# Patient Record
Sex: Female | Born: 1990 | Race: Black or African American | Hispanic: No | Smoking: Never smoker
Health system: Southern US, Community
[De-identification: ages and names within clinical notes are randomized; demographics above are authoritative.]

## PROBLEM LIST (undated history)

## (undated) DIAGNOSIS — F1210 Cannabis abuse, uncomplicated: Secondary | ICD-10-CM

## (undated) DIAGNOSIS — R569 Unspecified convulsions: Secondary | ICD-10-CM

## (undated) DIAGNOSIS — G43019 Migraine without aura, intractable, without status migrainosus: Secondary | ICD-10-CM

## (undated) DIAGNOSIS — R51 Headache: Secondary | ICD-10-CM

## (undated) DIAGNOSIS — R519 Headache, unspecified: Secondary | ICD-10-CM

## (undated) HISTORY — DX: Migraine without aura, intractable, without status migrainosus: G43.019

## (undated) HISTORY — DX: Unspecified convulsions: R56.9

---
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
## 2011-08-30 LAB — URINALYSIS, ROUTINE W REFLEX MICROSCOPIC
Glucose, UA: NEGATIVE
Leukocytes, UA: NEGATIVE
Protein, ur: NEGATIVE
Specific Gravity, Urine: 1.02
pH: 7

## 2011-08-30 LAB — CBC
HCT: 35.7
Hemoglobin: 12
RBC: 4.25
WBC: 5.1

## 2011-08-30 LAB — DIFFERENTIAL
Basophils Absolute: 0
Eosinophils Relative: 1
Lymphocytes Relative: 40
Lymphs Abs: 2
Monocytes Absolute: 0.5
Neutro Abs: 2.4

## 2011-08-30 LAB — URINE MICROSCOPIC-ADD ON

## 2011-08-30 LAB — BASIC METABOLIC PANEL
Calcium: 9.6
Potassium: 3.9
Sodium: 135

## 2013-12-09 ENCOUNTER — Encounter: Payer: Self-pay | Admitting: Clinical

## 2013-12-09 NOTE — Progress Notes (Signed)
A user error has taken place: encounter opened in error, closed for administrative reasons.

## 2014-03-26 ENCOUNTER — Encounter (HOSPITAL_COMMUNITY): Payer: Self-pay | Admitting: Emergency Medicine

## 2014-03-26 ENCOUNTER — Emergency Department (HOSPITAL_COMMUNITY)
Admission: EM | Admit: 2014-03-26 | Discharge: 2014-03-26 | Disposition: A | Discharge status: Home / Self Care | Payer: Self-pay | Attending: Emergency Medicine | Admitting: Emergency Medicine

## 2014-03-26 DIAGNOSIS — J040 Acute laryngitis: Secondary | ICD-10-CM | POA: Insufficient documentation

## 2014-03-26 DIAGNOSIS — Z791 Long term (current) use of non-steroidal anti-inflammatories (NSAID): Secondary | ICD-10-CM | POA: Insufficient documentation

## 2014-03-26 MED ORDER — PREDNISONE 20 MG PO TABS: 40.0000 mg | ORAL_TABLET | Freq: Every day | ORAL | Status: DC

## 2014-03-26 MED ORDER — PREDNISONE 20 MG PO TABS
60.0000 mg | ORAL_TABLET | Freq: Once | ORAL | Status: AC
  Administered 2014-03-26: 60 mg via ORAL
  Filled 2014-03-26: qty 3

## 2014-03-26 NOTE — Discharge Instructions (Signed)
Perform voice rest and stay well hydrated.   Do not hesitate to return to the Emergency Department for any new, worsening or concerning symptoms.   If you do not have a primary care doctor you can establish one at the   [facility]: [address] [place] [id number] [phone]  After you establish care. Let them know you were seen in the emergency room. They must obtain records for further management.    Laryngitis At the top of your windpipe is your voice box. It is the source of your voice. Inside your voice box are 2 bands of muscles called vocal cords. When you breathe, your vocal cords are relaxed and open so that air can get into the lungs. When you decide to say something, these cords come together and vibrate. The sound from these vibrations goes into your throat and comes out through your mouth as sound. Laryngitis is an inflammation of the vocal cords that causes hoarseness, cough, loss of voice, sore throat, and dry throat. Laryngitis can be temporary (acute) or long-term (chronic). Most cases of acute laryngitis improve with time.Chronic laryngitis lasts for more than 3 weeks. CAUSES Laryngitis can often be related to excessive smoking, talking, or yelling, as well as inhalation of toxic fumes and allergies. Acute laryngitis is usually caused by a viral infection, vocal strain, measles or mumps, or bacterial infections. Chronic laryngitis is usually caused by vocal cord strain, vocal cord injury, postnasal drip, growths on the vocal cords, or acid reflux. SYMPTOMS   Cough.  Sore throat.  Dry throat. RISK FACTORS  Respiratory infections.  Exposure to irritating substances, such as cigarette smoke, excessive amounts of alcohol, stomach acids, and workplace chemicals.  Voice trauma, such as vocal cord injury from shouting or speaking too loud. DIAGNOSIS  Your cargiver will perform a physical exam. During the physical exam, your caregiver will examine  your throat. The most common sign of laryngitis is hoarseness. Laryngoscopy may be necessary to confirm the diagnosis of this condition. This procedure allows your caregiver to look into the larynx. HOME CARE INSTRUCTIONS  Drink enough fluids to keep your urine clear or pale yellow.  Rest until you no longer have symptoms or as directed by your caregiver.  Breathe in moist air.  Take all medicine as directed by your caregiver.  Do not smoke.  Talk as little as possible (this includes whispering).  Write on paper instead of talking until your voice is back to normal.  Follow up with your caregiver if your condition has not improved after 10 days. SEEK MEDICAL CARE IF:   You have trouble breathing.  You cough up blood.  You have persistent fever.  You have increasing pain.  You have difficulty swallowing. MAKE SURE YOU:  Understand these instructions.  Will watch your condition.  Will get help right away if you are not doing well or get worse. Document Released: 11/07/2005 Document Revised: 01/30/2012 Document Reviewed: 01/13/2011 [facility] Patient Information 2014 [place] [place].

## 2014-03-26 NOTE — ED Notes (Signed)
Hoarse for 3-4 days.  No pain when eating no cold or cough.  No distress

## 2014-03-26 NOTE — ED Provider Notes (Signed)
CSN: [id number]     Arrival date & time 03/26/14  1753 History   First MD Initiated Contact with Patient 03/26/14 1802 This chart was scribed for non-physician practitioner Wynetta EmeryNicole Rogelio Winbush, PA-C working with Merrie RoofJohn David Wofford III, * by Valera CastleSteven Perry, ED scribe. This patient was seen in room TR05C/TR05C and the patient's care was started at 6:39 PM.     Chief Complaint  Patient presents with  . Hoarse   (Consider location/radiation/quality/duration/timing/severity/associated sxs/prior Treatment) The history is provided by the patient. No language interpreter was used.   HPI Comments: Michelle Short is a 23 y.o. female who presents to the Emergency Department complaining of gradual loss of voice, onset 3 days ago. She reports associated soreness to the sides of her throat. 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 She denies h/o similar symptoms. She denies being on any daily medications. She denies cough, fever, rhinorrhea, and any other associated symptoms.   PCP - No primary provider on file.  History reviewed. No pertinent past medical history. History reviewed. No pertinent past surgical history. No family history on file. History  Substance Use Topics  . Smoking status: Never Smoker   . Smokeless tobacco: Not on file  . Alcohol Use: Yes   OB History   Grav Para Term Preterm Abortions TAB SAB Ect Mult Living                 Review of Systems  Constitutional: Negative for fever.  HENT: Positive for sore throat (bilateral soreness) and voice change (loss of voice). Negative for rhinorrhea.   Respiratory: Negative for cough.    Allergies  Review of patient's allergies indicates no known allergies.  Home Medications   Prior to Admission medications   Medication Sig Start Date End Date Taking? Authorizing Provider  naproxen sodium (ANAPROX) 220 MG tablet Take 440 mg by mouth 2 (two) times daily with a meal.   Yes Historical Provider, MD   BP 122/75  Pulse 90  Temp(Src) 98.3  F (36.8 C) (Oral)  Resp 20  SpO2 97%  LMP 03/19/2014  Physical Exam  Nursing note and vitals reviewed. Constitutional: She is oriented to person, place, and time. She appears well-developed and well-nourished. No distress.  HENT:  Head: Normocephalic and atraumatic.  Mouth/Throat: Oropharynx is clear and moist.  Mild erythema, no tonsillar hypertrophy or exudate, soft palate rises symmetrically. Uvula is midline.  Eyes: Conjunctivae and EOM are normal. Pupils are equal, round, and reactive to light.  Neck: Normal range of motion. Neck supple.  Shotty, bilateral, nontender lymphadenopathy in the anterior cervical regions  Cardiovascular: Normal rate, regular rhythm and intact distal pulses.   Pulmonary/Chest: Effort normal. No respiratory distress. She has no wheezes. She has no rales. She exhibits no tenderness.  Abdominal: Soft. She exhibits no distension and no mass. There is no tenderness. There is no rebound and no guarding.  Musculoskeletal: Normal range of motion.  Lymphadenopathy:    She has cervical adenopathy.  Neurological: She is alert and oriented to person, place, and time.  Skin: Skin is warm and dry.  Psychiatric: She has a normal mood and affect. Her behavior is normal.   ED Course  Procedures (including critical care time)  DIAGNOSTIC STUDIES: Oxygen Saturation is 97% on room air, normal by my interpretation.    COORDINATION OF CARE: 6:41 PM-[facility] and pt agreed to plan. Will give pt note for work, but  strongly advised pt to refrain from speaking until symptoms resolve.   Labs Review Labs Reviewed - No data to display  Imaging Review No results found.   EKG Interpretation None     Medications - No data to display MDM   Final diagnoses:  None    Filed Vitals:   03/26/14 1800  BP: 122/75  Pulse: 90  Temp: 98.3 F (36.8 C)  TempSrc: Oral  Resp: 20  SpO2: 97%     Medications  predniSONE (DELTASONE) tablet 60 mg (60 mg Oral Given 03/26/14 1853)    Michelle Short is a 23 y.o. female presenting with laryngitis. 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 Advised strict voice rest, aggressive hydration and will do prednisone burst as well.  Evaluation does not show pathology that would require ongoing emergent intervention or inpatient treatment. Pt is hemodynamically stable and mentating appropriately. Discussed findings and plan with patient/guardian, who agrees with care plan. All questions answered. Return precautions discussed and outpatient follow up given.   Discharge Medication List as of 03/26/2014  6:49 PM    START taking these medications   Details  predniSONE (DELTASONE) 20 MG tablet Take 2 tablets (40 mg total) by mouth daily., Starting 03/26/2014, Until Discontinued, Print        Note: Portions of this report may have been transcribed using voice recognition software. Every effort was made to ensure accuracy; however, inadvertent computerized transcription errors may be present   I personally performed the services described in this documentation, which was scribed in my presence. The recorded information has been reviewed and is accurate.    Wynetta Emeryicole Jamar Casagrande, PA-C 03/27/14 1258

## 2014-03-26 NOTE — ED Notes (Signed)
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

## 2014-03-28 NOTE — ED Provider Notes (Signed)
Medical screening examination/treatment/procedure(s) were performed by non-physician practitioner and as supervising physician I was immediately available for consultation/collaboration.   Ceanna Wareing David Merilyn Pagan III, MD 03/28/14 1232 

## 2014-11-11 ENCOUNTER — Encounter (HOSPITAL_COMMUNITY): Payer: Self-pay

## 2014-11-11 ENCOUNTER — Emergency Department (HOSPITAL_COMMUNITY)
Admission: EM | Admit: 2014-11-11 | Discharge: 2014-11-11 | Disposition: A | Discharge status: Home / Self Care | Payer: Self-pay | Attending: Emergency Medicine | Admitting: Emergency Medicine

## 2014-11-11 DIAGNOSIS — R1111 Vomiting without nausea: Secondary | ICD-10-CM

## 2014-11-11 DIAGNOSIS — E876 Hypokalemia: Secondary | ICD-10-CM | POA: Insufficient documentation

## 2014-11-11 DIAGNOSIS — R1031 Right lower quadrant pain: Secondary | ICD-10-CM | POA: Insufficient documentation

## 2014-11-11 DIAGNOSIS — R1032 Left lower quadrant pain: Secondary | ICD-10-CM | POA: Insufficient documentation

## 2014-11-11 DIAGNOSIS — Z7952 Long term (current) use of systemic steroids: Secondary | ICD-10-CM | POA: Insufficient documentation

## 2014-11-11 DIAGNOSIS — Z791 Long term (current) use of non-steroidal anti-inflammatories (NSAID): Secondary | ICD-10-CM | POA: Insufficient documentation

## 2014-11-11 DIAGNOSIS — R112 Nausea with vomiting, unspecified: Secondary | ICD-10-CM | POA: Insufficient documentation

## 2014-11-11 LAB — CBC WITH DIFFERENTIAL/PLATELET
Basophils Absolute: 0 10*3/uL (ref 0.0–0.1)
Basophils Relative: 0 % (ref 0–1)
EOS ABS: 0 10*3/uL (ref 0.0–0.7)
Eosinophils Relative: 0 % (ref 0–5)
HCT: 38.9 % (ref 36.0–46.0)
Hemoglobin: 12.9 g/dL (ref 12.0–15.0)
LYMPHS ABS: 1 10*3/uL (ref 0.7–4.0)
Lymphocytes Relative: 10 % — ABNORMAL LOW (ref 12–46)
MCH: 28.9 pg (ref 26.0–34.0)
MCHC: 33.2 g/dL (ref 30.0–36.0)
MCV: 87 fL (ref 78.0–100.0)
Monocytes Absolute: 0.4 10*3/uL (ref 0.1–1.0)
Monocytes Relative: 4 % (ref 3–12)
NEUTROS PCT: 86 % — AB (ref 43–77)
Neutro Abs: 8.5 10*3/uL — ABNORMAL HIGH (ref 1.7–7.7)
PLATELETS: 228 10*3/uL (ref 150–400)
RBC: 4.47 MIL/uL (ref 3.87–5.11)
RDW: 12 % (ref 11.5–15.5)
WBC: 9.8 10*3/uL (ref 4.0–10.5)

## 2014-11-11 LAB — URINALYSIS, ROUTINE W REFLEX MICROSCOPIC
BILIRUBIN URINE: NEGATIVE
Glucose, UA: NEGATIVE mg/dL
KETONES UR: NEGATIVE mg/dL
Leukocytes, UA: NEGATIVE
NITRITE: NEGATIVE
PROTEIN: NEGATIVE mg/dL
Specific Gravity, Urine: 1.021 (ref 1.005–1.030)
UROBILINOGEN UA: 0.2 mg/dL (ref 0.0–1.0)
pH: 5.5 (ref 5.0–8.0)

## 2014-11-11 LAB — URINE MICROSCOPIC-ADD ON

## 2014-11-11 LAB — BASIC METABOLIC PANEL
ANION GAP: 9 (ref 5–15)
BUN: 7 mg/dL (ref 6–23)
CO2: 24 mmol/L (ref 19–32)
Calcium: 9.6 mg/dL (ref 8.4–10.5)
Chloride: 105 mEq/L (ref 96–112)
Creatinine, Ser: 0.88 mg/dL (ref 0.50–1.10)
GLUCOSE: 82 mg/dL (ref 70–99)
POTASSIUM: 2.9 mmol/L — AB (ref 3.5–5.1)
SODIUM: 138 mmol/L (ref 135–145)

## 2014-11-11 LAB — I-STAT BETA HCG BLOOD, ED (MC, WL, AP ONLY)

## 2014-11-11 MED ORDER — NAPROXEN 500 MG PO TABS: 500.0000 mg | ORAL_TABLET | Freq: Two times a day (BID) | ORAL | Status: DC

## 2014-11-11 MED ORDER — ONDANSETRON HCL 4 MG/2ML IJ SOLN
4.0000 mg | Freq: Once | INTRAMUSCULAR | Status: AC
  Administered 2014-11-11: 4 mg via INTRAVENOUS
  Filled 2014-11-11: qty 2

## 2014-11-11 MED ORDER — POTASSIUM CHLORIDE CRYS ER 20 MEQ PO TBCR
40.0000 meq | EXTENDED_RELEASE_TABLET | Freq: Once | ORAL | Status: AC
  Administered 2014-11-11: 40 meq via ORAL
  Filled 2014-11-11: qty 2

## 2014-11-11 MED ORDER — PROMETHAZINE HCL 25 MG PO TABS: 25.0000 mg | ORAL_TABLET | Freq: Four times a day (QID) | ORAL | Status: DC | PRN

## 2014-11-11 MED ORDER — SODIUM CHLORIDE 0.9 % IV BOLUS (SEPSIS)
1000.0000 mL | Freq: Once | INTRAVENOUS | Status: AC
  Administered 2014-11-11: 1000 mL via INTRAVENOUS

## 2014-11-11 MED ORDER — POTASSIUM CHLORIDE ER 10 MEQ PO TBCR: 20.0000 meq | EXTENDED_RELEASE_TABLET | Freq: Every day | ORAL | Status: DC

## 2014-11-11 MED ORDER — KETOROLAC TROMETHAMINE 15 MG/ML IJ SOLN
15.0000 mg | Freq: Once | INTRAMUSCULAR | Status: AC
  Administered 2014-11-11: 15 mg via INTRAVENOUS
  Filled 2014-11-11: qty 1

## 2014-11-11 NOTE — ED Notes (Signed)
Pt reports she started her period this morning at 0300 and has had extreme abdominal cramping and vomiting, hx of same when she starts. Pt tearful.

## 2014-11-11 NOTE — ED Notes (Signed)
PA at bedside.

## 2014-11-11 NOTE — Discharge Instructions (Signed)
Do not hesitate to return to the emergency room for any new, worsening or concerning symptoms.  Please obtain primary care using resource guide below. But the minute you were seen in the emergency room and that they will need to obtain records for further outpatient management.   Abdominal Pain Many things can cause abdominal pain. Usually, abdominal pain is not caused by a disease and will improve without treatment. It can often be observed and treated at home. Your health care provider will do a physical exam and possibly order blood tests and X-rays to help determine the seriousness of your pain. However, in many cases, more time must pass before a clear cause of the pain can be found. Before that point, your health care provider may not know if you need more testing or further treatment. HOME CARE INSTRUCTIONS  Monitor your abdominal pain for any changes. The following actions may help to alleviate any discomfort you are experiencing:  Only take over-the-counter or prescription medicines as directed by your health care provider.  Do not take laxatives unless directed to do so by your health care provider.  Try a clear liquid diet (broth, tea, or water) as directed by your health care provider. Slowly move to a bland diet as tolerated. SEEK MEDICAL CARE IF:  You have unexplained abdominal pain.  You have abdominal pain associated with nausea or diarrhea.  You have pain when you urinate or have a bowel movement.  You experience abdominal pain that wakes you in the night.  You have abdominal pain that is worsened or improved by eating food.  You have abdominal pain that is worsened with eating fatty foods.  You have a fever. SEEK IMMEDIATE MEDICAL CARE IF:   Your pain does not go away within 2 hours.  You keep throwing up (vomiting).  Your pain is felt only in portions of the abdomen, such as the right side or the left lower portion of the abdomen.  You pass bloody or black  tarry stools. MAKE SURE YOU:  Understand these instructions.   Will watch your condition.   Will get help right away if you are not doing well or get worse.  Document Released: 08/17/2005 Document Revised: 11/12/2013 Document Reviewed: 07/17/2013 [facility] Patient Information 2015 [employer], [place]. This information is not intended to replace advice given to you by your health care provider. Make sure you discuss any questions you have with your health care provider.   Emergency Department Resource Guide 1) Find a Doctor and Pay Out of Pocket Although you won't have to find out who is covered by your insurance plan, it is a good idea to ask around and get recommendations. You will then need to call the office and see if the doctor you have chosen will accept you as a new patient and what types of options they offer for patients who are self-pay. Some doctors offer discounts or will set up payment plans for their patients who do not have insurance, but you will need to ask so you aren't surprised when you get to your appointment.  2) Contact Your Local Health Department Not all health departments have doctors that can see patients for sick visits, but many do, so it is worth a call to see if yours does. If you don't know where your local health department is, you can check in your phone book. The CDC also has a tool to help you locate your state's health department, and many state websites also have listings  of all of their local health departments.  3) Find a Walk-in Clinic If your illness is not likely to be very severe or complicated, you may want to try a walk in clinic. These are popping up all over the country in pharmacies, drugstores, and shopping centers. They're usually staffed by nurse practitioners or physician assistants that have been trained to treat common illnesses and complaints. They're usually fairly quick and inexpensive. However, if you have serious medical issues or chronic  medical problems, these are probably not your best option.  No Primary Care Doctor: - Call Health Connect at  [phone] - they can help you locate a primary care doctor that  accepts your insurance, provides certain services, etc. - Physician Referral Service- [phone]  Chronic Pain Problems: Organization         Address  Phone   Notes  [facility]  [phone] Patients need to be referred by their primary care doctor.   Medication Assistance: Organization         Address  Phone   Notes  [facility] Medication [facility] [address] [place], [place] [address] [phone] --Must be a resident of [facility] -- Must have NO insurance coverage whatsoever (no Medicaid/ Medicare, etc.) -- The pt. MUST have a primary care doctor that directs their care regularly and follows them in the community   MedAssist  [phone]   [employer]  [phone]) [phone]    Agencies that provide inexpensive medical care: Organization         Address  Phone   Notes  [facility]  [phone]   [facility]    [phone]) [phone]   [facility] [address] [place], [place] [address] [phone]) [phone]   [facility] [address], [place] [phone]) [phone]   [facility]anned [facility]    [phone]) [phone]   [facility]    [phone]) [phone]   [facility]  [address], [place] Phone:  [phone], Fax:  [phone] Hours of Operation:  9 am - 6 pm, M-F.  Also accepts Medicaid/Medicare and self-pay.  [facility]  [address], [place] Phone: [phone]) [phone], Fax: [phone]) [phone]. Hours of Operation:  8:30 am - 5:30 pm, M-F.  Also accepts Medicaid and self-pay.  [facility] [place] [address], [place] Phone: [phone]) [phone]   [facility] [address], [place], [place] [phone],  Ext. [phone] Mondays & Thursdays: 7-9 AM.  First 15 patients are seen on a first come, first serve basis.    Medicaid-accepting [facility] Providers:  Organization         Address  Phone   Notes  [facility] [address],  [phone]) [phone] Also accepts self-pay patients.  [facility] [address], [place]  [phone]) [phone]   [facility] [address], [place] [phone]) [phone]   [facility] [address] [place] [address], [place] [phone]) [phone]   Renaye RakersVeita Bland [address], [place]   [phone] Only accepts [place] Access [place] patients after they have their name applied to their card.   Self-Pay (no insurance) in [facility] [place]:  Retail buyerrganization         Address  Phone   Notes  Sickle Cell Patients, [facility] [address] [place], [place] [phone]   [facility] [address] [place], [place] [phone]   [facility]  [address],  [phone]   [facility]/Dr. Osei-Bonsu  [address], [place] or [address], [place] [phone] Phone number for both [place] and [place] locations is the same.  [facility] [address], [place] [phone]   [facility] [address], [place] or [address] [phone]   [facility] [address] [place] [phone], phone; [phone], fax Sees patients 1st and 3rd Saturday of every month.  Must not qualify for public or private insurance (i.e. Medicaid, Medicare, [place] Health Choice, Veterans' Benefits)  Household income should be no more than 200% of the poverty level The clinic cannot treat you if you are pregnant or think you are pregnant  Sexually transmitted diseases are not  treated at the clinic.    Dental Care: Organization         Address  Phone  Notes  [facility] [address] [place], [place] [phone] Accepts children up to age 6 who are enrolled in [place] or [place] Health Choice; pregnant women with a Medicaid card; and children who have applied for Medicaid or [place] Health Choice, but were declined, whose parents can pay a reduced fee at time of service.  [facility] Department of [facility]  [address], [place] [phone] Accepts children up to age 15 who are enrolled in [place] or [place] Health Choice; pregnant women with a Medicaid card; and children who have applied for Medicaid or [place] Health Choice, but were declined, whose parents can pay a reduced fee at time of service.  [facility]  [address] [place], [place] [phone] Patients are seen by appointment only. Walk-ins are not accepted. [facility] will see patients 51 years of age and older. Monday - Tuesday (8am-5pm) Most Wednesdays (8:30-5pm) $30 per visit, cash only  [facility] Access PRO[facility]  [address], [facility] [phone] Patients are seen by appointment only. Walk-ins are not accepted. [facility] will see patients 34 years of age and older. One Wednesday Evening (Monthly: Volunteer Based).  $30 per visit, cash only  [employer]  [phone] for adults; Children under age 34, call Graduate Pediatric Dentistry at [phone]. Children aged 64-14, please call [phone] to request a pediatric application.  Dental services are provided in all areas of dental care including fillings, crowns and bridges, complete and partial dentures, implants, gum treatment, root canals, and extractions. Preventive care is also provided. Treatment is provided to both adults and children. Patients are selected via a lottery and there is  often a waiting list.   [facility] [address], [place]  [phone] [email or web address]   [facility] [address] [place], [place] [phone], Ext. [phone] Second and Fourth Thursday of each month, opens at 6:30 AM; Clinic ends at 9 AM.  Patients are seen on a first-come first-served basis, and a limited number are seen during each clinic.   [facility]  [address] [place] [place], [place] [phone]   Eligibility  Requirements You must have lived in [place], or [place] counties for at least the last three months.   You cannot be eligible for state or federal sponsored [place], including [employer], [place], or [employer].   You generally cannot be eligible for healthcare insurance through your employer.    How to apply: Eligibility screenings are held every Tuesday and Wednesday afternoon from 1:00 pm until 4:00 pm. You do not need an appointment for the interview!  [facility] [address], [place], [place] [phone]   [facility] Health Department  [phone]   [facility] Health Department  [phone]   [facility] Health Department  [phone]    Behavioral Health Resources in the Community: Intensive Outpatient Programs Organization         Address  Phone  Notes  [facility] [address] [place], [place] [phone]   [facility] [address], [place], [place] [phone]   [facility] [address], [place], [place]  [phone]   [facility] Health [address],  [place], [place] [phone] or [phone]   Substance Abuse Resources Organization         Address  Phone  Notes  [facility]  [phone]   [facility]  [phone]   [facility] [place]  [phone]   Floydene Flock  [phone]   Residential & Outpatient Substance  Abuse Program  [phone]   Psychological Services Organization         Address  Phone  Notes  [facility]  [phone]   [facility] Services  [phone]   [facility] Mental Health [address], [place] [phone] or [phone]    Mobile Crisis Teams Organization         Address  Phone  Notes  [facility], Mobile Crisis Care Unit  [phone]   [facility]  [address] [place], [place] [phone]   Doristine Locks [address] [place] [phone]    Self-Help/Support Groups Organization         Address  Phone             Notes  [facility] [place] - variety of support groups  336- I7437963 Call for more information  Narcotics Anonymous (NA), [facility] [address], [employer] [place]  2 meetings at this location   Statistician         Address  Phone  Notes  [facility] [address],    [place]  [phone]   [facility]  [address] [place] [address], [place], [place] [phone]   [facility] [address] [place], [place] [phone] Admissions: 8am-3pm M-F  [facility] [address]    [place], [place] [phone]   [facility] [address] [place], [place] [phone]   [facility] [address]  [place], [place] [phone]   [facility] - Intensive Outpatient [address], [place], [place] [phone]   [facility] ([facility] [address]  [place], [place] [phone] or [phone]   [facility] ([facility]) [address] [place], [place] [phone] Accepts Medicaid  [facility] [address],  [place] [phone] Substance Abuse/Addiction Treatment   [facility] Resources Organization  Address  Phone  Notes  °[facility]  (888) [phone]   °Julie Brannon, PhD [address] [place], [place]   ([phone]) [phone] or (336) 951-0000   °[facility]   [address] °[place], [place] ([phone]) [phone]   °[facility] [address], [place], [place] ([phone]) [phone] Insurance/Medicaid/sponsorship through [facility]  °[facility] [address]                                    [place], [place] [phone]) [phone] Therapy/tele-psych/case  °[facility] [address]  ° [place], [place] ([phone]) [phone]    °Dr. Arfeen  ([phone]) [phone]   °[facility] [place]  [employer] [place] [facility] 1) [address], [place] °2) [address], [place] °3)  [address], [place] ([phone]) [phone] °(336) [phone] ° °(336) [phone]   °[place] Child Abuse Hotline (336) 342-1394 or (336) 342-3537 (After Hours)    ° ° ° °

## 2014-11-11 NOTE — ED Provider Notes (Signed)
CSN: [id number]     Arrival date & time 11/11/14  [id number] History   None    Chief Complaint  Patient presents with  . Abdominal Pain     (Consider location/radiation/quality/duration/timing/severity/associated sxs/prior Treatment) HPI   Michelle Short is a 23 y.o. female complaining of severe bilateral lower abdominal pain which she describes as cramping associated with onset of her menses this morning at 3 AM also associated with multiple episodes of nonbloody, nonbilious, no coffee-ground emesis. Patient took ibuprofen with no relief: She think she vomited it up. States that this is typical for her menstruation states that the cramping is getting progressively worse. States that the bleeding is heavy, she does not have a OB/GYN. Patient rates her pain as 10 out of 10, nonlateralizing. No associated abnormal vaginal discharge.  History reviewed. No pertinent past medical history. History reviewed. No pertinent past surgical history. No family history on file. History  Substance Use Topics  . Smoking status: Never Smoker   . Smokeless tobacco: Not on file  . Alcohol Use: Yes   OB History    No data available     Review of Systems  10 systems reviewed and found to be negative, except as noted in the HPI.   Allergies  Review of patient's allergies indicates no known allergies.  Home Medications   Prior to Admission medications   Medication Sig Start Date End Date Taking? Authorizing Provider  naproxen sodium (ANAPROX) 220 MG tablet Take 440 mg by mouth 2 (two) times daily with a meal.    Historical Provider, MD  predniSONE (DELTASONE) 20 MG tablet Take 2 tablets (40 mg total) by mouth daily. 03/26/14   Wynetta EmeryNicole Indy Kuck, PA-C   LMP 11/11/2014 Physical Exam  Constitutional: She is oriented to person, place, and time. She appears well-developed and well-nourished. No distress.  Hunched over, tearful  HENT:  Head: Normocephalic and atraumatic.  Mouth/Throat: Oropharynx is  clear and moist.  Eyes: Conjunctivae and EOM are normal. Pupils are equal, round, and reactive to light.  Neck: Normal range of motion.  Cardiovascular: Normal rate, regular rhythm and intact distal pulses.   Pulmonary/Chest: Effort normal and breath sounds normal. No stridor. No respiratory distress. She has no wheezes. She has no rales. She exhibits no tenderness.  Abdominal: Soft. Bowel sounds are normal. She exhibits no distension and no mass. There is tenderness. There is no rebound and no guarding.  Tender to deep palpation of bilateral lower quadrants, no guarding or rebound.  Musculoskeletal: Normal range of motion.  Neurological: She is alert and oriented to person, place, and time.  Psychiatric: She has a normal mood and affect.  Nursing note and vitals reviewed.   ED Course  Procedures (including critical care time) Labs Review Labs Reviewed  CBC WITH DIFFERENTIAL  BASIC METABOLIC PANEL  URINALYSIS, ROUTINE W REFLEX MICROSCOPIC  I-STAT BETA HCG BLOOD, ED ([facility], [facility], [facility] ONLY)    Imaging Review No results found.   EKG Interpretation None      MDM   Final diagnoses:  Bilateral lower abdominal pain  Non-intractable vomiting without nausea, vomiting of unspecified type  Hypokalemia   Filed Vitals:   11/11/14 0651 11/11/14 0653 11/11/14 0700 11/11/14 0800  BP: 109/66  117/73 113/81  Pulse: 63  66 89  Temp: 98.3 F (36.8 C)     TempSrc: Oral     Resp: 18   16  Height:  5\' 4"  (1.626 m)    Weight:  104 lb (47.174  kg)    SpO2: 98%  100% 100%    Medications  sodium chloride 0.9 % bolus 1,000 mL (1,000 mLs Intravenous New Bag/Given 11/11/14 0700)  ondansetron (ZOFRAN) injection 4 mg (4 mg Intravenous Given 11/11/14 0700)  ketorolac (TORADOL) 15 MG/ML injection 15 mg (15 mg Intravenous Given 11/11/14 0754)  potassium chloride SA (K-DUR,KLOR-CON) CR tablet 40 mEq (40 mEq Oral Given 11/11/14 0754)    Michelle Short is a pleasant 23 y.o. female presenting with  severe lower abdominal pain which she associates with premenstrual cramping. Patient started her menses this a.m. She has had several episodes of vomiting. Patient states that this is not atypical for her PMS. Blood work shows a low potassium at 2.9. She is given Toradol in the ED with significant relief of pain. She is tolerating by mouth and serial abdominal exams are improving with no tenderness to palpation, guarding or rebound.  Evaluation does not show pathology that would require ongoing emergent intervention or inpatient treatment. Pt is hemodynamically stable and mentating appropriately. Discussed findings and plan with patient/guardian, who agrees with care plan. All questions answered. Return precautions discussed and outpatient follow up given.   Discharge Medication List as of 11/11/2014  9:30 AM    START taking these medications   Details  naproxen (NAPROSYN) 500 MG tablet Take 1 tablet (500 mg total) by mouth 2 (two) times daily with a meal., Starting 11/11/2014, Until Discontinued, Print    potassium chloride (K-DUR) 10 MEQ tablet Take 2 tablets (20 mEq total) by mouth daily., Starting 11/11/2014, Until Discontinued, Print    promethazine (PHENERGAN) 25 MG tablet Take 1 tablet (25 mg total) by mouth every 6 (six) hours as needed for nausea or vomiting., Starting 11/11/2014, Until Discontinued, [employer]           Jaydon Soroka, PA-C 11/11/14 1215  Geoffery Lyonsouglas Delo, MD 11/12/14 [id number]

## 2016-10-24 ENCOUNTER — Encounter (HOSPITAL_COMMUNITY): Payer: Self-pay | Admitting: Emergency Medicine

## 2016-10-24 ENCOUNTER — Emergency Department (HOSPITAL_COMMUNITY)
Admission: EM | Admit: 2016-10-24 | Discharge: 2016-10-24 | Disposition: A | Discharge status: Home / Self Care | Payer: Self-pay | Attending: Emergency Medicine | Admitting: Emergency Medicine

## 2016-10-24 DIAGNOSIS — Y999 Unspecified external cause status: Secondary | ICD-10-CM | POA: Insufficient documentation

## 2016-10-24 DIAGNOSIS — S60561A Insect bite (nonvenomous) of right hand, initial encounter: Secondary | ICD-10-CM | POA: Insufficient documentation

## 2016-10-24 DIAGNOSIS — S0086XA Insect bite (nonvenomous) of other part of head, initial encounter: Secondary | ICD-10-CM | POA: Insufficient documentation

## 2016-10-24 DIAGNOSIS — W57XXXA Bitten or stung by nonvenomous insect and other nonvenomous arthropods, initial encounter: Secondary | ICD-10-CM | POA: Insufficient documentation

## 2016-10-24 DIAGNOSIS — F17200 Nicotine dependence, unspecified, uncomplicated: Secondary | ICD-10-CM | POA: Insufficient documentation

## 2016-10-24 DIAGNOSIS — S30861A Insect bite (nonvenomous) of abdominal wall, initial encounter: Secondary | ICD-10-CM | POA: Insufficient documentation

## 2016-10-24 DIAGNOSIS — S60562A Insect bite (nonvenomous) of left hand, initial encounter: Secondary | ICD-10-CM | POA: Insufficient documentation

## 2016-10-24 DIAGNOSIS — Z79899 Other long term (current) drug therapy: Secondary | ICD-10-CM | POA: Insufficient documentation

## 2016-10-24 DIAGNOSIS — Y929 Unspecified place or not applicable: Secondary | ICD-10-CM | POA: Insufficient documentation

## 2016-10-24 DIAGNOSIS — Z791 Long term (current) use of non-steroidal anti-inflammatories (NSAID): Secondary | ICD-10-CM | POA: Insufficient documentation

## 2016-10-24 DIAGNOSIS — Y939 Activity, unspecified: Secondary | ICD-10-CM | POA: Insufficient documentation

## 2016-10-24 MED ORDER — DEXAMETHASONE 10 MG/ML FOR PEDIATRIC ORAL USE
10.0000 mg | Freq: Once | INTRAMUSCULAR | Status: AC
  Administered 2016-10-24: 10 mg via ORAL
  Filled 2016-10-24: qty 1

## 2016-10-24 MED ORDER — DEXAMETHASONE 4 MG PO TABS
4.0000 mg | ORAL_TABLET | Freq: Two times a day (BID) | ORAL | 0 refills | Status: DC
Start: 2016-10-24 — End: 2018-01-12

## 2016-10-24 MED ORDER — PERMETHRIN 5 % EX CREA: TOPICAL_CREAM | CUTANEOUS | 1 refills | Status: DC

## 2016-10-24 MED ORDER — ONDANSETRON HCL 4 MG PO TABS
4.0000 mg | ORAL_TABLET | Freq: Once | ORAL | Status: AC
  Administered 2016-10-24: 4 mg via ORAL
  Filled 2016-10-24: qty 1

## 2016-10-24 MED ORDER — HYDROXYZINE HCL 25 MG PO TABS
25.0000 mg | ORAL_TABLET | Freq: Once | ORAL | Status: AC
  Administered 2016-10-24: 25 mg via ORAL
  Filled 2016-10-24: qty 1

## 2016-10-24 NOTE — ED Notes (Signed)
Called to triage x 1with no answer  

## 2016-10-24 NOTE — ED Provider Notes (Signed)
[facility]-EMERGENCY DEPT Provider Note   CSN: [id number] Arrival date & time: 10/24/16  1923     History   Chief Complaint Chief Complaint  Patient presents with  . Insect Bite    HPI Michelle Short is a 25 y.o. female.  Patient is a 25 year old female who presents to the emergency department with insect bites.  The patient states that she thinks she may have picked up bedbugs from a relative's home. She states she stayed at a relative's home for couple of nights and on last night she began to notice a line of bumps on her face, and then it was followed by a couple more on the opposite side of her face, she then noticed some on her left abdomen and some on both hands. She states she has a lot of itching, and some swelling. There's been no shortness of breath, no difficulty with breathing, no difficulty with speaking, no difficulty with swallowing. She has not taken anything for this issue.      History reviewed. No pertinent past medical history.  There are no active problems to display for this patient.   History reviewed. No pertinent surgical history.  OB History    No data available       Home Medications    Prior to Admission medications   Medication Sig Start Date End Date Taking? Authorizing Provider  ibuprofen (ADVIL,MOTRIN) 200 MG tablet Take 200 mg by mouth every 6 (six) hours as needed for mild pain.    Historical Provider, MD  naproxen (NAPROSYN) 500 MG tablet Take 1 tablet (500 mg total) by mouth 2 (two) times daily with a meal. 11/11/14   Nicole Pisciotta, PA-C  naproxen sodium (ANAPROX) 220 MG tablet Take 440 mg by mouth 2 (two) times daily with a meal.    Historical Provider, MD  potassium chloride (K-DUR) 10 MEQ tablet Take 2 tablets (20 mEq total) by mouth daily. 11/11/14   Nicole Pisciotta, PA-C  promethazine (PHENERGAN) 25 MG tablet Take 1 tablet (25 mg total) by mouth every 6 (six) hours as needed for nausea or vomiting. 11/11/14   Wynetta EmeryNicole Pisciotta,  PA-C    Family History No family history on file.  Social History Social History  Substance Use Topics  . Smoking status: Current Some Day Smoker  . Smokeless tobacco: Never Used  . Alcohol use No     Allergies   Mosquito (diagnostic)   Review of Systems Review of Systems  Skin: Positive for rash.  All other systems reviewed and are negative.    Physical Exam Updated Vital Signs BP 124/69 (BP Location: Left Arm)   Pulse 77   Temp 98.2 F (36.8 C) (Oral)   Resp 17   Ht 5\' 5"  (1.651 m)   Wt 45.4 kg   LMP 10/15/2016   SpO2 100%   BMI 16.64 kg/m   Physical Exam  Constitutional: She is oriented to person, place, and time. She appears well-developed and well-nourished.  Non-toxic appearance.  HENT:  Head: Normocephalic.  Right Ear: Tympanic membrane and external ear normal.  Left Ear: Tympanic membrane and external ear normal.  Eyes: EOM and lids are normal. Pupils are equal, round, and reactive to light.  Neck: Normal range of motion. Neck supple. Carotid bruit is not present.  Cardiovascular: Normal rate, regular rhythm, normal heart sounds, intact distal pulses and normal pulses.   Pulmonary/Chest: Breath sounds normal. No respiratory distress.  Abdominal: Soft. Bowel sounds are normal. There is no tenderness. There  is no guarding.  Musculoskeletal: Normal range of motion.  Lymphadenopathy:       Head (right side): No submandibular adenopathy present.       Head (left side): No submandibular adenopathy present.    She has no cervical adenopathy.  Neurological: She is alert and oriented to person, place, and time. She has normal strength. No cranial nerve deficit or sensory deficit.  Skin: Skin is warm and dry.  Several raised areas on the right side of the face. 2 raised red areas on the left side of the face. There are multiple raised areas on the left lower abdomen. There are bumps on the right and left hand.  Psychiatric: She has a normal mood and affect.  Her speech is normal.  Nursing note and vitals reviewed.    ED Treatments / Results  Labs (all labs ordered are listed, but only abnormal results are displayed) Labs Reviewed - No data to display  EKG  EKG Interpretation None       Radiology No results found.  Procedures Procedures (including critical care time)  Medications Ordered in ED Medications - No data to display   Initial Impression / Assessment and Plan / ED Course  I have reviewed the triage vital signs and the nursing notes.  Pertinent labs & imaging results that were available during my care of the patient were reviewed by me and considered in my medical decision making (see chart for details).  Clinical Course     **I have reviewed nursing notes, vital signs, and all appropriate lab and imaging results for this patient.*  Final Clinical Impressions(s) / ED Diagnoses  Vital signs within normal limits. Pulse oximetry is 100% on room air. Within normal limits by my interpretation.  The examination favors bedbug bites. We'll use Elimite. Prescription also given for Decadron. I have asked the patient to use Claritin during the day for itching, and Benadryl at night for itching. We discussed the need to vacuum around the bed, and on any cloth covered couches or pads and the carpet. We discuss washing linens daily, and washing clothing daily until this issue resolves.    Final diagnoses:  Insect bite, initial encounter    New Prescriptions New Prescriptions   No medications on file     Ivery QualeHobson Johnanna Bakke, PA-C 10/25/16 1846    Vanetta MuldersScott Zackowski, MD 10/26/16 [phone]

## 2016-10-24 NOTE — Discharge Instructions (Signed)
Please vacuum all carpet around beds and couches. Please vacuum cloth covered couches and cushions. Please wash all linen and clothing daily until this issue resolves. Use Decadron 2 times daily with a meal until all taken. Use Claritin during the day for itching, use Benadryl at bedtime for itching. Cool compresses to the face may be helpful.

## 2016-10-24 NOTE — ED Triage Notes (Signed)
Itchy red raised bumps to rt side of face, rt/lt finger and lt lower abd.  Started earlier today.

## 2017-10-15 ENCOUNTER — Emergency Department (HOSPITAL_COMMUNITY): Payer: Self-pay

## 2017-10-15 ENCOUNTER — Other Ambulatory Visit: Payer: Self-pay

## 2017-10-15 ENCOUNTER — Encounter (HOSPITAL_COMMUNITY): Payer: Self-pay

## 2017-10-15 ENCOUNTER — Emergency Department (HOSPITAL_COMMUNITY)
Admission: EM | Admit: 2017-10-15 | Discharge: 2017-10-15 | Disposition: A | Discharge status: Home / Self Care | Payer: Self-pay | Attending: Emergency Medicine | Admitting: Emergency Medicine

## 2017-10-15 DIAGNOSIS — R102 Pelvic and perineal pain: Secondary | ICD-10-CM

## 2017-10-15 DIAGNOSIS — N946 Dysmenorrhea, unspecified: Secondary | ICD-10-CM | POA: Insufficient documentation

## 2017-10-15 DIAGNOSIS — F17200 Nicotine dependence, unspecified, uncomplicated: Secondary | ICD-10-CM | POA: Insufficient documentation

## 2017-10-15 DIAGNOSIS — Z79899 Other long term (current) drug therapy: Secondary | ICD-10-CM | POA: Insufficient documentation

## 2017-10-15 LAB — CBC WITH DIFFERENTIAL/PLATELET
BASOS ABS: 0 10*3/uL (ref 0.0–0.1)
BASOS PCT: 0 %
EOS ABS: 0.1 10*3/uL (ref 0.0–0.7)
EOS PCT: 1 %
HCT: 40.6 % (ref 36.0–46.0)
Hemoglobin: 12.8 g/dL (ref 12.0–15.0)
LYMPHS PCT: 16 %
Lymphs Abs: 1.5 10*3/uL (ref 0.7–4.0)
MCH: 28.7 pg (ref 26.0–34.0)
MCHC: 31.5 g/dL (ref 30.0–36.0)
MCV: 91 fL (ref 78.0–100.0)
MONO ABS: 0.3 10*3/uL (ref 0.1–1.0)
Monocytes Relative: 4 %
Neutro Abs: 7.3 10*3/uL (ref 1.7–7.7)
Neutrophils Relative %: 79 %
PLATELETS: 255 10*3/uL (ref 150–400)
RBC: 4.46 MIL/uL (ref 3.87–5.11)
RDW: 12.4 % (ref 11.5–15.5)
WBC: 9.2 10*3/uL (ref 4.0–10.5)

## 2017-10-15 LAB — COMPREHENSIVE METABOLIC PANEL
ALT: 17 U/L (ref 14–54)
AST: 26 U/L (ref 15–41)
Albumin: 4.8 g/dL (ref 3.5–5.0)
Alkaline Phosphatase: 69 U/L (ref 38–126)
Anion gap: 7 (ref 5–15)
BUN: 10 mg/dL (ref 6–20)
CALCIUM: 9.6 mg/dL (ref 8.9–10.3)
CHLORIDE: 106 mmol/L (ref 101–111)
CO2: 27 mmol/L (ref 22–32)
CREATININE: 0.72 mg/dL (ref 0.44–1.00)
Glucose, Bld: 91 mg/dL (ref 65–99)
Potassium: 3.3 mmol/L — ABNORMAL LOW (ref 3.5–5.1)
SODIUM: 140 mmol/L (ref 135–145)
TOTAL PROTEIN: 7.9 g/dL (ref 6.5–8.1)
Total Bilirubin: 0.8 mg/dL (ref 0.3–1.2)

## 2017-10-15 LAB — URINALYSIS, ROUTINE W REFLEX MICROSCOPIC
BACTERIA UA: NONE SEEN
BILIRUBIN URINE: NEGATIVE
GLUCOSE, UA: NEGATIVE mg/dL
HGB URINE DIPSTICK: NEGATIVE
KETONES UR: NEGATIVE mg/dL
Leukocytes, UA: NEGATIVE
NITRITE: NEGATIVE
Protein, ur: 30 mg/dL — AB
Specific Gravity, Urine: 1.026 (ref 1.005–1.030)
pH: 5 (ref 5.0–8.0)

## 2017-10-15 LAB — POC URINE PREG, ED: PREG TEST UR: NEGATIVE

## 2017-10-15 MED ORDER — SODIUM CHLORIDE 0.9 % IV BOLUS (SEPSIS)
1000.0000 mL | Freq: Once | INTRAVENOUS | Status: AC
  Administered 2017-10-15: 1000 mL via INTRAVENOUS

## 2017-10-15 MED ORDER — MORPHINE SULFATE (PF) 2 MG/ML IV SOLN
2.0000 mg | Freq: Once | INTRAVENOUS | Status: AC
  Administered 2017-10-15: 2 mg via INTRAVENOUS
  Filled 2017-10-15: qty 1

## 2017-10-15 MED ORDER — PROMETHAZINE HCL 12.5 MG PO TABS: 12.5000 mg | ORAL_TABLET | Freq: Four times a day (QID) | ORAL | 0 refills | Status: DC | PRN

## 2017-10-15 MED ORDER — TRAMADOL HCL 50 MG PO TABS: 50.0000 mg | ORAL_TABLET | Freq: Four times a day (QID) | ORAL | 0 refills | Status: DC | PRN

## 2017-10-15 MED ORDER — ONDANSETRON HCL 4 MG/2ML IJ SOLN
4.0000 mg | Freq: Once | INTRAMUSCULAR | Status: AC
  Administered 2017-10-15: 4 mg via INTRAVENOUS
  Filled 2017-10-15: qty 2

## 2017-10-15 MED ORDER — SODIUM CHLORIDE 0.9 % IV SOLN: 1000.0000 mL | INTRAVENOUS | Status: DC

## 2017-10-15 MED ORDER — KETOROLAC TROMETHAMINE 30 MG/ML IJ SOLN
30.0000 mg | Freq: Once | INTRAMUSCULAR | Status: AC
  Administered 2017-10-15: 30 mg via INTRAVENOUS
  Filled 2017-10-15: qty 1

## 2017-10-15 MED ORDER — POTASSIUM CHLORIDE CRYS ER 20 MEQ PO TBCR
20.0000 meq | EXTENDED_RELEASE_TABLET | Freq: Once | ORAL | Status: AC
  Administered 2017-10-15: 20 meq via ORAL
  Filled 2017-10-15: qty 1

## 2017-10-15 NOTE — Discharge Instructions (Signed)
Your vital signs are within normal limits.  Your lab work is also within normal limits.  Your pregnancy test is negative.  Your ultrasound is negative for any mass, tumor, twisting of your ovary, or signs of surgical problem.  Please see Dr. Despina HiddenEure for GYN evaluation of your pain, and possible suggestion of medications.  Please use your ibuprofen every 6 hours.  May use Ultram for more severe pain.  May use promethazine for nausea or vomiting.  Ultram and promethazine may cause drowsiness.  Please do not drive a vehicle, operate machinery, drink alcohol, or participate in activities requiring concentration when taking this medication.

## 2017-10-15 NOTE — ED Provider Notes (Signed)
[facility] EMERGENCY DEPARTMENT Provider Note   CSN: [id number] Arrival date & time: 10/15/17  0759     History   Chief Complaint Chief Complaint  Patient presents with  . Abdominal Cramping    HPI Michelle Short is a 26 y.o. female.  Patient is a 26 year old female who presents to the emergency department with a complaint of abdominal cramping.  The patient states that she has a history of complicated menstrual cycles.  She states however this morning around 2 AM she was awakened with severe abdominal cramping.  She has been having nausea and vomiting since 2 AM.  She is also had between 4 and 6 episodes of diarrhea.  She has tried change of positions and she is tried heat and nothing seems to help this pain or discomfort.  She states that her menstrual cycle is on its usual time pattern.  She has not had any recent injury or trauma to the lower abdomen.  She is not had any operations or procedures involving her lower abdomen or female organs.  Is not had any recent change in medications and in particular no birth control medications.      History reviewed. No pertinent past medical history.  There are no active problems to display for this patient.   History reviewed. No pertinent surgical history.  OB History    No data available       Home Medications    Prior to Admission medications   Medication Sig Start Date End Date Taking? Authorizing Provider  dexamethasone (DECADRON) 4 MG tablet Take 1 tablet (4 mg total) by mouth 2 (two) times daily with a meal. 10/24/16   Ivery QualeBryant, Tayven Renteria, PA-C  ibuprofen (ADVIL,MOTRIN) 200 MG tablet Take 200 mg by mouth every 6 (six) hours as needed for mild pain.    [provider]  naproxen (NAPROSYN) 500 MG tablet Take 1 tablet (500 mg total) by mouth 2 (two) times daily with a meal. 11/11/14   Pisciotta, Joni ReiningNicole, PA-C  naproxen sodium (ANAPROX) 220 MG tablet Take 440 mg by mouth 2 (two) times daily with a meal.    [provider]  permethrin (ELIMITE) 5 % cream Please apply from neck to toes. Please leave on for about 8 hours, then shower off. 10/24/16   Ivery QualeBryant, Danialle Dement, PA-C  potassium chloride (K-DUR) 10 MEQ tablet Take 2 tablets (20 mEq total) by mouth daily. 11/11/14   Pisciotta, Joni ReiningNicole, PA-C  promethazine (PHENERGAN) 25 MG tablet Take 1 tablet (25 mg total) by mouth every 6 (six) hours as needed for nausea or vomiting. 11/11/14   Pisciotta, Mardella LaymanNicole, PA-C    Family History No family history on file.  Social History Social History   Tobacco Use  . Smoking status: Current Some Day Smoker  . Smokeless tobacco: Never Used  Substance Use Topics  . Alcohol use: No  . Drug use: Yes    Types: Marijuana    Comment: sometimes      Allergies   Mosquito (diagnostic)   Review of Systems Review of Systems  Constitutional: Negative for activity change.       All ROS Neg except as noted in HPI  HENT: Negative for nosebleeds.   Eyes: Negative for photophobia and discharge.  Respiratory: Negative for cough, shortness of breath and wheezing.   Cardiovascular: Negative for chest pain and palpitations.  Gastrointestinal: Positive for abdominal pain, diarrhea, nausea and vomiting. Negative for blood in stool.  Genitourinary: Positive for menstrual problem. Negative for  dysuria, frequency and hematuria.  Musculoskeletal: Negative for arthralgias, back pain and neck pain.  Skin: Negative.   Neurological: Negative for dizziness, seizures and speech difficulty.  Psychiatric/Behavioral: Negative for confusion and hallucinations.     Physical Exam Updated Vital Signs BP 119/72 (BP Location: Right Arm)   Pulse 86   Temp 98.7 F (37.1 C) (Oral)   Resp 18   Ht 5\' 4"  (1.626 m)   Wt 48.5 kg (107 lb)   LMP 10/15/2017   SpO2 100%   BMI 18.37 kg/m   Physical Exam  Constitutional: She is oriented to person, place, and time. She appears well-developed and well-nourished.  Non-toxic appearance.  HENT:    Head: Normocephalic.  Right Ear: Tympanic membrane and external ear normal.  Left Ear: Tympanic membrane and external ear normal.  Eyes: EOM and lids are normal. Pupils are equal, round, and reactive to light.  Neck: Normal range of motion. Neck supple. Carotid bruit is not present.  Cardiovascular: Normal rate, regular rhythm, normal heart sounds, intact distal pulses and normal pulses.  Pulmonary/Chest: Breath sounds normal. No respiratory distress.  Abdominal: Soft. Bowel sounds are normal. There is tenderness in the suprapubic area. There is no guarding and no CVA tenderness.  Musculoskeletal: Normal range of motion.  Lymphadenopathy:       Head (right side): No submandibular adenopathy present.       Head (left side): No submandibular adenopathy present.    She has no cervical adenopathy.  Neurological: She is alert and oriented to person, place, and time. She has normal strength. No cranial nerve deficit or sensory deficit.  Skin: Skin is warm and dry.  Psychiatric: She has a normal mood and affect. Her speech is normal.  Nursing note and vitals reviewed.    ED Treatments / Results  Labs (all labs ordered are listed, but only abnormal results are displayed) Labs Reviewed  URINALYSIS, ROUTINE W REFLEX MICROSCOPIC  POC URINE PREG, ED    EKG  EKG Interpretation None       Radiology No results found.  Procedures Procedures (including critical care time)  Medications Ordered in ED Medications  morphine 2 MG/ML injection 2 mg (2 mg Intravenous Given 10/15/17 0852)  ondansetron (ZOFRAN) injection 4 mg (4 mg Intravenous Given 10/15/17 0852)     Initial Impression / Assessment and Plan / ED Course  I have reviewed the triage vital signs and the nursing notes.  Pertinent labs & imaging results that were available during my care of the patient were reviewed by me and considered in my medical decision making (see chart for details).       Final Clinical  Impressions(s) / ED Diagnoses  MDM Vital signs within normal limits.  Pulse oximetry is 100% on room air.  Within normal limits by my interpretation.  Patient treated in the emergency department with 2 mg of intravenous morphine.  Recheck. pain improved but not resolved completely.  Urine pregnancy test is negative.  Urinalysis is nonacute.  Complete blood count is well within normal limits.  Recheck.  Patient states it feels as though the cramps are coming back.  IV Toradol was given to the patient.  Ultrasound shows no significant abnormality of the uterus, endometrium, right or left ovary.  No free fluid present.  I discussed the findings with the patient in terms of which he understands.  I suspect the patient has dysmenorrhea.  The patient will be treated with ibuprofen every 6 hours.  Prescription for Ultram given  to the patient.  Patient will use promethazine for nausea if needed.  I referred the patient to GYN for additional evaluation and management of her recurrent dysmenorrhea.  Patient is in agreement with this plan.   Final diagnoses:  Dysmenorrhea    ED Discharge Orders        Ordered    promethazine (PHENERGAN) 12.5 MG tablet  Every 6 hours PRN     10/15/17 1116    traMADol (ULTRAM) 50 MG tablet  Every 6 hours PRN     10/15/17 1116       Ivery QualeBryant, Rosa Wyly, PA-C 10/15/17 1157    Gerhard MunchLockwood, Robert, MD 10/15/17 1535

## 2017-10-15 NOTE — ED Triage Notes (Signed)
PAtient reports of starting period this morning and having lower abdominal cramps. STates she has been vomiting since 0200 when cramps started.

## 2017-12-22 DIAGNOSIS — R569 Unspecified convulsions: Secondary | ICD-10-CM

## 2017-12-22 HISTORY — DX: Unspecified convulsions: R56.9

## 2018-01-11 ENCOUNTER — Other Ambulatory Visit: Payer: Self-pay

## 2018-01-11 ENCOUNTER — Emergency Department (HOSPITAL_COMMUNITY): Payer: Medicaid Other

## 2018-01-11 ENCOUNTER — Observation Stay (HOSPITAL_COMMUNITY)
Admission: EM | Admit: 2018-01-11 | Discharge: 2018-01-12 | Disposition: A | Discharge status: Home / Self Care | Payer: Medicaid Other | Attending: Internal Medicine | Admitting: Internal Medicine

## 2018-01-11 ENCOUNTER — Encounter (HOSPITAL_COMMUNITY): Payer: Self-pay | Admitting: Neurology

## 2018-01-11 DIAGNOSIS — O2341 Unspecified infection of urinary tract in pregnancy, first trimester: Secondary | ICD-10-CM | POA: Insufficient documentation

## 2018-01-11 DIAGNOSIS — O99331 Smoking (tobacco) complicating pregnancy, first trimester: Secondary | ICD-10-CM | POA: Diagnosis not present

## 2018-01-11 DIAGNOSIS — Z3A01 Less than 8 weeks gestation of pregnancy: Secondary | ICD-10-CM | POA: Diagnosis not present

## 2018-01-11 DIAGNOSIS — Z79899 Other long term (current) drug therapy: Secondary | ICD-10-CM | POA: Diagnosis not present

## 2018-01-11 DIAGNOSIS — O99351 Diseases of the nervous system complicating pregnancy, first trimester: Principal | ICD-10-CM | POA: Insufficient documentation

## 2018-01-11 DIAGNOSIS — N390 Urinary tract infection, site not specified: Secondary | ICD-10-CM

## 2018-01-11 DIAGNOSIS — F1210 Cannabis abuse, uncomplicated: Secondary | ICD-10-CM

## 2018-01-11 DIAGNOSIS — O99321 Drug use complicating pregnancy, first trimester: Secondary | ICD-10-CM | POA: Diagnosis not present

## 2018-01-11 DIAGNOSIS — R569 Unspecified convulsions: Secondary | ICD-10-CM

## 2018-01-11 DIAGNOSIS — O021 Missed abortion: Secondary | ICD-10-CM

## 2018-01-11 DIAGNOSIS — F17200 Nicotine dependence, unspecified, uncomplicated: Secondary | ICD-10-CM | POA: Insufficient documentation

## 2018-01-11 HISTORY — DX: Cannabis abuse, uncomplicated: F12.10

## 2018-01-11 LAB — URINALYSIS, ROUTINE W REFLEX MICROSCOPIC
Bilirubin Urine: NEGATIVE
Glucose, UA: NEGATIVE mg/dL
HGB URINE DIPSTICK: NEGATIVE
Ketones, ur: NEGATIVE mg/dL
NITRITE: NEGATIVE
Protein, ur: NEGATIVE mg/dL
SPECIFIC GRAVITY, URINE: 1.014 (ref 1.005–1.030)
pH: 6 (ref 5.0–8.0)

## 2018-01-11 LAB — CBC WITH DIFFERENTIAL/PLATELET
BASOS ABS: 0 10*3/uL (ref 0.0–0.1)
BASOS PCT: 0 %
EOS ABS: 0 10*3/uL (ref 0.0–0.7)
Eosinophils Relative: 0 %
HCT: 37.3 % (ref 36.0–46.0)
HEMOGLOBIN: 12.4 g/dL (ref 12.0–15.0)
Lymphocytes Relative: 17 %
Lymphs Abs: 1.3 10*3/uL (ref 0.7–4.0)
MCH: 29.5 pg (ref 26.0–34.0)
MCHC: 33.2 g/dL (ref 30.0–36.0)
MCV: 88.6 fL (ref 78.0–100.0)
Monocytes Absolute: 0.5 10*3/uL (ref 0.1–1.0)
Monocytes Relative: 7 %
NEUTROS PCT: 76 %
Neutro Abs: 5.8 10*3/uL (ref 1.7–7.7)
Platelets: 218 10*3/uL (ref 150–400)
RBC: 4.21 MIL/uL (ref 3.87–5.11)
RDW: 12.2 % (ref 11.5–15.5)
WBC: 7.7 10*3/uL (ref 4.0–10.5)

## 2018-01-11 LAB — COMPREHENSIVE METABOLIC PANEL
ALBUMIN: 4.1 g/dL (ref 3.5–5.0)
ALK PHOS: 58 U/L (ref 38–126)
ALT: 15 U/L (ref 14–54)
ANION GAP: 6 (ref 5–15)
AST: 24 U/L (ref 15–41)
BUN: 7 mg/dL (ref 6–20)
CALCIUM: 9.3 mg/dL (ref 8.9–10.3)
CO2: 25 mmol/L (ref 22–32)
CREATININE: 0.71 mg/dL (ref 0.44–1.00)
Chloride: 106 mmol/L (ref 101–111)
GFR calc Af Amer: >60 mL/min (ref >60)
GFR calc non Af Amer: >60 mL/min (ref >60)
Glucose, Bld: 94 mg/dL (ref 65–99)
Potassium: 4 mmol/L (ref 3.5–5.1)
SODIUM: 137 mmol/L (ref 135–145)
Total Bilirubin: 0.7 mg/dL (ref 0.3–1.2)
Total Protein: 6.7 g/dL (ref 6.5–8.1)

## 2018-01-11 LAB — RAPID URINE DRUG SCREEN, HOSP PERFORMED
Amphetamines: NOT DETECTED
BARBITURATES: NOT DETECTED
BENZODIAZEPINES: NOT DETECTED
Cocaine: NOT DETECTED
Opiates: NOT DETECTED
Tetrahydrocannabinol: POSITIVE — AB

## 2018-01-11 LAB — I-STAT BETA HCG BLOOD, ED (MC, WL, AP ONLY): HCG, QUANTITATIVE: 1307.6 m[IU]/mL — AB (ref <5)

## 2018-01-11 MED ORDER — ACETAMINOPHEN 325 MG PO TABS: 650.0000 mg | ORAL_TABLET | Freq: Four times a day (QID) | ORAL | Status: DC | PRN

## 2018-01-11 MED ORDER — LORAZEPAM 2 MG/ML IJ SOLN: 1.0000 mg | INTRAMUSCULAR | Status: DC | PRN

## 2018-01-11 MED ORDER — SODIUM CHLORIDE 0.9 % IV SOLN
1000.0000 mg | Freq: Once | INTRAVENOUS | Status: AC
  Administered 2018-01-11: 1000 mg via INTRAVENOUS
  Filled 2018-01-11: qty 10

## 2018-01-11 MED ORDER — LORAZEPAM 2 MG/ML IJ SOLN
1.0000 mg | Freq: Once | INTRAMUSCULAR | Status: AC
  Administered 2018-01-11: 1 mg via INTRAVENOUS

## 2018-01-11 MED ORDER — SODIUM CHLORIDE 0.9 % IV SOLN
500.0000 mg | Freq: Two times a day (BID) | INTRAVENOUS | Status: DC
  Administered 2018-01-12: 500 mg via INTRAVENOUS
  Filled 2018-01-11 (×3): qty 5

## 2018-01-11 MED ORDER — SODIUM CHLORIDE 0.9 % IV SOLN
INTRAVENOUS | Status: DC
  Administered 2018-01-12 (×2): via INTRAVENOUS

## 2018-01-11 MED ORDER — LORAZEPAM 2 MG/ML IJ SOLN
INTRAMUSCULAR | Status: AC
  Filled 2018-01-11: qty 1

## 2018-01-11 MED ORDER — ONDANSETRON HCL 4 MG PO TABS: 4.0000 mg | ORAL_TABLET | Freq: Four times a day (QID) | ORAL | Status: DC | PRN

## 2018-01-11 MED ORDER — ENOXAPARIN SODIUM 40 MG/0.4ML ~~LOC~~ SOLN
40.0000 mg | SUBCUTANEOUS | Status: DC
  Administered 2018-01-12: 40 mg via SUBCUTANEOUS
  Filled 2018-01-11: qty 0.4

## 2018-01-11 MED ORDER — ACETAMINOPHEN 325 MG PO TABS
650.0000 mg | ORAL_TABLET | Freq: Once | ORAL | Status: AC
Start: 2018-01-11 — End: 2018-01-11
  Administered 2018-01-11: 650 mg via ORAL
  Filled 2018-01-11: qty 2

## 2018-01-11 MED ORDER — SODIUM CHLORIDE 0.9 % IV SOLN
1.0000 g | INTRAVENOUS | Status: DC
  Filled 2018-01-11: qty 10

## 2018-01-11 MED ORDER — PRENATAL PLUS 27-1 MG PO TABS
1.0000 | ORAL_TABLET | Freq: Every day | ORAL | Status: DC
  Administered 2018-01-12: 1 via ORAL
  Filled 2018-01-11: qty 1

## 2018-01-11 MED ORDER — ONDANSETRON HCL 4 MG/2ML IJ SOLN: 4.0000 mg | Freq: Four times a day (QID) | INTRAMUSCULAR | Status: DC | PRN

## 2018-01-11 MED ORDER — SODIUM CHLORIDE 0.9 % IV SOLN
1.0000 g | Freq: Once | INTRAVENOUS | Status: AC
  Administered 2018-01-12: 1 g via INTRAVENOUS
  Filled 2018-01-11: qty 10

## 2018-01-11 MED ORDER — ACETAMINOPHEN 650 MG RE SUPP: 650.0000 mg | Freq: Four times a day (QID) | RECTAL | Status: DC | PRN

## 2018-01-11 MED ORDER — GADOBENATE DIMEGLUMINE 529 MG/ML IV SOLN
8.0000 mL | Freq: Once | INTRAVENOUS | Status: AC | PRN
  Administered 2018-01-11: 8 mL via INTRAVENOUS

## 2018-01-11 MED ORDER — ZOLPIDEM TARTRATE 5 MG PO TABS: 5.0000 mg | ORAL_TABLET | Freq: Every evening | ORAL | Status: DC | PRN

## 2018-01-11 NOTE — ED Provider Notes (Signed)
[facility] Provider Note   CSN: [id number] Arrival date & time: 01/11/18  1032     History   Chief Complaint Chief Complaint  Patient presents with  . Seizures    HPI Michelle Short is a 27 y.o. female.  HPI  Michelle Short is a 27 y.o. female presents to ED after an episode of a seizure. Pt had tonic clonic seizure while sleeping at her boyfriends house. Whitnessed by her boyfriend. Pt with confusion after per EMS, now alert and oriented. Pt sustained tongue trauma.  The patient denies any prior history of seizures.  She states that she has had positive pregnancy test in the last several days.  She reports her last menstrual cycle was last month.  This is her first pregnancy.  She also admits to smoking marijuana daily, last smoked yesterday.  Patient also admits to taking tramadol 4 days ago for abdominal pain. She denies any other drugs or medications. Denies recent illnesses. No vaginal discharge or bleeding. Reports mild headache, otherwise no other complaints.    No past medical history on file.  There are no active problems to display for this patient.   No past surgical history on file.  OB History    No data available       Home Medications    Prior to Admission medications   Medication Sig Start Date End Date Taking? Authorizing Provider  dexamethasone (DECADRON) 4 MG tablet Take 1 tablet (4 mg total) by mouth 2 (two) times daily with a meal. Patient not taking: Reported on 10/15/2017 10/24/16   Ivery QualeBryant, Hobson, PA-C  ibuprofen (ADVIL,MOTRIN) 200 MG tablet Take 200 mg by mouth every 6 (six) hours as needed for mild pain.    [provider]  naproxen (NAPROSYN) 500 MG tablet Take 1 tablet (500 mg total) by mouth 2 (two) times daily with a meal. Patient not taking: Reported on 10/15/2017 11/11/14   Pisciotta, Joni ReiningNicole, PA-C  naproxen sodium (ANAPROX) 220 MG tablet Take 440 mg by mouth 2 (two) times daily with a  meal.    [provider]  permethrin (ELIMITE) 5 % cream Please apply from neck to toes. Please leave on for about 8 hours, then shower off. Patient not taking: Reported on 10/15/2017 10/24/16   Ivery QualeBryant, Hobson, PA-C  potassium chloride (K-DUR) 10 MEQ tablet Take 2 tablets (20 mEq total) by mouth daily. Patient not taking: Reported on 10/15/2017 11/11/14   Pisciotta, Joni ReiningNicole, PA-C  promethazine (PHENERGAN) 12.5 MG tablet Take 1 tablet (12.5 mg total) by mouth every 6 (six) hours as needed for nausea or vomiting. 10/15/17   Ivery QualeBryant, Hobson, PA-C  traMADol (ULTRAM) 50 MG tablet Take 1 tablet (50 mg total) by mouth every 6 (six) hours as needed. 10/15/17   Ivery QualeBryant, Hobson, PA-C    Family History No family history on file.  Social History Social History   Tobacco Use  . Smoking status: Current Some Day Smoker  . Smokeless tobacco: Never Used  Substance Use Topics  . Alcohol use: No  . Drug use: Yes    Types: Marijuana    Comment: sometimes      Allergies   Mosquito (diagnostic)   Review of Systems Review of Systems  Constitutional: Negative for chills and fever.  Respiratory: Negative for cough, chest tightness and shortness of breath.   Cardiovascular: Negative for chest pain, palpitations and leg swelling.  Gastrointestinal: Negative for abdominal pain, diarrhea, nausea and vomiting.  Genitourinary: Negative  for dysuria, flank pain, pelvic pain, vaginal bleeding, vaginal discharge and vaginal pain.  Musculoskeletal: Negative for arthralgias, myalgias, neck pain and neck stiffness.  Skin: Positive for wound. Negative for rash.  Neurological: Positive for seizures and headaches. Negative for dizziness and weakness.  All other systems reviewed and are negative.    Physical Exam Updated Vital Signs There were no vitals taken for this visit.  Physical Exam  Constitutional: She is oriented to person, place, and time. She appears well-developed and well-nourished. No  distress.  HENT:  Head: Normocephalic.  Small bite marks to bilateral tongue and central lower lip.  Eyes: Conjunctivae are normal.  Neck: Neck supple.  Cardiovascular: Normal rate, regular rhythm and normal heart sounds.  Pulmonary/Chest: Effort normal and breath sounds normal. No respiratory distress. She has no wheezes. She has no rales.  Abdominal: Soft. Bowel sounds are normal. She exhibits no distension. There is no tenderness. There is no rebound.  Musculoskeletal: She exhibits no edema.  Neurological: She is alert and oriented to person, place, and time.  5/5 and equal upper and lower extremity strength bilaterally. Equal grip strength bilaterally. Normal finger to nose and heel to shin. No pronator drift. Patellar reflexes 2+   Skin: Skin is warm and dry.  Psychiatric: She has a normal mood and affect. Her behavior is normal.  Nursing note and vitals reviewed.    ED Treatments / Results  Labs (all labs ordered are listed, but only abnormal results are displayed) Labs Reviewed  RAPID URINE DRUG SCREEN, HOSP PERFORMED - Abnormal; Notable for the following components:      Result Value   Tetrahydrocannabinol POSITIVE (*)    All other components within normal limits  URINALYSIS, ROUTINE W REFLEX MICROSCOPIC - Abnormal; Notable for the following components:   APPearance HAZY (*)    Leukocytes, UA LARGE (*)    Bacteria, UA RARE (*)    Squamous Epithelial / LPF 6-30 (*)    All other components within normal limits  I-STAT BETA HCG BLOOD, ED ([facility], [facility], [facility] ONLY) - Abnormal; Notable for the following components:   I-stat hCG, quantitative 1,307.6 (*)    All other components within normal limits  CBC WITH DIFFERENTIAL/PLATELET  COMPREHENSIVE METABOLIC PANEL    EKG  EKG Interpretation  Date/Time:  Thursday January 11 2018 10:57:08 EST Ventricular Rate:  90 PR Interval:    QRS Duration: 84 QT Interval:  351 QTC Calculation: 430 R Axis:   67 Text Interpretation:  Sinus  rhythm No old tracing to compare Confirmed by Shaune Pollack [phone]) on 01/11/2018 11:29:20 AM       Radiology Ct Head Wo Contrast  Result Date: 01/11/2018 CLINICAL DATA:  New seizure. EXAM: CT HEAD WITHOUT CONTRAST TECHNIQUE: Contiguous axial images were obtained from the base of the skull through the vertex without intravenous contrast. COMPARISON:  None. FINDINGS: Brain: No mass lesion, hemorrhage, hydrocephalus, acute infarct, intra-axial, or extra-axial fluid collection. Vascular: No hyperdense vessel or unexpected calcification. Skull: No significant soft tissue swelling.  Normal skull. Sinuses/Orbits: Normal imaged portions of the orbits and globes. Clear paranasal sinuses and mastoid air cells. Other: None. IMPRESSION: Normal head CT.  No explanation for seizure. Electronically Signed   By: Jeronimo Greaves M.D.   On: 01/11/2018 11:29    Procedures Procedures (including critical care time)  Medications Ordered in ED Medications - No data to display   Initial Impression / Assessment and Plan / ED Course  I have reviewed the triage vital signs and  the nursing notes.  Pertinent labs & imaging results that were available during my care of the patient were reviewed by me and considered in my medical decision making (see chart for details).     Patient to the emergency department with first time seizure.  Patient states she took a pregnancy test several days ago and is positive.  Last menstrual cycle 1 month ago, which puts her around [redacted] weeks gestation.  She is not having abdominal pain, no nausea or vomiting, no blood or bleeding.  She is having drug screen, will monitor.  Patient is alert and oriented at this time.  1:53 PM Labs unremarkable.  HCG is 1307.  Discussed with Dr. Erma Heritage, will get MRv MRA brain to rule out venous sinus thrombosis.  We will give her some Tylenol for her headache.  Also spoke with OB/GYN, who advised that no further treatment from OB/GYN standpoint required.   Vital signs are normal.  Will continue to monitor.  Signed out pending MRI  Vitals:   01/11/18 1145 01/11/18 1200 01/11/18 1351 01/11/18 1445  BP: 115/88 115/74 (!) 91/59 111/74  Pulse: 80 94 79 78  Resp: (!) 24 18 20  (!) 23  Temp:      TempSrc:      SpO2: 100% 93% 100% 100%  Weight:      Height:         Final Clinical Impressions(s) / ED Diagnoses   Final diagnoses:  Seizure [facility])    ED Discharge Orders    None      Jaynie Crumble, PA-C 01/11/18 1533  Shaune Pollack, MD 01/12/18 1435

## 2018-01-11 NOTE — ED Notes (Signed)
Patient transported to CT 

## 2018-01-11 NOTE — ED Notes (Signed)
Paged Neuro For Dr. Clarene DukeLittle

## 2018-01-11 NOTE — H&P (Signed)
History and Physical    Michelle Short:[id number] DOB: 12/25/1990 DOA: 01/11/2018  Referring MD/NP/PA:   PCP: Patient, No Pcp Per   Patient coming from:  The patient is coming from home.  At baseline, pt is independent for most of ADL.   Chief Complaint: seizure  HPI: Michelle Short is a 27 y.o. female with medical history significant of marijuana abuse, who presents with seizure.  Per her sister and boyfriend, pt had an episode of seizure activity at about 9:30 AM yesterday. Did describe that the patient's arms are flexed legs outstretched.The episodes lasted for about 2 minutes. She then woke up in the ambulance. In ED, she received Ativan for MRI.  She subsequently had a recurrent generalized convulsive episode.  She had tongue biting and was postictal afterwards. Per her sister, she smoked some marijuana this morning. She used Toradol for abdominal pain recently. Currently patient does not have chest pain, shortness breath, nausea, vomiting, diarrhea, abdominal pain. Patient does not have unilateral weakness, numbness or tingling in his extremities. No facial droop, slurred speech. Patient does not have symptoms of UTI. Patient has positive pregnancy test with hCG 1307. No vaginal bleeding.  ED Course: pt was found to have WBC 7.7, UDS positive for THC, urinalysis with large amount of leukocytes, electrolytes renal function okay, CT-head negative. MRI/MRV of brain negative. Temperature normal, mild tachycardia, O2 sat 92% on room air. Patient is placed on telemetry bed for observation. Neurology, Dr. Amada Jupiter was consulted. Patient was started with Keppra.  Review of Systems:   General: no fevers, chills, no body weight gain, has fatigue HEENT: no blurry vision, hearing changes or sore throat Respiratory: no dyspnea, coughing, wheezing CV: no chest pain, no palpitations GI: no nausea, vomiting, abdominal pain, diarrhea, constipation GU: no dysuria, burning on urination,  increased urinary frequency, hematuria  Ext: no leg edema Neuro: no unilateral weakness, numbness, or tingling, no vision change or hearing loss. Has seizure. Skin: no rash, no skin tear. MSK: No muscle spasm, no deformity, no limitation of range of movement in spin Heme: No easy bruising.  Travel history: No recent long distant travel.  Allergy:  Allergies  Allergen Reactions  . Mosquito (Diagnostic)     Past Medical History:  Diagnosis Date  . Marijuana abuse     History reviewed. No pertinent surgical history.  Social History:  reports that  has never smoked. she has never used smokeless tobacco. She reports that she uses drugs. Drug: Marijuana. She reports that she does not drink alcohol.  Family History:  Family History  Problem Relation Age of Onset  . Hyperlipidemia Mother      Prior to Admission medications   Medication Sig Start Date End Date Taking? Authorizing Provider  Cholecalciferol (VITAMIN D3) 3000 units TABS Take 3,000 Units by mouth daily.   Yes [provider]  Prenatal Vit-Fe Fumarate-FA (PRENATAL MULTIVITAMIN) TABS tablet Take 1 tablet by mouth daily at 12 noon.   Yes [provider]  traMADol (ULTRAM) 50 MG tablet Take 1 tablet (50 mg total) by mouth every 6 (six) hours as needed. 10/15/17  Yes Ivery Quale, PA-C  dexamethasone (DECADRON) 4 MG tablet Take 1 tablet (4 mg total) by mouth 2 (two) times daily with a meal. Patient not taking: Reported on 10/15/2017 10/24/16   Ivery Quale, PA-C  ibuprofen (ADVIL,MOTRIN) 200 MG tablet Take 200 mg by mouth every 6 (six) hours as needed for mild pain.    [provider]  naproxen (  NAPROSYN) 500 MG tablet Take 1 tablet (500 mg total) by mouth 2 (two) times daily with a meal. Patient not taking: Reported on 10/15/2017 11/11/14   Pisciotta, Joni ReiningNicole, PA-C  naproxen sodium (ANAPROX) 220 MG tablet Take 440 mg by mouth 2 (two) times daily with a meal.    [provider]  permethrin  (ELIMITE) 5 % cream Please apply from neck to toes. Please leave on for about 8 hours, then shower off. Patient not taking: Reported on 10/15/2017 10/24/16   Ivery QualeBryant, Hobson, PA-C  potassium chloride (K-DUR) 10 MEQ tablet Take 2 tablets (20 mEq total) by mouth daily. Patient not taking: Reported on 10/15/2017 11/11/14   Pisciotta, Joni ReiningNicole, PA-C  promethazine (PHENERGAN) 12.5 MG tablet Take 1 tablet (12.5 mg total) by mouth every 6 (six) hours as needed for nausea or vomiting. Patient not taking: Reported on 01/11/2018 10/15/17   Ivery QualeBryant, Hobson, PA-C    Physical Exam: Vitals:   01/11/18 2330 01/12/18 0015 01/12/18 0056 01/12/18 0429  BP: 94/71 104/75 (!) 98/49 (!) 98/48  Pulse:   71 85  Resp:   20 20  Temp:   98.1 F (36.7 C) 99.4 F (37.4 C)  TempSrc:   Oral Oral  SpO2:   97% 100%  Weight:   49.1 kg (108 lb 3.9 oz)   Height:   5\' 2"  (1.575 m)    General: Not in acute distress HEENT:       Eyes: PERRL, EOMI, no scleral icterus.       ENT: No discharge from the ears and nose, no pharynx injection, no tonsillar enlargement.        Neck: No JVD, no bruit, no mass felt. Heme: No neck lymph node enlargement. Cardiac: S1/S2, RRR, No murmurs, No gallops or rubs. Respiratory:  No rales, wheezing, rhonchi or rubs. GI: Soft, nondistended, nontender, no rebound pain, no organomegaly, BS present. GU: No hematuria Ext: No pitting leg edema bilaterally. 2+DP/PT pulse bilaterally. Musculoskeletal: No joint deformities, No joint redness or warmth, no limitation of ROM in spin. Skin: No rashes.  Neuro: in postictal status, cranial nerves II-XII grossly intact, moves all extremities normally.  Psych: Patient is not psychotic, no suicidal or hemocidal ideation.  Labs on Admission: I have personally reviewed following labs and imaging studies  CBC: Recent Labs  Lab 01/11/18 1132 01/12/18 0116  WBC 7.7 12.4*  NEUTROABS 5.8  --   HGB 12.4 11.9*  HCT 37.3 35.7*  MCV 88.6 88.1  PLT 218 225    Basic Metabolic Panel: Recent Labs  Lab 01/11/18 1132 01/12/18 0116  NA 137 136  K 4.0 3.8  CL 106 105  CO2 25 23  GLUCOSE 94 83  BUN 7 5*  CREATININE 0.71 0.78  CALCIUM 9.3 8.7*   GFR: Estimated Creatinine Clearance: 83.3 mL/min (by C-G formula based on SCr of 0.78 mg/dL). Liver Function Tests: Recent Labs  Lab 01/11/18 1132  AST 24  ALT 15  ALKPHOS 58  BILITOT 0.7  PROT 6.7  ALBUMIN 4.1   No results for input(s): LIPASE, AMYLASE in the last 168 hours. No results for input(s): AMMONIA in the last 168 hours. Coagulation Profile: No results for input(s): INR, PROTIME in the last 168 hours. Cardiac Enzymes: No results for input(s): CKTOTAL, CKMB, CKMBINDEX, TROPONINI in the last 168 hours. BNP (last 3 results) No results for input(s): PROBNP in the last 8760 hours. HbA1C: No results for input(s): HGBA1C in the last 72 hours. CBG: No results for input(s): GLUCAP  in the last 168 hours. Lipid Profile: No results for input(s): CHOL, HDL, LDLCALC, TRIG, CHOLHDL, LDLDIRECT in the last 72 hours. Thyroid Function Tests: No results for input(s): TSH, T4TOTAL, FREET4, T3FREE, THYROIDAB in the last 72 hours. Anemia Panel: No results for input(s): VITAMINB12, FOLATE, FERRITIN, TIBC, IRON, RETICCTPCT in the last 72 hours. Urine analysis:    Component Value Date/Time   COLORURINE YELLOW 01/11/2018 1226   APPEARANCEUR HAZY (A) 01/11/2018 1226   LABSPEC 1.014 01/11/2018 1226   PHURINE 6.0 01/11/2018 1226   GLUCOSEU NEGATIVE 01/11/2018 1226   HGBUR NEGATIVE 01/11/2018 1226   BILIRUBINUR NEGATIVE 01/11/2018 1226   KETONESUR NEGATIVE 01/11/2018 1226   PROTEINUR NEGATIVE 01/11/2018 1226   UROBILINOGEN 0.2 11/11/2014 0757   NITRITE NEGATIVE 01/11/2018 1226   LEUKOCYTESUR LARGE (A) 01/11/2018 1226   Sepsis Labs: @LABRCNTIP (procalcitonin:4,lacticidven:4) )No results found for this or any previous visit (from the past 240 hour(s)).   Radiological Exams on Admission: Ct  Head Wo Contrast  Result Date: 01/11/2018 CLINICAL DATA:  New seizure. EXAM: CT HEAD WITHOUT CONTRAST TECHNIQUE: Contiguous axial images were obtained from the base of the skull through the vertex without intravenous contrast. COMPARISON:  None. FINDINGS: Brain: No mass lesion, hemorrhage, hydrocephalus, acute infarct, intra-axial, or extra-axial fluid collection. Vascular: No hyperdense vessel or unexpected calcification. Skull: No significant soft tissue swelling.  Normal skull. Sinuses/Orbits: Normal imaged portions of the orbits and globes. Clear paranasal sinuses and mastoid air cells. Other: None. IMPRESSION: Normal head CT.  No explanation for seizure. Electronically Signed   By: Jeronimo Greaves M.D.   On: 01/11/2018 11:29   Mr Laqueta Jean ZD Contrast  Result Date: 01/11/2018 CLINICAL DATA:  27 y/o  F; witnessed tonic-clonic seizure episode. EXAM: MRI HEAD WITHOUT AND WITH CONTRAST MRV HEAD WITHOUT CONTRAST TECHNIQUE: Multiplanar, multiecho pulse sequences of the brain and surrounding structures were obtained without and with intravenous contrast. Venographic images of the head were obtained using MRV technique without contrast. CONTRAST:  8mL MULTIHANCE GADOBENATE DIMEGLUMINE 529 MG/ML IV SOLN COMPARISON:  01/11/2018 CT head FINDINGS: MRI HEAD FINDINGS Brain: No acute infarction, hemorrhage, hydrocephalus, extra-axial collection or mass lesion. No abnormal enhancement. Morphologically normal pituitary, corpus callosum, and cerebellum. No cerebellar tonsillar ectopia. No disorder cortical formation, heterotopia, or dysplasia identified. Mild motion degradation of coronal T2 sequence, hippocampi appear symmetric in size and signal. Vascular: Normal flow voids. Skull and upper cervical spine: Normal marrow signal. Sinuses/Orbits: Negative. Other: None. MRV HEAD FINDINGS Normal flow related signal in superior sagittal sinus, straight sinus, bilateral transverse sinus, bilateral sigmoid sinus, bilateral upper  internal jugular veins. Normal flow related signal in large cortical veins, basal veins of Rosenthal, and internal cerebral veins. IMPRESSION: 1. Normal MRI of the brain. No structural cause of seizure identified. 2. Normal MRV of the head. Electronically Signed   By: Mitzi Hansen M.D.   On: 01/11/2018 21:02   Mr Mrv Head Wo Cm  Result Date: 01/11/2018 CLINICAL DATA:  27 y/o  F; witnessed tonic-clonic seizure episode. EXAM: MRI HEAD WITHOUT AND WITH CONTRAST MRV HEAD WITHOUT CONTRAST TECHNIQUE: Multiplanar, multiecho pulse sequences of the brain and surrounding structures were obtained without and with intravenous contrast. Venographic images of the head were obtained using MRV technique without contrast. CONTRAST:  8mL MULTIHANCE GADOBENATE DIMEGLUMINE 529 MG/ML IV SOLN COMPARISON:  01/11/2018 CT head FINDINGS: MRI HEAD FINDINGS Brain: No acute infarction, hemorrhage, hydrocephalus, extra-axial collection or mass lesion. No abnormal enhancement. Morphologically normal pituitary, corpus callosum, and cerebellum. No cerebellar tonsillar ectopia.  No disorder cortical formation, heterotopia, or dysplasia identified. Mild motion degradation of coronal T2 sequence, hippocampi appear symmetric in size and signal. Vascular: Normal flow voids. Skull and upper cervical spine: Normal marrow signal. Sinuses/Orbits: Negative. Other: None. MRV HEAD FINDINGS Normal flow related signal in superior sagittal sinus, straight sinus, bilateral transverse sinus, bilateral sigmoid sinus, bilateral upper internal jugular veins. Normal flow related signal in large cortical veins, basal veins of Rosenthal, and internal cerebral veins. IMPRESSION: 1. Normal MRI of the brain. No structural cause of seizure identified. 2. Normal MRV of the head. Electronically Signed   By: Mitzi Hansen M.D.   On: 01/11/2018 21:02     EKG:  Reviewed independently, normal EKG  Assessment/Plan Principal Problem:   Seizure  [facility]) Active Problems:   Marijuana abuse   Pregnancy   Acute lower UTI   Seizure [facility]): new seizure. Etiology is not clear. MRI/MRV is negative. CT head is negative. Patient used tramadol which may have contributed partially. Patient has UTI which may have also contributed partially. Neurology, Dr. Amada Jupiter was consulted, recommended to get EEG and treat patient with Keppra.  -will place on tele bed for obs -Seizure precaution -When necessary Ativan for seizure -Keppra: 1000 mg loading and 500 mg bid -EEG  UTI: pt has positive urinalysis. since patient is pregnant, will need to be treated. -rocephine -Bx and Ux  Marijuana abuse: -Need counseling when pt fully wakes up  Pregnancy:  -continue prenatal vitamin   DVT ppx: SQ Lovenox Code Status: Full code Family Communication:   Yes, patient's  Boyfriend and sister  at bed side Disposition Plan:  Anticipate discharge back to previous home environment Consults called:  Neurology, Dr. Amada Jupiter Admission status: Obs / tele    Date of Service 01/12/2018    Lorretta Harp [facility] Pager [phone]  If 7PM-7AM, please contact night-coverage [email or web address] Password [facility] 01/12/2018, 5:56 AM

## 2018-01-11 NOTE — ED Triage Notes (Signed)
Per ems- new onset seizures, witnessed by her boyfriend. Has oral trauma to her tongue. Had positive pregnancy test this week. CBG 114, BP 120/80, HR 90, 99% RA, 20 L. Forearm. She was postictal initially, but is now a x 4.

## 2018-01-11 NOTE — ED Notes (Signed)
MRI called stating that while having the scan patient became anxious and then stopped talking back to them.  Ativan orders received.  Upon assessment patient was resting in bed.  Spoke in full sentences stating she is anxious.  Ativan to be administer and patient to be on pulse ox while in scanner.

## 2018-01-11 NOTE — ED Notes (Signed)
Family called out stating that patient became delusional.  Upon assessment patient had shaking of the right arm and hand.  None visualized or felt in the left.  Lasted less than 30 seconds and patient recalled that arm was moving.  Denies pain or numbness at this time.

## 2018-01-11 NOTE — ED Provider Notes (Signed)
Received this patient in signout from PA. We were awaiting MRI/MRV.  Imaging was negative for acute process.  Shortly after arriving back from MRI scanner, the patient had a generalized tonic-clonic seizure witnessed by nursing staff that resolved without medication.  When I arrived to bedside, patient had bitten her tongue and was postictal. I discussed w/ neurology, Dr. Amada JupiterKirkpatrick, who will see pt in consultation and agrees with plan to load with Keppra, admit for EEG. Discussed admission with Dr. Clyde LundborgNiu.   Little, Ambrose Finlandachel Morgan, MD 01/11/18 2258

## 2018-01-11 NOTE — ED Notes (Signed)
Patient was resting in bed and then sat up and yelled.  Then had grand mal seizure.  Post ictal with low sats after.  Keppra ordered by MD

## 2018-01-12 ENCOUNTER — Other Ambulatory Visit: Payer: Self-pay

## 2018-01-12 ENCOUNTER — Observation Stay (HOSPITAL_COMMUNITY): Payer: Medicaid Other

## 2018-01-12 ENCOUNTER — Encounter (HOSPITAL_COMMUNITY): Payer: Self-pay | Admitting: Internal Medicine

## 2018-01-12 DIAGNOSIS — F1210 Cannabis abuse, uncomplicated: Secondary | ICD-10-CM | POA: Diagnosis not present

## 2018-01-12 DIAGNOSIS — N390 Urinary tract infection, site not specified: Secondary | ICD-10-CM | POA: Diagnosis not present

## 2018-01-12 DIAGNOSIS — R569 Unspecified convulsions: Secondary | ICD-10-CM

## 2018-01-12 LAB — HIV ANTIBODY (ROUTINE TESTING W REFLEX): HIV Screen 4th Generation wRfx: NONREACTIVE

## 2018-01-12 LAB — CBC
HEMATOCRIT: 35.7 % — AB (ref 36.0–46.0)
HEMOGLOBIN: 11.9 g/dL — AB (ref 12.0–15.0)
MCH: 29.4 pg (ref 26.0–34.0)
MCHC: 33.3 g/dL (ref 30.0–36.0)
MCV: 88.1 fL (ref 78.0–100.0)
Platelets: 225 10*3/uL (ref 150–400)
RBC: 4.05 MIL/uL (ref 3.87–5.11)
RDW: 12.3 % (ref 11.5–15.5)
WBC: 12.4 10*3/uL — ABNORMAL HIGH (ref 4.0–10.5)

## 2018-01-12 LAB — BASIC METABOLIC PANEL
ANION GAP: 8 (ref 5–15)
BUN: 5 mg/dL — ABNORMAL LOW (ref 6–20)
CALCIUM: 8.7 mg/dL — AB (ref 8.9–10.3)
CO2: 23 mmol/L (ref 22–32)
Chloride: 105 mmol/L (ref 101–111)
Creatinine, Ser: 0.78 mg/dL (ref 0.44–1.00)
GFR calc Af Amer: >60 mL/min (ref >60)
GFR calc non Af Amer: >60 mL/min (ref >60)
GLUCOSE: 83 mg/dL (ref 65–99)
Potassium: 3.8 mmol/L (ref 3.5–5.1)
Sodium: 136 mmol/L (ref 135–145)

## 2018-01-12 MED ORDER — LEVETIRACETAM 500 MG PO TABS: 500.0000 mg | ORAL_TABLET | Freq: Two times a day (BID) | ORAL | 0 refills | Status: DC

## 2018-01-12 MED ORDER — CEFUROXIME AXETIL 500 MG PO TABS: 500.0000 mg | ORAL_TABLET | Freq: Two times a day (BID) | ORAL | 0 refills | Status: AC

## 2018-01-12 NOTE — Care Management Note (Signed)
Case Management Note  Patient Details  Name: Michelle Short MRN: [id number] Date of Birth: 07/11/1991  Subjective/Objective:      Pt admitted with seizures. She is from home with family.  Pt has no PCP and no insurance.            Action/Plan: CM met with the patient and provided her resources for PCP in [place] and in [place]. CM will follow for d/c medications and assistance needed.   Expected Discharge Date:                  Expected Discharge Plan:  Home/Self Care  In-House Referral:     Discharge planning Services  CM Consult, Indigent Health Clinic, Medication Assistance  Post Acute Care Choice:    Choice offered to:     DME Arranged:    DME Agency:     HH Arranged:    HH Agency:     Status of Service:  In process, will continue to follow  If discussed at Long Length of Stay Meetings, dates discussed:    Additional Comments:  Kelli F Willard, RN 01/12/2018, 12:52 PM  

## 2018-01-12 NOTE — Procedures (Signed)
ELECTROENCEPHALOGRAM REPORT  Date of Study: 01/12/2018  Patient's Name: Michelle Short MRN: [id number] Date of Birth: [birth year]  Referring Provider: Dr. Lorretta HarpXilin Niu  Clinical History: This is a 27 year old woman with new onset seizures.  Medications: keppra Ativan  Technical Summary: A multichannel digital EEG recording measured by the international 10-20 system with electrodes applied with paste and impedances below 5000 ohms performed in our laboratory with EKG monitoring in an awake and asleep patient.  Hyperventilation and photic stimulation were were performed.  The digital EEG was referentially recorded, reformatted, and digitally filtered in a variety of bipolar and referential montages for optimal display.    Description: The patient is awake and asleep during the recording.  During maximal wakefulness, there is a symmetric, medium voltage 10 Hz posterior dominant rhythm that attenuates with eye opening.  There is occasional focal 4-5 Hz theta slowing over the left temporal region. During drowsiness and sleep, there is an increase in theta and delta slowing of the background with vertex waves seen.  Hyperventilation and photic stimulation did not elicit any abnormalities.  There were no epileptiform discharges or electrographic seizures seen.    EKG lead was unremarkable.  Impression: This awake and asleep EEG is abnormal due to occasional focal slowing over the left temporal region.  Clinical Correlation of the above findings indicates focal cerebral dysfunction over the left temporal region suggestive of underlying structural or physiologic abnormality. The absence of epileptiform discharges does not exclude a clinical diagnosis of epilepsy. Clinical correlation is advised.    Patrcia DollyKaren Aquino, M.D.

## 2018-01-12 NOTE — Progress Notes (Addendum)
Same day progress note  Patient seen and examined.  No acute changes Normal neurological exam.  Pending EEG.  Recommendations Continue with Keppra 500 twice daily Seizure precautions discussed in detail with the patient including driving resections [place].  I will addend my note after the EEG is completed with any new recommendations.  -- Milon DikesAshish Quinley Nesler, MD [facility] Pager: [phone] If 7pm to 7am, please call on call as listed on AMION.   ADDENDUM-post EEG EEG shows focal slowing on left, which could be the focus of seizure. She should continue to be on Keppra 500 BID given focal slowing on EEG - MRI/MRV brain showed no lesion.  Return to the ER for seizure >272min or not returning to baseline b/w seizures. Sz precautions. Follow up with outpatient neurology in 2-4 weeks. Follow up with OB as previously planned.  -- Milon DikesAshish Sheral Pfahler, MD [facility] Pager: [phone] If 7pm to 7am, please call on call as listed on AMION.

## 2018-01-12 NOTE — Progress Notes (Signed)
EEG Completed; Results Pending  

## 2018-01-12 NOTE — Progress Notes (Signed)
Order to discharge acknoledged

## 2018-01-12 NOTE — Progress Notes (Signed)
Patient admitted from the ED. Patient alert and oriented x 4. Patient oriented to room, tele placed and was verified. Will continue to monitor.

## 2018-01-12 NOTE — Discharge Summary (Signed)
Physician Discharge Summary  Michelle Short ZOX:[id number] DOB: January 05, 1991 DOA: 01/11/2018  PCP: Patient, No Pcp Per  Admit date: 01/11/2018 Discharge date: 01/12/2018  Admitted From: home Disposition:  home     Discharge Condition:  stable   CODE STATUS:  Full code   Consultations:  neurology    Discharge Diagnoses:  Principal Problem:   Seizure (HCC) Active Problems:   Marijuana abuse   Pregnancy   Acute lower UTI   Brief Summary: Michelle Short is a 27 y.o. female with medical history significant of marijuana abuse, who presents with seizure.  Per her sister and boyfriend, pt had an episode of seizure activity at about 9:30 AM yesterday. Did describe that the patient's arms are flexed legs outstretched.The episodes lasted for about 2 minutes. She then woke up in the ambulance. In ED, she received Ativan for MRI.She subsequently had a recurrent generalized convulsive episode. She had tongue biting and was postictal afterwards. Per her sister, she smoked some marijuana thismorning. She used Toradol for abdominal pain recently. Currently patient does not have chest pain, shortness breath, nausea, vomiting, diarrhea, abdominal pain. Patient does not have unilateral weakness, numbness or tingling in his extremities. No facial droop, slurred speech. Patient does not have symptoms of UTI. Patient has positive pregnancy test with hCG 1307. No vaginal bleeding.  Hospital Course:  Seizures - has been started on Keppra which she will continue for now- I have asked her to stop taking Ultram - EEG shows focal slowing over the left temporal area - she is aware that she needs to follow up with neurology as an outpt  Pregnancy - cont prenatal vitamins  UTI - given Ceftriaxone- will switch to Ceftin  Marijuana abuse - advised to stop   Discharge Exam: Vitals:   01/12/18 1130 01/12/18 1600  BP: (!) 117/57 103/60  Pulse: 89 87  Resp: 20 20  Temp: 99.5 F (37.5 C) 99 F  (37.2 C)  SpO2: 100% 99%   Vitals:   01/12/18 0429 01/12/18 0823 01/12/18 1130 01/12/18 1600  BP: (!) 98/48 (!) 93/53 (!) 117/57 103/60  Pulse: 85 78 89 87  Resp: 20 20 20 20   Temp: 99.4 F (37.4 C) 99.5 F (37.5 C) 99.5 F (37.5 C) 99 F (37.2 C)  TempSrc: Oral Oral Oral Oral  SpO2: 100% 100% 100% 99%  Weight:      Height:        General: Pt is alert, awake, not in acute distress Cardiovascular: RRR, S1/S2 +, no rubs, no gallops Respiratory: CTA bilaterally, no wheezing, no rhonchi Abdominal: Soft, NT, ND, bowel sounds + Extremities: no edema, no cyanosis   Discharge Instructions  Discharge Instructions    Ambulatory referral to Neurology   Complete by:  As directed    An appointment is requested in approximately: 1 week   Diet - low sodium heart healthy   Complete by:  As directed    Increase activity slowly   Complete by:  As directed      Allergies as of 01/12/2018      Reactions   Mosquito (diagnostic)       Medication List    STOP taking these medications   ibuprofen 200 MG tablet Commonly known as:  ADVIL,MOTRIN   naproxen 500 MG tablet Commonly known as:  NAPROSYN   naproxen sodium 220 MG tablet Commonly known as:  ALEVE   permethrin 5 % cream Commonly known as:  ELIMITE   promethazine 12.5 MG tablet Commonly  known as:  PHENERGAN   traMADol 50 MG tablet Commonly known as:  ULTRAM     TAKE these medications   levETIRAcetam 500 MG tablet Commonly known as:  KEPPRA Take 1 tablet (500 mg total) by mouth 2 (two) times daily.   prenatal multivitamin Tabs tablet Take 1 tablet by mouth daily at 12 noon.   Vitamin D3 3000 units Tabs Take 3,000 Units by mouth daily.      Follow-up Information    Schedule an appointment as soon as possible for a visit  with [facility].   Contact information: [address]     [address] [place] [phone]         Allergies  Allergen Reactions   . Mosquito (Diagnostic)      Procedures/Studies: EEG  Ct Head Wo Contrast  Result Date: 01/11/2018 CLINICAL DATA:  New seizure. EXAM: CT HEAD WITHOUT CONTRAST TECHNIQUE: Contiguous axial images were obtained from the base of the skull through the vertex without intravenous contrast. COMPARISON:  None. FINDINGS: Brain: No mass lesion, hemorrhage, hydrocephalus, acute infarct, intra-axial, or extra-axial fluid collection. Vascular: No hyperdense vessel or unexpected calcification. Skull: No significant soft tissue swelling.  Normal skull. Sinuses/Orbits: Normal imaged portions of the orbits and globes. Clear paranasal sinuses and mastoid air cells. Other: None. IMPRESSION: Normal head CT.  No explanation for seizure. Electronically Signed   By: Jeronimo GreavesKyle  Talbot M.D.   On: 01/11/2018 11:29   Mr Michelle JeanBrain W WGWo Contrast  Result Date: 01/11/2018 CLINICAL DATA:  27 y/o  F; witnessed tonic-clonic seizure episode. EXAM: MRI HEAD WITHOUT AND WITH CONTRAST MRV HEAD WITHOUT CONTRAST TECHNIQUE: Multiplanar, multiecho pulse sequences of the brain and surrounding structures were obtained without and with intravenous contrast. Venographic images of the head were obtained using MRV technique without contrast. CONTRAST:  8mL MULTIHANCE GADOBENATE DIMEGLUMINE 529 MG/ML IV SOLN COMPARISON:  01/11/2018 CT head FINDINGS: MRI HEAD FINDINGS Brain: No acute infarction, hemorrhage, hydrocephalus, extra-axial collection or mass lesion. No abnormal enhancement. Morphologically normal pituitary, corpus callosum, and cerebellum. No cerebellar tonsillar ectopia. No disorder cortical formation, heterotopia, or dysplasia identified. Mild motion degradation of coronal T2 sequence, hippocampi appear symmetric in size and signal. Vascular: Normal flow voids. Skull and upper cervical spine: Normal marrow signal. Sinuses/Orbits: Negative. Other: None. MRV HEAD FINDINGS Normal flow related signal in superior sagittal sinus, straight sinus,  bilateral transverse sinus, bilateral sigmoid sinus, bilateral upper internal jugular veins. Normal flow related signal in large cortical veins, basal veins of Rosenthal, and internal cerebral veins. IMPRESSION: 1. Normal MRI of the brain. No structural cause of seizure identified. 2. Normal MRV of the head. Electronically Signed   By: Mitzi HansenLance  Furusawa-Stratton M.D.   On: 01/11/2018 21:02   Mr Mrv Head Wo Cm  Result Date: 01/11/2018 CLINICAL DATA:  27 y/o  F; witnessed tonic-clonic seizure episode. EXAM: MRI HEAD WITHOUT AND WITH CONTRAST MRV HEAD WITHOUT CONTRAST TECHNIQUE: Multiplanar, multiecho pulse sequences of the brain and surrounding structures were obtained without and with intravenous contrast. Venographic images of the head were obtained using MRV technique without contrast. CONTRAST:  8mL MULTIHANCE GADOBENATE DIMEGLUMINE 529 MG/ML IV SOLN COMPARISON:  01/11/2018 CT head FINDINGS: MRI HEAD FINDINGS Brain: No acute infarction, hemorrhage, hydrocephalus, extra-axial collection or mass lesion. No abnormal enhancement. Morphologically normal pituitary, corpus callosum, and cerebellum. No cerebellar tonsillar ectopia. No disorder cortical formation, heterotopia, or dysplasia identified. Mild motion degradation of coronal T2 sequence, hippocampi appear symmetric in size and signal. Vascular:  Normal flow voids. Skull and upper cervical spine: Normal marrow signal. Sinuses/Orbits: Negative. Other: None. MRV HEAD FINDINGS Normal flow related signal in superior sagittal sinus, straight sinus, bilateral transverse sinus, bilateral sigmoid sinus, bilateral upper internal jugular veins. Normal flow related signal in large cortical veins, basal veins of Rosenthal, and internal cerebral veins. IMPRESSION: 1. Normal MRI of the brain. No structural cause of seizure identified. 2. Normal MRV of the head. Electronically Signed   By: Mitzi Hansen M.D.   On: 01/11/2018 21:02      The results of significant  diagnostics from this hospitalization (including imaging, microbiology, ancillary and laboratory) are listed below for reference.     Microbiology: No results found for this or any previous visit (from the past 240 hour(s)).   Labs: BNP (last 3 results) No results for input(s): BNP in the last 8760 hours. Basic Metabolic Panel: Recent Labs  Lab 01/11/18 1132 01/12/18 0116  NA 137 136  K 4.0 3.8  CL 106 105  CO2 25 23  GLUCOSE 94 83  BUN 7 5*  CREATININE 0.71 0.78  CALCIUM 9.3 8.7*   Liver Function Tests: Recent Labs  Lab 01/11/18 1132  AST 24  ALT 15  ALKPHOS 58  BILITOT 0.7  PROT 6.7  ALBUMIN 4.1   No results for input(s): LIPASE, AMYLASE in the last 168 hours. No results for input(s): AMMONIA in the last 168 hours. CBC: Recent Labs  Lab 01/11/18 1132 01/12/18 0116  WBC 7.7 12.4*  NEUTROABS 5.8  --   HGB 12.4 11.9*  HCT 37.3 35.7*  MCV 88.6 88.1  PLT 218 225   Cardiac Enzymes: No results for input(s): CKTOTAL, CKMB, CKMBINDEX, TROPONINI in the last 168 hours. BNP: Invalid input(s): POCBNP CBG: No results for input(s): GLUCAP in the last 168 hours. D-Dimer No results for input(s): DDIMER in the last 72 hours. Hgb A1c No results for input(s): HGBA1C in the last 72 hours. Lipid Profile No results for input(s): CHOL, HDL, LDLCALC, TRIG, CHOLHDL, LDLDIRECT in the last 72 hours. Thyroid function studies No results for input(s): TSH, T4TOTAL, T3FREE, THYROIDAB in the last 72 hours.  Invalid input(s): FREET3 Anemia work up No results for input(s): VITAMINB12, FOLATE, FERRITIN, TIBC, IRON, RETICCTPCT in the last 72 hours. Urinalysis    Component Value Date/Time   COLORURINE YELLOW 01/11/2018 1226   APPEARANCEUR HAZY (A) 01/11/2018 1226   LABSPEC 1.014 01/11/2018 1226   PHURINE 6.0 01/11/2018 1226   GLUCOSEU NEGATIVE 01/11/2018 1226   HGBUR NEGATIVE 01/11/2018 1226   BILIRUBINUR NEGATIVE 01/11/2018 1226   KETONESUR NEGATIVE 01/11/2018 1226    PROTEINUR NEGATIVE 01/11/2018 1226   UROBILINOGEN 0.2 11/11/2014 0757   NITRITE NEGATIVE 01/11/2018 1226   LEUKOCYTESUR LARGE (A) 01/11/2018 1226   Sepsis Labs Invalid input(s): PROCALCITONIN,  WBC,  LACTICIDVEN Microbiology No results found for this or any previous visit (from the past 240 hour(s)).   Time coordinating discharge: Over 30 minutes  SIGNED:   Calvert Cantor, MD  [facility] 01/12/2018, 4:50 PM Pager   If 7PM-7AM, please contact night-coverage [email or web address] Password [email or web address]

## 2018-01-12 NOTE — Consult Note (Signed)
Neurology Consultation Reason for Consult: Seizures Referring Physician: Little, R  CC: Seizures  History is obtained from: Patient  HPI: Michelle Short is a 27 y.o. female  who is about [redacted] weeks pregnant who presents with new onset seizures.  Her boyfriend states that he woke to her shaking, describing her as having arms flexed legs outstretched.  This lasted for about 2 minutes.  Following this she initially began to wake up and then went unresponsive again.  She then woke up in the ambulance and was doing well in the emergency department.  Per the family, she returned to her normal self, without any concerns for confusion, or other symptoms.  She then received Ativan for MRI.  She subsequently had a recurrent generalized convulsive episode.  She had tongue biting and was postictal afterwards, but has been slowly improving though still very sleepy since the Ativan.  Her sister and boyfriend deny any knowledge of previous head injury, perinatal complications, or previous seizures.  She is very lethargic, but does arouse and answer questions.  She smoked some marijuana this morning, but denies any other drug use  ROS: A 14 point ROS was performed and is negative except as noted in the HPI.    Past Medical History:  Diagnosis Date  . Marijuana abuse     Family history: No history of seizures in the family   Social History:  reports that  has never smoked. she has never used smokeless tobacco. She reports that she uses drugs. Drug: Marijuana. She reports that she does not drink alcohol.   Exam: Current vital signs: BP 94/71   Pulse 86   Temp 98.4 F (36.9 C) (Oral)   Resp (!) 26   Ht 5\' 4"  (1.626 m)   Wt 48.1 kg (106 lb)   LMP 12/04/2017   SpO2 92%   BMI 18.19 kg/m  Vital signs in last 24 hours: Temp:  [98.4 F (36.9 C)] 98.4 F (36.9 C) (02/21 1057) Pulse Rate:  [78-108] 86 (02/21 2200) Resp:  [16-31] 26 (02/21 2200) BP: (91-117)/(59-88) 94/71 (02/21 2330) SpO2:   [92 %-100 %] 92 % (02/21 2200) Weight:  [48.1 kg (106 lb)] 48.1 kg (106 lb) (02/21 1105)   Physical Exam  Constitutional: Appears well-developed and well-nourished.  Psych: Affect appropriate to situation Eyes: No scleral injection HENT: No OP obstrucion Head: Normocephalic.  Cardiovascular: Normal rate and regular rhythm.  Respiratory: Effort normal, non-labored breathing GI: Soft.  No distension. There is no tenderness.  Skin: WDI  Neuro: Mental Status: Patient is awake, alert, oriented to person, place, month, year, and situation. Patient is able to give a clear and coherent history. No signs of aphasia or neglect Cranial Nerves: II: Visual Fields are full. Pupils are equal, round, and reactive to light.   III,IV, VI: EOMI without ptosis or diploplia.  V: Facial sensation is symmetric to temperature VII: Facial movement is symmetric.  VIII: hearing is intact to voice X: Uvula elevates symmetrically XI: Shoulder shrug is symmetric. XII: tongue is midline without atrophy or fasciculations.  Motor: Tone is normal. Bulk is normal. 5/5 strength was present in all four extremities.  Sensory: Sensation is symmetric to light touch and temperature in the arms and legs. Deep Tendon Reflexes: 2+ and symmetric in the biceps and patellae.  Plantars: Toes are downgoing bilaterally.  Cerebellar: FNF  intact bilaterally   I have reviewed labs in epic and the results pertinent to this consultation are: CMP-normal Sodium 137, creatinine 0.7  CBC-normal  Beta hCG 1300  I have reviewed the images obtained:MRI brain - normal.  Impression: 27 year old female with new onset seizures in the setting of early pregnancy. Clearly, this is not eclampsia given the early gestation.   My suspicion is that she may have underlying seizure predisposition, and would favor EEG to further assess this.  No signs of CNS infection or other malignant cause of seizures at this time.  With 2  seizures in such rapid succession, I do think that antiepileptic therapy is merited at this time.  Keppra appears to be 1 of the safer medications in pregnancy and is readily loadable and therefore I would recommend this at this time.  Recommendations: 1) EEG 2) continue Keppra 500 mg twice daily 3) neurology will continue to follow  Ritta SlotMcNeill Andrienne Havener, MD [facility] [phone]  If 7pm- 7am, please page neurology on call as listed in AMION.

## 2018-01-17 LAB — CULTURE, BLOOD (ROUTINE X 2)
Culture: NO GROWTH
Culture: NO GROWTH
SPECIAL REQUESTS: ADEQUATE
SPECIAL REQUESTS: ADEQUATE

## 2018-02-09 ENCOUNTER — Other Ambulatory Visit: Payer: Self-pay | Admitting: Obstetrics and Gynecology

## 2018-02-09 DIAGNOSIS — O3680X0 Pregnancy with inconclusive fetal viability, not applicable or unspecified: Secondary | ICD-10-CM

## 2018-02-12 ENCOUNTER — Other Ambulatory Visit: Payer: Self-pay

## 2018-02-12 ENCOUNTER — Encounter (INDEPENDENT_AMBULATORY_CARE_PROVIDER_SITE_OTHER): Payer: Self-pay

## 2018-02-12 ENCOUNTER — Ambulatory Visit (INDEPENDENT_AMBULATORY_CARE_PROVIDER_SITE_OTHER): Payer: Medicaid Other

## 2018-02-12 ENCOUNTER — Ambulatory Visit (INDEPENDENT_AMBULATORY_CARE_PROVIDER_SITE_OTHER): Payer: Medicaid Other | Admitting: Women's Health

## 2018-02-12 ENCOUNTER — Encounter: Payer: Self-pay | Admitting: Women's Health

## 2018-02-12 VITALS — BP 90/62 | HR 81 | Ht 64.75 in | Wt 103.0 lb

## 2018-02-12 DIAGNOSIS — O021 Missed abortion: Secondary | ICD-10-CM | POA: Diagnosis not present

## 2018-02-12 DIAGNOSIS — Z3A09 9 weeks gestation of pregnancy: Secondary | ICD-10-CM

## 2018-02-12 DIAGNOSIS — O3680X0 Pregnancy with inconclusive fetal viability, not applicable or unspecified: Secondary | ICD-10-CM

## 2018-02-12 NOTE — Patient Instructions (Signed)
FACTS YOU SHOULD KNOW  About Early Pregnancy Loss  WHAT IS AN EARLY PREGNANCY LOSS? Once the egg is fertilized with the sperm and begins to develop, it attaches to the lining of the uterus. This early pregnancy tissue may not develop into an embryo (the beginning stage of a baby). Sometimes an embryo does develop but does not continue to grow. These problems can be seen on ultrasound.   MANAGEMNT OF EARLY PREGNANCY LOSS: About 4 out of 100 (0.25%) women will have a pregnancy loss in her lifetime.  One in five pregnancies is found to be an early pregnancy loss.  There are 3 ways to care for an early pregnancy loss:   (1) Surgery, (2) Medicine, (3) Waiting for you to pass the pregnancy on your own. The decision as to how to proceed after being diagnosed with and early pregnancy loss is an individual one.  The decision can be made only after appropriate counseling.  You need to weigh the pros and cons of the 3 choices. Then you can make the choice that works for you.  SURGERY (D&E) . Procedure over in 1 day . Requires being put to sleep . Bleeding may be light . Possible problems during surgery, including injury to womb(uterus) . Care provider has more control Medicine (CYTOTEC) . The complete procedure may take days to weeks . No Surgery . Bleeding may be heavy at times . There may be drug side effects . Patient has more control Waiting . You may choose to wait, in which case your own body may complete the passing of the abnormal early pregnancy on its own in about 2-4 weeks . Your bleeding may be heavy at times . There is a small possibility that you may need surgery if the bleeding is too much or not all of the pregnancy has passed.  CYTOTEC MANAGEMENT Prostaglandins (cytotec) are the most widely used drug for this purpose. They cause the uterus to cramp and contract. You will place the medicine yourself inside your vagina in the privacy of your home. Empting of the uterus should occur  within 3 days but the process may continue for several weeks. The bleeding may seem heavy at times.  INSTRUCTIONS: Take all 4 tablets of cytotec (800mcg total) at one time. This will cause a lot of cramping, you may have bleeding, and pass tissue, then the cramping and bleeding should get better. If you do not pass the tissue, then you can take 4 more tablets of cytotec (800mcg total) 48 hours after your first dose.  You will come back to have your blood drawn to make sure the pregnancy hormones are dropping in 1 week. Please call us if you have any questions.   POSSIBLE SIDE EFFECTS FROM CYTOTEC . Nausea  Vomiting . Diarrhea Fever . Chills  Hot Flashes Side effects  from the process of the early pregnancy loss include: . Cramping  Bleeding . Headaches  Dizziness RISKS: This is a low risk procedure. Less than 1 in 100 women has a complication. An incomplete passage of the early pregnancy may occur. Also, hemorrhage (heavy bleeding) could happen.  Rarely the pregnancy will not be passed completely. Excessively heavy bleeding may occur.  Your doctor may need to perform surgery to empty the uterus (D&E). Afterwards: Everybody will feel differently after the early pregnancy loss completion. You may have soreness or cramps for a day or two. You may have soreness or cramps for day or two.  You may have light   bleeding for up to 2 weeks. You may be as active as you feel like being. If you have any of the following problems you may call [facility] at [phone] or Maternity Admissions Unit at [phone] if it is after hours. . If you have pain that does not get better with pain medication . Bleeding that soaks through 2 thick full-sized sanitary pads in an hour . Cramps that last longer than 2 days . Foul smelling discharge . Fever above 100.4 degrees F Even if you do not have any of these symptoms, you should have a follow-up exam to make sure you are healing properly. Your next normal period will  usually start again in 4-6 week after the loss. You can get pregnant soon after the loss, so use birth control right away. Finally: Make sure all your questions are answered before during and after any procedure. Follow up with medical care and family planning methods.      

## 2018-02-12 NOTE — Progress Notes (Signed)
US 9+1 wks IUP w/ys,no FHT,crl 24.77 mm,GS 4.91 cm,normal ovaries bilat,pt will come back at 2 pm to see Dr.Eure

## 2018-02-12 NOTE — Progress Notes (Signed)
   GYN VISIT Patient name: Michelle Short MRN [id number]  Date of birth: 1991/04/30 Chief Complaint:   Follow-up (u/s)  History of Present Illness:   Michelle Sheldonmani B Rinehimer is a 27 y.o. G1P0 African American female w/ unknown LMP being seen today for missed Ab. Had dating u/s today, GS c/w 7736w4d, CRL c/w 6064w1d, no FCA. Denies vb, cramping. Had 1st episode of vomiting today.   Went to ED 01/12/18 w/ new onset seizures- has appt w/ neuro in April.   No LMP recorded (lmp unknown). Patient is pregnant. The current method of family planning is none. Review of Systems:   Pertinent items are noted in HPI Denies fever/chills, dizziness, headaches, visual disturbances, fatigue, shortness of breath, chest pain, abdominal pain, vomiting, abnormal vaginal discharge/itching/odor/irritation, problems with periods, bowel movements, urination, or intercourse unless otherwise stated above.  Pertinent History Reviewed:  Reviewed past medical,surgical, social, obstetrical and family history.  Reviewed problem list, medications and allergies. Physical Assessment:   Vitals:   02/12/18 1400  BP: 90/62  Pulse: 81  Weight: 103 lb (46.7 kg)  Height: 5' 4.75" (1.645 m)  Body mass index is 17.27 kg/m.       Physical Examination:   General appearance: alert, well appearing, and in no distress  Mental status: alert, oriented to person, place, and time  Skin: warm & dry   Cardiovascular: normal heart rate noted  Respiratory: normal respiratory effort, no distress  Abdomen: soft, non-tender   Pelvic: examination not indicated  Extremities: no edema   US 9+1 wks IUP w/ys,no FHT,crl 24.77 mm,GS 4.91 cm,normal ovaries bilat,pt will come back at 2 pm to see Dr.Eure Assessment & Plan:  1) Missed Ab> CRL c/w 564w1d, discussed w/ [facility]- ok to offer cytotec. Discussed options of expectant management vs. cytotec vs. D&C which is usually held as last option d/t risks. Pt wants to think about options and let us know if she  decides for cytotec. Will get cbc, BHCG, ABO/Rh. Discussed what to expect w/ miscarriage as well as warning s/s to report, reasons to seek care. Plan f/u in 1wk.   Meds: No orders of the defined types were placed in this encounter.   Orders Placed This Encounter  Procedures  . CBC  . Beta hCG quant (ref lab)  . ABO/Rh    Return in about 1 week (around 02/19/2018) for F/U.  Cheral MarkerKimberly R Natilie Krabbenhoft CNM, [facility]NP[facility]BC 02/12/2018 2:45 PM

## 2018-02-13 ENCOUNTER — Encounter: Payer: Self-pay | Admitting: Women's Health

## 2018-02-13 LAB — CBC
HEMATOCRIT: 38 % (ref 34.0–46.6)
HEMOGLOBIN: 12.6 g/dL (ref 11.1–15.9)
MCH: 29.6 pg (ref 26.6–33.0)
MCHC: 33.2 g/dL (ref 31.5–35.7)
MCV: 89 fL (ref 79–97)
Platelets: 289 10*3/uL (ref 150–379)
RBC: 4.26 x10E6/uL (ref 3.77–5.28)
RDW: 13.7 % (ref 12.3–15.4)
WBC: 9.4 10*3/uL (ref 3.4–10.8)

## 2018-02-13 LAB — ABO/RH: RH TYPE: POSITIVE

## 2018-02-13 LAB — BETA HCG QUANT (REF LAB): hCG Quant: 146129 m[IU]/mL

## 2018-02-18 ENCOUNTER — Emergency Department (HOSPITAL_COMMUNITY): Payer: Medicaid Other

## 2018-02-18 ENCOUNTER — Emergency Department (HOSPITAL_COMMUNITY)
Admission: EM | Admit: 2018-02-18 | Discharge: 2018-02-18 | Disposition: A | Discharge status: Home / Self Care | Payer: Medicaid Other | Attending: Emergency Medicine | Admitting: Emergency Medicine

## 2018-02-18 ENCOUNTER — Encounter (HOSPITAL_COMMUNITY): Payer: Self-pay | Admitting: Emergency Medicine

## 2018-02-18 ENCOUNTER — Other Ambulatory Visit: Payer: Self-pay

## 2018-02-18 DIAGNOSIS — A5901 Trichomonal vulvovaginitis: Secondary | ICD-10-CM

## 2018-02-18 DIAGNOSIS — N760 Acute vaginitis: Secondary | ICD-10-CM

## 2018-02-18 DIAGNOSIS — O0289 Other abnormal products of conception: Secondary | ICD-10-CM | POA: Insufficient documentation

## 2018-02-18 DIAGNOSIS — O23599 Infection of other part of genital tract in pregnancy, unspecified trimester: Secondary | ICD-10-CM | POA: Diagnosis not present

## 2018-02-18 DIAGNOSIS — N939 Abnormal uterine and vaginal bleeding, unspecified: Secondary | ICD-10-CM | POA: Diagnosis present

## 2018-02-18 DIAGNOSIS — Z79899 Other long term (current) drug therapy: Secondary | ICD-10-CM | POA: Diagnosis not present

## 2018-02-18 DIAGNOSIS — Z3A08 8 weeks gestation of pregnancy: Secondary | ICD-10-CM | POA: Diagnosis not present

## 2018-02-18 DIAGNOSIS — B9689 Other specified bacterial agents as the cause of diseases classified elsewhere: Secondary | ICD-10-CM | POA: Diagnosis not present

## 2018-02-18 LAB — WET PREP, GENITAL
SPERM: NONE SEEN
Yeast Wet Prep HPF POC: NONE SEEN

## 2018-02-18 LAB — URINALYSIS, ROUTINE W REFLEX MICROSCOPIC
Bilirubin Urine: NEGATIVE
GLUCOSE, UA: NEGATIVE mg/dL
KETONES UR: NEGATIVE mg/dL
NITRITE: NEGATIVE
PH: 8 (ref 5.0–8.0)
Protein, ur: NEGATIVE mg/dL
SPECIFIC GRAVITY, URINE: 1.012 (ref 1.005–1.030)

## 2018-02-18 LAB — CBC
HCT: 36.3 % (ref 36.0–46.0)
HEMOGLOBIN: 11.8 g/dL — AB (ref 12.0–15.0)
MCH: 28.7 pg (ref 26.0–34.0)
MCHC: 32.5 g/dL (ref 30.0–36.0)
MCV: 88.3 fL (ref 78.0–100.0)
PLATELETS: 248 10*3/uL (ref 150–400)
RBC: 4.11 MIL/uL (ref 3.87–5.11)
RDW: 12.4 % (ref 11.5–15.5)
WBC: 6.9 10*3/uL (ref 4.0–10.5)

## 2018-02-18 LAB — HCG, QUANTITATIVE, PREGNANCY: HCG, BETA CHAIN, QUANT, S: 157493 m[IU]/mL — AB (ref <5)

## 2018-02-18 MED ORDER — METRONIDAZOLE 500 MG PO TABS: 500.0000 mg | ORAL_TABLET | Freq: Two times a day (BID) | ORAL | 0 refills | Status: DC

## 2018-02-18 NOTE — ED Notes (Signed)
Phlebotomy at bedside.

## 2018-02-18 NOTE — ED Notes (Signed)
EDP at bedside updating patient and family. 

## 2018-02-18 NOTE — Progress Notes (Signed)
Patient ID: Michelle Short, female   DOB: 1991/11/10, 27 y.o.   MRN: [id number] Patient case reviewed with Dr Clarene DukeMcManus, and I've left a note at the office for the patient to be called in the a.m. For an appt, so we can try again to review options such as cytotec, and clarify non viability of the pregnancy. Rh positive. Treated for Trich.

## 2018-02-18 NOTE — ED Triage Notes (Signed)
Patient c/o vaginal bleeding and low back pain. Per patient woke this morning with bleeding with clots. Patient states approx 9-[redacted] weeks pregnant. [facility]. Per patient had ultrasound on Monday and told she was having a possible miscarriage. Per patient first pregnancy.

## 2018-02-18 NOTE — ED Provider Notes (Signed)
[facility] EMERGENCY DEPARTMENT Provider Note   CSN: [id number] Arrival date & time: 02/18/18  7829     History   Chief Complaint Chief Complaint  Patient presents with  . Vaginal Bleeding    HPI Michelle Short is a 27 y.o. female.  HPI  Pt was seen at 1005. Per pt, c/o gradual onset and resolution of one episode of very light vaginal "spotting" that began approximately 0700 this morning. Hx G1P0, seen by OB MD 6 days ago, dx missed AB (unk LMP, [place] with CRL [redacted]w[redacted]d, no FHR). Denies pelvic pain, no dysuria/hematuria, no flank pain, no N/V/D, no fevers, no rash.    Past Medical History:  Diagnosis Date  . Marijuana abuse   . Seizures (HCC) 12/2017    Patient Active Problem List   Diagnosis Date Noted  . Seizure (HCC) 01/11/2018  . Missed abortion 01/11/2018  . Marijuana abuse   . Acute lower UTI     No past surgical history on file.   OB History    Gravida  1   Para      Term      Preterm      AB      Living        SAB      TAB      Ectopic      Multiple      Live Births               Home Medications    Prior to Admission medications   Medication Sig Start Date End Date Taking? Authorizing Provider  Cholecalciferol (VITAMIN D3) 3000 units TABS Take 3,000 Units by mouth daily.    [provider]  levETIRAcetam (KEPPRA) 500 MG tablet Take 1 tablet (500 mg total) by mouth 2 (two) times daily. 01/12/18   Calvert Cantor, MD  Prenatal Vit-Fe Fumarate-FA (PRENATAL MULTIVITAMIN) TABS tablet Take 1 tablet by mouth daily at 12 noon.    [provider]    Family History Family History  Problem Relation Age of Onset  . Hyperlipidemia Mother   . Diabetes Paternal Grandfather   . Heart attack Maternal Grandfather     Social History Social History   Tobacco Use  . Smoking status: Never Smoker  . Smokeless tobacco: Never Used  Substance Use Topics  . Alcohol use: No  . Drug use: Yes    Types: Marijuana    Comment:  sometimes      Allergies   Mosquito (diagnostic)   Review of Systems Review of Systems ROS: Statement: All systems negative except as marked or noted in the HPI; Constitutional: Negative for fever and chills. ; ; Eyes: Negative for eye pain, redness and discharge. ; ; ENMT: Negative for ear pain, hoarseness, nasal congestion, sinus pressure and sore throat. ; ; Cardiovascular: Negative for chest pain, palpitations, diaphoresis, dyspnea and peripheral edema. ; ; Respiratory: Negative for cough, wheezing and stridor. ; ; Gastrointestinal: Negative for nausea, vomiting, diarrhea, abdominal pain, blood in stool, hematemesis, jaundice and rectal bleeding. . ; ; Genitourinary: Negative for dysuria, flank pain and hematuria. ; ; GYN:  No pelvic pain,+vaginal spotting, no vaginal discharge, no vulvar pain. ;; Musculoskeletal: Negative for back pain and neck pain. Negative for swelling and trauma.; ; Skin: Negative for pruritus, rash, abrasions, blisters, bruising and skin lesion.; ; Neuro: Negative for headache, lightheadedness and neck stiffness. Negative for weakness, altered level of consciousness, altered mental status, extremity weakness, paresthesias, involuntary movement, seizure and  syncope.       Physical Exam Updated Vital Signs BP (!) 144/90 (BP Location: Left Arm)   Pulse 79   Temp 98.2 F (36.8 C) (Oral)   Resp 14   Ht 5' 4.75" (1.645 m)   Wt 46.7 kg (103 lb)   LMP 12/05/2017   SpO2 100%   BMI 17.27 kg/m   Physical Exam 1010: Physical examination:  Nursing notes reviewed; Vital signs and O2 SAT reviewed;  Constitutional: Well developed, Well nourished, Well hydrated, In no acute distress; Head:  Normocephalic, atraumatic; Eyes: EOMI, PERRL, No scleral icterus; ENMT: Mouth and pharynx normal, Mucous membranes moist; Neck: Supple, Full range of motion, No lymphadenopathy; Cardiovascular: Regular rate and rhythm, No gallop; Respiratory: Breath sounds clear & equal bilaterally, No  wheezes.  Speaking full sentences with ease, Normal respiratory effort/excursion; Chest: Nontender, Movement normal; Abdomen: Soft, Nontender, Nondistended, Normal bowel sounds; Genitourinary: No CVA tenderness. Pelvic exam performed with permission of pt and female ED tech assist during exam.  External genitalia w/o lesions. Vaginal vault without discharge, no bleeding.  Cervix closed, friable, w/o lesions, no bleeding, GC/chlam and wet prep obtained and sent to lab.  Bimanual exam w/o CMT, uterine or adnexal tenderness.; Extremities: Peripheral pulses normal, No tenderness, No edema, No calf edema or asymmetry.; Neuro: AA&Ox3, Major CN grossly intact.  Speech clear. No gross focal motor or sensory deficits in extremities.; Skin: Color normal, Warm, Dry.; Psych:  Affect flat, poor eye contact.    ED Treatments / Results  Labs (all labs ordered are listed, but only abnormal results are displayed)   EKG None  Radiology   Procedures Procedures (including critical care time)  Medications Ordered in ED Medications - No data to display   Initial Impression / Assessment and Plan / ED Course  I have reviewed the triage vital signs and the nursing notes.  Pertinent labs & imaging results that were available during my care of the patient were reviewed by me and considered in my medical decision making (see chart for details).  MDM Reviewed: previous chart, nursing note and vitals Reviewed previous: labs Interpretation: labs and ultrasound   Results for orders placed or performed during the hospital encounter of 02/18/18  Wet prep, genital  Result Value Ref Range   Yeast Wet Prep HPF POC NONE SEEN NONE SEEN   Trich, Wet Prep PRESENT (A) NONE SEEN   Clue Cells Wet Prep HPF POC PRESENT (A) NONE SEEN   WBC, Wet Prep HPF POC FEW (A) NONE SEEN   Sperm NONE SEEN   CBC  Result Value Ref Range   WBC 6.9 4.0 - 10.5 K/uL   RBC 4.11 3.87 - 5.11 MIL/uL   Hemoglobin 11.8 (L) 12.0 - 15.0 g/dL    HCT 40.9 81.1 - 91.4 %   MCV 88.3 78.0 - 100.0 fL   MCH 28.7 26.0 - 34.0 pg   MCHC 32.5 30.0 - 36.0 g/dL   RDW 78.2 95.6 - 21.3 %   Platelets 248 150 - 400 K/uL  hCG, quantitative, pregnancy  Result Value Ref Range   hCG, Beta Chain, Quant, S 157,493 (H) <5 mIU/mL  Urinalysis, Routine w reflex microscopic  Result Value Ref Range   Color, Urine YELLOW YELLOW   APPearance CLOUDY (A) CLEAR   Specific Gravity, Urine 1.012 1.005 - 1.030   pH 8.0 5.0 - 8.0   Glucose, UA NEGATIVE NEGATIVE mg/dL   Hgb urine dipstick SMALL (A) NEGATIVE   Bilirubin Urine NEGATIVE NEGATIVE  Ketones, ur NEGATIVE NEGATIVE mg/dL   Protein, ur NEGATIVE NEGATIVE mg/dL   Nitrite NEGATIVE NEGATIVE   Leukocytes, UA TRACE (A) NEGATIVE   RBC / HPF 0-5 0 - 5 RBC/hpf   WBC, UA 0-5 0 - 5 WBC/hpf   Bacteria, UA RARE (A) NONE SEEN   Squamous Epithelial / LPF 0-5 (A) NONE SEEN   Budding Yeast PRESENT    [place] Ob Comp Less 14 Wks Result Date: 02/18/2018 CLINICAL DATA:  27 year old pregnant female with vaginal bleeding. Estimated gestational age of [redacted] weeks 0 days by LMP. EXAM: OBSTETRIC <14 WK US AND TRANSVAGINAL OB US TECHNIQUE: Both transabdominal and transvaginal ultrasound examinations were performed for complete evaluation of the gestation as well as the maternal uterus, adnexal regions, and pelvic cul-de-sac. Transvaginal technique was performed to assess early pregnancy. COMPARISON:  None. FINDINGS: Intrauterine gestational sac: Single Yolk sac:  Echogenic, an indeterminate finding. Embryo:  Visualized. Cardiac Activity: Not Visualized. Heart Rate: Absent CRL:  21.5 mm   8 w   6 d Subchorionic hemorrhage:  None visualized. Maternal uterus/adnexae: The ovaries bilaterally are unremarkable. No free fluid or adnexal mass. IMPRESSION: 1. Single intrauterine gestational sac with embryo, however no cardiac activity identified which should be present in an embryo of this size - compatible with nonviable pregnancy/fetal demise.  Electronically Signed   By: Harmon PierJeffrey  Hu M.D.   On: 02/18/2018 11:22    02/12/2018 outpatient labs:  Ref Range & Units 6d ago  hCG Quant mIU/mL 146,129    Component 6d ago  ABO Grouping O   Rh Factor Positive      1350:  Will tx for BV and Trich. Pregnancy is non-viable; this explained to pt. T/C returned from OB/GYN Dr. Emelda FearFerguson, case discussed, including:  HPI, pertinent PM/SHx, VS/PE, dx testing, ED course and treatment:  Agrees with tx for BV and trich, have pt come to the office tomorrow morning at 8:30am to be seen in follow up, if she cannot get there by 8:30am she should call the office so she can be given a time to follow up tomorrow. Dx and testing, as well as d/w OB/GYN MD, d/w pt and family.  Questions answered.  Verb understanding, agreeable to d/c home with outpt f/u.   Final Clinical Impressions(s) / ED Diagnoses   Final diagnoses:  None    ED Discharge Orders    None       Samuel JesterMcManus, Taralyn Ferraiolo, DO 02/20/18 1313

## 2018-02-18 NOTE — ED Notes (Signed)
EDP at bedside  

## 2018-02-18 NOTE — ED Notes (Signed)
Pt was informed that we need a urine sample. Pt states that she can not urinate at this time. 

## 2018-02-18 NOTE — ED Notes (Signed)
Pt transported to US

## 2018-02-18 NOTE — Discharge Instructions (Addendum)
Take the prescription as directed. Go to your OB/GYN doctor's office tomorrow morning at 8:30am to be seen in follow up. They are expecting to see you. If you cannot get to the office at that time, call the office so they can schedule you to be seen at another time tomorrow.  Return to the Emergency Department immediately sooner if worsening.

## 2018-02-19 ENCOUNTER — Ambulatory Visit (INDEPENDENT_AMBULATORY_CARE_PROVIDER_SITE_OTHER): Payer: Medicaid Other | Admitting: Women's Health

## 2018-02-19 ENCOUNTER — Encounter: Payer: Self-pay | Admitting: Women's Health

## 2018-02-19 VITALS — BP 110/60 | HR 97 | Wt 103.4 lb

## 2018-02-19 DIAGNOSIS — O021 Missed abortion: Secondary | ICD-10-CM

## 2018-02-19 DIAGNOSIS — A599 Trichomoniasis, unspecified: Secondary | ICD-10-CM

## 2018-02-19 DIAGNOSIS — Z3A10 10 weeks gestation of pregnancy: Secondary | ICD-10-CM | POA: Diagnosis not present

## 2018-02-19 LAB — GC/CHLAMYDIA PROBE AMP (~~LOC~~) NOT AT ARMC
CHLAMYDIA, DNA PROBE: NEGATIVE
NEISSERIA GONORRHEA: NEGATIVE

## 2018-02-19 NOTE — Progress Notes (Signed)
GYN VISIT Patient name: Michelle Short MRN [id number]  Date of birth: 1991-08-06 Chief Complaint:   work-in-ob (se[facility] yesterday/ discuss optinions)  History of Present Illness:   Michelle Sheldonmani B Hoots is a 27 y.o. G1P0 African American female being seen today for f/u missed Ab. Had u/s 3/25 that reveaed GS c/w 8265w4d, CRL c/w 6391w1d w/o FCA. Gave options of expectant magement vs. cytotec vs. D&C. Chose expect[facility]. [facility]. Dx w/ BV & Trichomonas, rx'd metronidazole 500mg  BID x 7d, hasn't picked up yet, states she was unaware of trichomonas dx.  Had repeat u/s that revealed CRL c/w 272w6d w/o FCA, again confirming missed Ab.   No bleeding/cramping since Sat.  Labs 3/25: BHCG 146,129, O+  No LMP recorded (lmp unknown). Patient is pregnant. Review of Systems:   Pertinent items are noted in HPI Denies fever/chills, dizziness, headaches, visual disturbances, fatigue, shortness of breath, chest pain, abdominal pain, vomiting, abnormal vaginal discharge/itching/odor/irritation, problems with periods, bowel movements, urination, or intercourse unless otherwise stated above.  Pertinent History Reviewed:  Reviewed past medical,surgical, social, obstetrical and family history.  Reviewed problem list, medications and allergies. Physical Assessment:   Vitals:   02/19/18 1034  BP: 110/60  Pulse: 97  Weight: 103 lb 6.4 oz (46.9 kg)  Body mass index is 17.34 kg/m.       Physical Examination:   General appearance: alert, well appearing, and in no distress  Mental status: alert, oriented to person, place, and time  Skin: warm & dry   Cardiovascular: normal heart rate noted  Respiratory: normal respiratory effort, no distress  Abdomen: soft, non-tender   Pelvic: examination not indicated  Extremities: no edema   Results for orders placed or performed during the hospital encounter of 02/18/18 (from the past 24 hour(s))  Wet prep, genital   Collection  Time: 02/18/18 12:47 PM  Result Value Ref Range   Yeast Wet Prep HPF POC NONE SEEN NONE SEEN   Trich, Wet Prep PRESENT (A) NONE SEEN   Clue Cells Wet Prep HPF POC PRESENT (A) NONE SEEN   WBC, Wet Prep HPF POC FEW (A) NONE SEEN   Sperm NONE SEEN   Urinalysis, Routine w reflex microscopic   Collection Time: 02/18/18 12:47 PM  Result Value Ref Range   Color, Urine YELLOW YELLOW   APPearance CLOUDY (A) CLEAR   Specific Gravity, Urine 1.012 1.005 - 1.030   pH 8.0 5.0 - 8.0   Glucose, UA NEGATIVE NEGATIVE mg/dL   Hgb urine dipstick SMALL (A) NEGATIVE   Bilirubin Urine NEGATIVE NEGATIVE   Ketones, ur NEGATIVE NEGATIVE mg/dL   Protein, ur NEGATIVE NEGATIVE mg/dL   Nitrite NEGATIVE NEGATIVE   Leukocytes, UA TRACE (A) NEGATIVE   RBC / HPF 0-5 0 - 5 RBC/hpf   WBC, UA 0-5 0 - 5 WBC/hpf   Bacteria, UA RARE (A) NONE SEEN   Squamous Epithelial / LPF 0-5 (A) NONE SEEN   Budding Yeast PRESENT     Assessment & Plan:  1) Missed Ab> CRL c/w 5383w5d on latest u/s, again discussed options of continued expectant management x 1 more week vs. cytotec vs. D&C. Prefers expectant management x 1 more week. To let us know if wants cytotec prior to then. Again discussed what to expect w/ miscarriage, warning s/s, reasons to seek care. O+  2) Recent dx BV and trichomonas> hasn't picked up rx yet. Pt discussed w/ partner, wants me to tx. Rx called into  [facility] [address] [place] for [employer], DOB [birth year], allergies to nuts and amoxicillin. No etoh while taking. No sex until at least 7d from both finishing medicines. POC on pt in 3-4wks  Meds: No orders of the defined types were placed in this encounter.   No orders of the defined types were placed in this encounter.   Return for cancel Wed appt, f/u 1wk .  Cheral Marker CNM, [facility] 02/19/2018 11:30 AM

## 2018-02-20 LAB — URINE CULTURE
Culture: NO GROWTH
Special Requests: NORMAL

## 2018-02-21 ENCOUNTER — Ambulatory Visit: Payer: MEDICAID | Admitting: Women's Health

## 2018-02-22 ENCOUNTER — Encounter: Payer: Self-pay | Admitting: Obstetrics and Gynecology

## 2018-02-26 ENCOUNTER — Ambulatory Visit: Payer: MEDICAID | Admitting: Women's Health

## 2018-03-07 ENCOUNTER — Encounter: Payer: Self-pay | Admitting: Women's Health

## 2018-03-07 ENCOUNTER — Ambulatory Visit (INDEPENDENT_AMBULATORY_CARE_PROVIDER_SITE_OTHER): Payer: Medicaid Other | Admitting: Women's Health

## 2018-03-07 VITALS — BP 100/70 | HR 89 | Ht 64.0 in | Wt 107.0 lb

## 2018-03-07 DIAGNOSIS — Z3A08 8 weeks gestation of pregnancy: Secondary | ICD-10-CM | POA: Diagnosis not present

## 2018-03-07 DIAGNOSIS — O021 Missed abortion: Secondary | ICD-10-CM

## 2018-03-07 DIAGNOSIS — A599 Trichomoniasis, unspecified: Secondary | ICD-10-CM | POA: Diagnosis not present

## 2018-03-07 MED ORDER — HYDROCODONE-ACETAMINOPHEN 5-325 MG PO TABS: 1.0000 | ORAL_TABLET | Freq: Four times a day (QID) | ORAL | 0 refills | Status: DC | PRN

## 2018-03-07 MED ORDER — MISOPROSTOL 200 MCG PO TABS: 800.0000 ug | ORAL_TABLET | Freq: Once | ORAL | 1 refills | Status: DC

## 2018-03-07 NOTE — Progress Notes (Addendum)
GYN VISIT Patient name: Michelle Sheldonmani B Dreisbach MRN [id number]  Date of birth: [birth year] Chief Complaint:   Follow-up  History of Present Illness:   Michelle Short is a 27 y.o. G1P0 African American female being seen today for f/u missed Ab. Had u/s 3/25 that revealed GS c/w 6960w4d, CRL c/w 4521w1d w/o FCA, gave options, chose expectant management. Went to [facility] 3/30 w/ light vag spotting, dx w/ BV & trichomonas, had repeat u/s that revealed CRL c/w 6894w5d w/o FCA, again confirming missed Ab. Was seen for f/u w/ me on 4/1 at which time she wanted to give it one more week of expectant management. She missed her f/u appt the next week. Today states nothing has happened, no cramping/bleeding, no fever/chills. Would like to avoid surgery if possible. Has been having some anxiety, grandmother dx w/ stage 4 rectal cancer few days ago. Seeing neurologist 4/22 for new onset seizures.   No LMP recorded (lmp unknown). Patient is pregnant. The current method of family planning is none. Review of Systems:   Pertinent items are noted in HPI Denies fever/chills, dizziness, headaches, visual disturbances, fatigue, shortness of breath, chest pain, abdominal pain, vomiting, abnormal vaginal discharge/itching/odor/irritation, problems with periods, bowel movements, urination, or intercourse unless otherwise stated above.  Pertinent History Reviewed:  Reviewed past medical,surgical, social, obstetrical and family history.  Reviewed problem list, medications and allergies. Physical Assessment:   Vitals:   03/07/18 1152  BP: 100/70  Pulse: 89  Weight: 107 lb (48.5 kg)  Height: 5\' 4"  (1.626 m)  Body mass index is 18.37 kg/m.       Physical Examination:   General appearance: alert, well appearing, and in no distress  Mental status: alert, oriented to person, place, and time  Skin: warm & dry   Cardiovascular: normal heart rate noted  Respiratory: normal respiratory effort, no distress  Abdomen: soft, non-tender    Pelvic: examination not indicated  Extremities: no edema   No results found for this or any previous visit (from the past 24 hour(s)).  Assessment & Plan:  1) Missed Ab> CRL c/w 694w5d on latest u/s on 3/31, advised medical management w/ cytotec at this point. Rx cytotec 800mcg pv x1, repeat in 48hr if needed. If nothing happens after 2nd dosage, call us to let us know and will get worked in w/ MD to schedule D&C. Rx hydrocodone 5/325mg  #5. Discussed what to expect w/ miscarriage as well as warning s/s to report/reasons to seek care. O+. F/u 1wk.   2) Recent trichomonas> plan poc 3-4wks from treatment  3) Anxiety> discussed likely situational, as she has a lot going on in her life right now. If persists, let us know. Doesn't have PCP  Meds:  Meds ordered this encounter  Medications  . misoprostol (CYTOTEC) 200 MCG tablet    Sig: Place 4 tablets (800 mcg total) vaginally once for 1 dose. Repeat in 48 hours from first dose if needed    Dispense:  4 tablet    Refill:  1    Order Specific Question:   Supervising Provider    Answer:   Despina HiddenEURE, LUTHER H [[id number]]  . DISCONTD: HYDROcodone-acetaminophen (NORCO/VICODIN) 5-325 MG tablet    Sig: Take 1-2 tablets by mouth every 6 (six) hours as needed for moderate pain or severe pain.    Dispense:  5 tablet    Refill:  0    Order Specific Question:   Supervising Provider    Answer:   EURE, LUTHER H [[id number]]  .  HYDROcodone-acetaminophen (NORCO/VICODIN) 5-325 MG tablet    Sig: Take 1-2 tablets by mouth every 6 (six) hours as needed for moderate pain or severe pain.    Dispense:  5 tablet    Refill:  0    Order Specific Question:   Supervising Provider    Answer:   Despina Hidden, LUTHER H [[id number]]    No orders of the defined types were placed in this encounter.   Return in about 1 week (around 03/14/2018) for F/U.  Cheral Marker CNM, [facility] 03/07/2018 12:14 PM

## 2018-03-07 NOTE — Patient Instructions (Signed)
FACTS YOU SHOULD KNOW  About Early Pregnancy Loss  WHAT IS AN EARLY PREGNANCY LOSS? Once the egg is fertilized with the sperm and begins to develop, it attaches to the lining of the uterus. This early pregnancy tissue may not develop into an embryo (the beginning stage of a baby). Sometimes an embryo does develop but does not continue to grow. These problems can be seen on ultrasound.   MANAGEMNT OF EARLY PREGNANCY LOSS: About 4 out of 100 (0.25%) women will have a pregnancy loss in her lifetime.  One in five pregnancies is found to be an early pregnancy loss.  There are 3 ways to care for an early pregnancy loss:   (1) Surgery, (2) Medicine, (3) Waiting for you to pass the pregnancy on your own. The decision as to how to proceed after being diagnosed with and early pregnancy loss is an individual one.  The decision can be made only after appropriate counseling.  You need to weigh the pros and cons of the 3 choices. Then you can make the choice that works for you.  SURGERY (D&E) . Procedure over in 1 day . Requires being put to sleep . Bleeding may be light . Possible problems during surgery, including injury to womb(uterus) . Care provider has more control Medicine (CYTOTEC) . The complete procedure may take days to weeks . No Surgery . Bleeding may be heavy at times . There may be drug side effects . Patient has more control Waiting . You may choose to wait, in which case your own body may complete the passing of the abnormal early pregnancy on its own in about 2-4 weeks . Your bleeding may be heavy at times . There is a small possibility that you may need surgery if the bleeding is too much or not all of the pregnancy has passed.  CYTOTEC MANAGEMENT Prostaglandins (cytotec) are the most widely used drug for this purpose. They cause the uterus to cramp and contract. You will place the medicine yourself inside your vagina in the privacy of your home. Empting of the uterus should occur  within 3 days but the process may continue for several weeks. The bleeding may seem heavy at times.  INSTRUCTIONS: Take all 4 tablets of cytotec (800mcg total) at one time. This will cause a lot of cramping, you may have bleeding, and pass tissue, then the cramping and bleeding should get better. If you do not pass the tissue, then you can take 4 more tablets of cytotec (800mcg total) 48 hours after your first dose.  You will come back to have your blood drawn to make sure the pregnancy hormones are dropping in 1 week. Please call us if you have any questions.   POSSIBLE SIDE EFFECTS FROM CYTOTEC . Nausea  Vomiting . Diarrhea Fever . Chills  Hot Flashes Side effects  from the process of the early pregnancy loss include: . Cramping  Bleeding . Headaches  Dizziness RISKS: This is a low risk procedure. Less than 1 in 100 women has a complication. An incomplete passage of the early pregnancy may occur. Also, hemorrhage (heavy bleeding) could happen.  Rarely the pregnancy will not be passed completely. Excessively heavy bleeding may occur.  Your doctor may need to perform surgery to empty the uterus (D&E). Afterwards: Everybody will feel differently after the early pregnancy loss completion. You may have soreness or cramps for a day or two. You may have soreness or cramps for day or two.  You may have light   bleeding for up to 2 weeks. You may be as active as you feel like being. If you have any of the following problems you may call [facility] at [phone] or Maternity Admissions Unit at [phone] if it is after hours. . If you have pain that does not get better with pain medication . Bleeding that soaks through 2 thick full-sized sanitary pads in an hour . Cramps that last longer than 2 days . Foul smelling discharge . Fever above 100.4 degrees F Even if you do not have any of these symptoms, you should have a follow-up exam to make sure you are healing properly. Your next normal period will  usually start again in 4-6 week after the loss. You can get pregnant soon after the loss, so use birth control right away. Finally: Make sure all your questions are answered before during and after any procedure. Follow up with medical care and family planning methods.      

## 2018-03-08 ENCOUNTER — Other Ambulatory Visit: Payer: Self-pay

## 2018-03-08 ENCOUNTER — Encounter (HOSPITAL_COMMUNITY): Payer: Self-pay

## 2018-03-08 ENCOUNTER — Emergency Department (HOSPITAL_COMMUNITY)
Admission: EM | Admit: 2018-03-08 | Discharge: 2018-03-08 | Disposition: A | Discharge status: Home / Self Care | Payer: Medicaid Other | Attending: Emergency Medicine | Admitting: Emergency Medicine

## 2018-03-08 DIAGNOSIS — R569 Unspecified convulsions: Secondary | ICD-10-CM

## 2018-03-08 DIAGNOSIS — R51 Headache: Secondary | ICD-10-CM | POA: Diagnosis not present

## 2018-03-08 LAB — CBC
HEMATOCRIT: 35.6 % — AB (ref 36.0–46.0)
Hemoglobin: 11.6 g/dL — ABNORMAL LOW (ref 12.0–15.0)
MCH: 29.2 pg (ref 26.0–34.0)
MCHC: 32.6 g/dL (ref 30.0–36.0)
MCV: 89.7 fL (ref 78.0–100.0)
Platelets: 224 10*3/uL (ref 150–400)
RBC: 3.97 MIL/uL (ref 3.87–5.11)
RDW: 12.8 % (ref 11.5–15.5)
WBC: 5.7 10*3/uL (ref 4.0–10.5)

## 2018-03-08 LAB — BASIC METABOLIC PANEL
Anion gap: 10 (ref 5–15)
BUN: 8 mg/dL (ref 6–20)
CHLORIDE: 101 mmol/L (ref 101–111)
CO2: 26 mmol/L (ref 22–32)
CREATININE: 0.58 mg/dL (ref 0.44–1.00)
Calcium: 9.3 mg/dL (ref 8.9–10.3)
GFR calc Af Amer: >60 mL/min (ref >60)
GFR calc non Af Amer: >60 mL/min (ref >60)
Glucose, Bld: 77 mg/dL (ref 65–99)
Potassium: 3.6 mmol/L (ref 3.5–5.1)
Sodium: 137 mmol/L (ref 135–145)

## 2018-03-08 MED ORDER — LEVETIRACETAM IN NACL 1000 MG/100ML IV SOLN
1000.0000 mg | Freq: Once | INTRAVENOUS | Status: AC
  Administered 2018-03-08: 1000 mg via INTRAVENOUS
  Filled 2018-03-08: qty 100

## 2018-03-08 MED ORDER — LEVETIRACETAM 500 MG PO TABS: 500.0000 mg | ORAL_TABLET | Freq: Two times a day (BID) | ORAL | 6 refills | Status: DC

## 2018-03-08 NOTE — ED Notes (Signed)
Phlebotomy at bedside.

## 2018-03-08 NOTE — ED Triage Notes (Signed)
Pt has been out of her seizure meds for 2 weeks, had a witnessed seizure this am.  Pt is awake, but appears post-ictal

## 2018-03-08 NOTE — ED Provider Notes (Signed)
[facility] EMERGENCY DEPARTMENT Provider Note   CSN: [id number] Arrival date & time: 03/08/18  [id number]     History   Chief Complaint Chief Complaint  Patient presents with  . Seizures    HPI Michelle Short is a 27 y.o. female.  Patient presents to the emergency department for evaluation of seizure.  Patient had a brief, self-limited seizure at home today.  Seizure reportedly lasted approximately 1 minute and then stopped.  She did bite her tongue, no fall or other injury.  Patient reports that she has been off of her Keppra for approximately 2 weeks.  She also has multiple social stressors including family problems and she is currently going through a miscarriage.  She reports slight headache, no other complaints currently.  EMS report that she was postictal initially, but at arrival to the ER she is awake, alert and at her normal baseline.     Past Medical History:  Diagnosis Date  . Marijuana abuse   . Seizures (HCC) 12/2017    Patient Active Problem List   Diagnosis Date Noted  . Trichomonas infection 02/19/2018  . Seizure (HCC) 01/11/2018  . Missed abortion 01/11/2018  . Marijuana abuse   . Acute lower UTI     History reviewed. No pertinent surgical history.   OB History    Gravida  1   Para      Term      Preterm      AB      Living        SAB      TAB      Ectopic      Multiple      Live Births               Home Medications    Prior to Admission medications   Medication Sig Start Date End Date Taking? Authorizing Provider  Cholecalciferol (VITAMIN D3) 3000 units TABS Take 3,000 Units by mouth daily.    [provider]  HYDROcodone-acetaminophen (NORCO/VICODIN) 5-325 MG tablet Take 1-2 tablets by mouth every 6 (six) hours as needed for moderate pain or severe pain. 03/07/18   Cheral MarkerBooker, Kimberly R, CNM  levETIRAcetam (KEPPRA) 500 MG tablet Take 1 tablet (500 mg total) by mouth 2 (two) times daily. 03/08/18   Gilda CreasePollina, Charman Blasco  J, MD  metroNIDAZOLE (FLAGYL) 500 MG tablet Take 1 tablet (500 mg total) by mouth 2 (two) times daily. Patient not taking: Reported on 02/19/2018 02/18/18   Samuel JesterMcManus, Kathleen, DO  misoprostol (CYTOTEC) 200 MCG tablet Place 4 tablets (800 mcg total) vaginally once for 1 dose. Repeat in 48 hours from first dose if needed 03/07/18 03/07/18  Cheral MarkerBooker, Kimberly R, CNM  Prenatal Vit-Fe Fumarate-FA (PRENATAL MULTIVITAMIN) TABS tablet Take 1 tablet by mouth daily at 12 noon.    [provider]    Family History Family History  Problem Relation Age of Onset  . Hyperlipidemia Mother   . Diabetes Paternal Grandfather   . Heart attack Maternal Grandfather     Social History Social History   Tobacco Use  . Smoking status: Never Smoker  . Smokeless tobacco: Never Used  Substance Use Topics  . Alcohol use: No  . Drug use: Yes    Types: Marijuana    Comment: sometimes      Allergies   Mosquito (diagnostic)   Review of Systems Review of Systems  Neurological: Positive for seizures and headaches.  All other systems reviewed and are negative.  Physical Exam Updated Vital Signs BP 109/76   Pulse 85   Temp 98.9 F (37.2 C) (Oral)   Resp 15   Ht 5\' 4"  (1.626 m)   Wt 48.5 kg (107 lb)   LMP  (Approximate)   SpO2 100%   BMI 18.37 kg/m   Physical Exam  Constitutional: She is oriented to person, place, and time. She appears well-developed and well-nourished. No distress.  HENT:  Head: Normocephalic and atraumatic.  Right Ear: Hearing normal.  Left Ear: Hearing normal.  Nose: Nose normal.  Mouth/Throat: Oropharynx is clear and moist and mucous membranes are normal.  Abrasion left side of tongue  Eyes: Pupils are equal, round, and reactive to light. Conjunctivae and EOM are normal.  Neck: Normal range of motion. Neck supple.  Cardiovascular: Regular rhythm, S1 normal and S2 normal. Exam reveals no gallop and no friction rub.  No murmur heard. Pulmonary/Chest: Effort normal  and breath sounds normal. No respiratory distress. She exhibits no tenderness.  Abdominal: Soft. Normal appearance and bowel sounds are normal. There is no hepatosplenomegaly. There is no tenderness. There is no rebound, no guarding, no tenderness at McBurney's point and negative Murphy's sign. No hernia.  Musculoskeletal: Normal range of motion.  Neurological: She is alert and oriented to person, place, and time. She has normal strength. No cranial nerve deficit or sensory deficit. Coordination normal. GCS eye subscore is 4. GCS verbal subscore is 5. GCS motor subscore is 6.  Skin: Skin is warm, dry and intact. No rash noted. No cyanosis.  Psychiatric: She has a normal mood and affect. Her speech is normal and behavior is normal. Thought content normal.  Nursing note and vitals reviewed.    ED Treatments / Results  Labs (all labs ordered are listed, but only abnormal results are displayed) Labs Reviewed  CBC  BASIC METABOLIC PANEL    EKG EKG Interpretation  Date/Time:  Thursday March 08 2018 06:30:35 EDT Ventricular Rate:  81 PR Interval:    QRS Duration: 85 QT Interval:  354 QTC Calculation: 411 R Axis:   61 Text Interpretation:  Sinus rhythm RSR' in V1 or V2, probably normal variant Confirmed by Gilda Crease [id number]) on 03/08/2018 6:42:07 AM   Radiology No results found.  Procedures Procedures (including critical care time)  Medications Ordered in ED Medications  levETIRAcetam (KEPPRA) IVPB 1000 mg/100 mL premix (has no administration in time range)     Initial Impression / Assessment and Plan / ED Course  I have reviewed the triage vital signs and the nursing notes.  Pertinent labs & imaging results that were available during my care of the patient were reviewed by me and considered in my medical decision making (see chart for details).     Patient with history of seizures presents to the ER after a single, brief and self-limited seizure.  Seizure was  generalized tonic-clonic followed by a postictal state.  She is currently back to her normal baseline without any complaints.  She has multiple stressors that likely led to the seizure, as well as the fact that she is off of her Keppra.  Patient will be given IV Keppra load and will be restarted on her oral Keppra, follow-up as needed.  Final Clinical Impressions(s) / ED Diagnoses   Final diagnoses:  Seizure [facility])    ED Discharge Orders        Ordered    levETIRAcetam (KEPPRA) 500 MG tablet  2 times daily     03/08/18 0646  Gilda Crease, MD 03/08/18 [phone]

## 2018-03-12 ENCOUNTER — Ambulatory Visit: Payer: Medicaid Other | Admitting: Neurology

## 2018-03-12 ENCOUNTER — Encounter: Payer: Self-pay | Admitting: Neurology

## 2018-03-12 VITALS — BP 107/71 | HR 92 | Ht 64.0 in | Wt 111.6 lb

## 2018-03-12 DIAGNOSIS — R569 Unspecified convulsions: Secondary | ICD-10-CM | POA: Diagnosis not present

## 2018-03-12 MED ORDER — FOLIC ACID 1 MG PO TABS: 1.0000 mg | ORAL_TABLET | Freq: Every day | ORAL | 3 refills | Status: DC

## 2018-03-12 NOTE — Progress Notes (Addendum)
Reason for visit: Seizures  [facility]  Michelle Short is a 27 y.o. female  History of present illness:  Michelle Short is a 27 year old right-handed black female with a history of seizures with onset on 11 January 2018.  The patient indicates that she never had seizures prior to this but on the day of admission to the hospital on 21 February, she had 2 seizures at home and 5 in the hospital.  The patient did have tongue biting, she did not lose control of the bowels or the bladder.  The patient was confused for some time after the seizure events.  The patient had no warning of the seizure.  She has been smoking marijuana on a regular basis, but she denies drinking alcohol or doing other drugs such as cocaine.  She has no family history of tremor.  She indicates that when she was [age] years old when she fell out of a tree and hit her head but was not rendered unconscious.  She does have some occasional headaches, on average every other day since the admission to the hospital in February 2019.  She reports no focal numbness or weakness of the face, arms, legs.  She denies any issues controlling the bowels or the bladder or difficulty with balance.  She does report some anxiety problems.  She does not have a primary care physician.  She is sent to this office for an evaluation.  She is on Keppra 500 mg daily, an EEG study done in the hospital showed some left temporal slowing, MRI of the brain was normal.  Urine drug screen was positive for THC otherwise unremarkable.  Past Medical History:  Diagnosis Date  . Marijuana abuse   . Seizures (HCC) 12/2017    History reviewed. No pertinent surgical history.  Family History  Problem Relation Age of Onset  . Hyperlipidemia Mother   . Diabetes Paternal Grandfather   . Heart attack Maternal Grandfather     Social history:  reports that she has never smoked. She has never used smokeless tobacco. She reports that she has  current or past drug history. Drug: Marijuana. She reports that she does not drink alcohol.  Medications:  Prior to Admission medications   Medication Sig Start Date End Date Taking? Authorizing Provider  HYDROcodone-acetaminophen (NORCO/VICODIN) 5-325 MG tablet Take 1-2 tablets by mouth every 6 (six) hours as needed for moderate pain or severe pain. 03/07/18  Yes Cheral MarkerBooker, Kimberly R, CNM  levETIRAcetam (KEPPRA) 500 MG tablet Take 1 tablet (500 mg total) by mouth 2 (two) times daily. 03/08/18  Yes Pollina, Canary Brimhristopher J, MD      Allergies  Allergen Reactions  . Mosquito (Diagnostic)     ROS:  Out of a complete 14 system review of symptoms, the patient complains only of the following symptoms, and all other reviewed systems are negative.  Headache, seizures  Blood pressure 107/71, pulse 92, height 5\' 4"  (1.626 m), weight 111 lb 9 oz (50.6 kg), unknown if currently breastfeeding.  Physical Exam  General: The patient is alert and cooperative at the time of the examination.  Eyes: Pupils are equal, round, and reactive to light. Discs are flat bilaterally.  Neck: The neck is supple, no carotid bruits are noted.  Respiratory: The respiratory examination is clear.  Cardiovascular: The cardiovascular examination reveals a regular rate and rhythm, no obvious murmurs or rubs are noted.  Skin: Extremities are without significant edema.  Neurologic Exam  Mental status: The  patient is alert and oriented x 3 at the time of the examination. The patient has apparent normal recent and remote memory, with an apparently normal attention span and concentration ability.  Cranial nerves: Facial symmetry is present. There is good sensation of the face to pinprick and soft touch bilaterally. The strength of the facial muscles and the muscles to head turning and shoulder shrug are normal bilaterally. Speech is well enunciated, no aphasia or dysarthria is noted. Extraocular movements are full. Visual  fields are full. The tongue is midline, and the patient has symmetric elevation of the soft palate. No obvious hearing deficits are noted.  Motor: The motor testing reveals 5 over 5 strength of all 4 extremities. Good symmetric motor tone is noted throughout.  Sensory: Sensory testing is intact to pinprick, soft touch, vibration sensation, and position sense on all 4 extremities. No evidence of extinction is noted.  Coordination: Cerebellar testing reveals good finger-nose-finger and heel-to-shin bilaterally.  Gait and station: Gait is normal. Tandem gait is normal. Romberg is negative. No drift is seen.  Reflexes: Deep tendon reflexes are symmetric and normal bilaterally. Toes are downgoing bilaterally.   MRI brain/ MRV 01/11/18:  IMPRESSION: 1. Normal MRI of the brain. No structural cause of seizure identified. 2. Normal MRV of the head.  * MRI scan images were reviewed online. I agree with the written report.   EEG 01/12/18:  Impression: This awake and asleep EEG is abnormal due to occasional focal slowing over the left temporal region.  Clinical Correlation of the above findings indicates focal cerebral dysfunction over the left temporal region suggestive of underlying structural or physiologic abnormality. The absence of epileptiform discharges does not exclude a clinical diagnosis of epilepsy. Clinical correlation is advised.    Assessment/Plan:  1.  New onset seizures  The patient is not to operate a motor vehicle for at least 6 months following the last seizure event.  She will remain on Keppra, she was seen in the hospital on 08 March 2018 when she ran out of her Keppra medication, she has not had any seizures on Keppra.  The patient will follow-up in 6 months.  She has a prescription for Keppra with refills.  Michelle Palau MD 03/12/2018 2:07 PM  [facility] [address] [place], [place] [address]  Phone [phone] Fax  [phone]

## 2018-03-12 NOTE — Patient Instructions (Signed)
   No driving until further notice. 

## 2018-03-14 ENCOUNTER — Ambulatory Visit: Payer: Medicaid Other | Admitting: Women's Health

## 2018-03-22 ENCOUNTER — Ambulatory Visit (INDEPENDENT_AMBULATORY_CARE_PROVIDER_SITE_OTHER): Payer: Medicaid Other | Admitting: Women's Health

## 2018-03-22 ENCOUNTER — Encounter: Payer: Self-pay | Admitting: Women's Health

## 2018-03-22 ENCOUNTER — Other Ambulatory Visit: Payer: Self-pay

## 2018-03-22 VITALS — BP 100/64 | HR 101 | Ht 64.0 in | Wt 109.0 lb

## 2018-03-22 DIAGNOSIS — O039 Complete or unspecified spontaneous abortion without complication: Secondary | ICD-10-CM

## 2018-03-22 DIAGNOSIS — Z30013 Encounter for initial prescription of injectable contraceptive: Secondary | ICD-10-CM

## 2018-03-22 DIAGNOSIS — A599 Trichomoniasis, unspecified: Secondary | ICD-10-CM

## 2018-03-22 LAB — POCT WET PREP (WET MOUNT)
CLUE CELLS WET PREP WHIFF POC: NEGATIVE
TRICHOMONAS WET PREP HPF POC: ABSENT

## 2018-03-22 MED ORDER — MEDROXYPROGESTERONE ACETATE 150 MG/ML IM SUSP: 150.0000 mg | INTRAMUSCULAR | 3 refills | Status: DC

## 2018-03-22 NOTE — Progress Notes (Signed)
   GYN VISIT Patient name: Michelle Short MRN [id number]  Date of birth: [birth year] Chief Complaint:   Follow-up (SAB)  History of Present Illness:   Michelle Short is a 27 y.o. G1P0 African American female being seen today for f/u missed Ab dx 3/25. Initially chose expectant management for a couple of weeks, then took cytotec last week, passed pregnancy 03/14/18, still having some light bleeding, cramping. Tx for trichomonas 4/1. Denies abnormal discharge, itching/odor/irritation.  Does not desire another pregnancy right now, wants depo. Has not had sex since prior to passing POC.     No LMP recorded.  Last pap 2017 @ [facility]. Results were:  normal Review of Systems:   Pertinent items are noted in HPI Denies fever/chills, dizziness, headaches, visual disturbances, fatigue, shortness of breath, chest pain, abdominal pain, vomiting, abnormal vaginal discharge/itching/odor/irritation, problems with periods, bowel movements, urination, or intercourse unless otherwise stated above.  Pertinent History Reviewed:  Reviewed past medical,surgical, social, obstetrical and family history.  Reviewed problem list, medications and allergies. Physical Assessment:   Vitals:   03/22/18 1425  BP: 100/64  Pulse: (!) 101  Weight: 109 lb (49.4 kg)  Height:  (1.626 m)  Body mass index is 18.71 kg/m.       Physical Examination:   General appearance: alert, well appearing, and in no distress  Mental status: alert, oriented to person, place, and time  Skin: warm & dry   Cardiovascular: normal heart rate noted  Respiratory: normal respiratory effort, no distress  Abdomen: soft, non-tender   Pelvic: VULVA: normal appearing vulva with no masses, tenderness or lesions, VAGINA: normal appearing vagina with normal color and small amt menstrual colored, nonodorous discharge, no lesions, CERVIX: normal appearing cervix without discharge or lesions  Extremities: no edema   Results for orders placed or  performed in visit on 03/22/18 (from the past 24 hour(s))  POCT Wet Prep [facility])   Collection Time: 03/22/18  3:50 PM  Result Value Ref Range   Source Wet Prep POC vaginal    WBC, Wet Prep HPF POC none    Bacteria Wet Prep HPF POC Few Few   BACTERIA WET PREP MORPHOLOGY POC     Clue Cells Wet Prep HPF POC None None   Clue Cells Wet Prep Whiff POC Negative Whiff    Yeast Wet Prep HPF POC None    KOH Wet Prep POC     Trichomonas Wet Prep HPF POC Absent Absent    Assessment & Plan:  1) Completed SAB> repeat BHCG today, follow q 1-2wks until <5  2) Contraception management> rx depo, get 1st dose today, condoms x 2wks  3) Trichomonas POC neg  Meds:  Meds ordered this encounter  Medications  . medroxyPROGESTERone (DEPO-PROVERA) 150 MG/ML injection    Sig: Inject 1 mL (150 mg total) into the muscle every 3 (three) months.    Dispense:  1 mL    Refill:  3    Order Specific Question:   Supervising Provider    Answer:   Duane Lope H [[id number]]    Orders Placed This Encounter  Procedures  . Beta hCG quant (ref lab)  . POCT Wet Prep [facility])    Return for today for depo, please pap records from [facility].  Cheral Marker CNM, [facility] 03/22/2018 3:50 PM

## 2018-03-22 NOTE — Patient Instructions (Signed)

## 2018-03-23 LAB — BETA HCG QUANT (REF LAB): hCG Quant: 806 m[IU]/mL

## 2018-03-26 ENCOUNTER — Ambulatory Visit: Payer: Medicaid Other

## 2018-04-05 ENCOUNTER — Telehealth: Payer: Self-pay | Admitting: Neurology

## 2018-04-05 MED ORDER — DEXAMETHASONE 2 MG PO TABS: ORAL_TABLET | ORAL | 0 refills | Status: DC

## 2018-04-05 NOTE — Telephone Encounter (Signed)
The patient indicates that she has had a cycle of headaches on and off recently, she initially was seen for seizures, I will give her a 3-day course of dexamethasone, if this is not effective, she will contact our office and we will consider initiation of treatment with propranolol or another preventative medication.

## 2018-04-05 NOTE — Telephone Encounter (Signed)
Pt called she is having migraines 2-3 days at a time and stop for 2-3 days and start again for the past week. This is on the left side of the head only. She has tried tylenol with no relief.

## 2018-04-06 ENCOUNTER — Encounter (HOSPITAL_COMMUNITY): Payer: Self-pay | Admitting: Emergency Medicine

## 2018-04-06 ENCOUNTER — Emergency Department (HOSPITAL_COMMUNITY): Payer: Medicaid Other

## 2018-04-06 ENCOUNTER — Emergency Department (HOSPITAL_COMMUNITY)
Admission: EM | Admit: 2018-04-06 | Discharge: 2018-04-06 | Disposition: A | Discharge status: Home / Self Care | Payer: Medicaid Other | Attending: Emergency Medicine | Admitting: Emergency Medicine

## 2018-04-06 DIAGNOSIS — O3680X1 Pregnancy with inconclusive fetal viability, fetus 1: Secondary | ICD-10-CM | POA: Diagnosis not present

## 2018-04-06 DIAGNOSIS — N939 Abnormal uterine and vaginal bleeding, unspecified: Secondary | ICD-10-CM

## 2018-04-06 DIAGNOSIS — R102 Pelvic and perineal pain: Secondary | ICD-10-CM

## 2018-04-06 DIAGNOSIS — Z3A08 8 weeks gestation of pregnancy: Secondary | ICD-10-CM | POA: Diagnosis not present

## 2018-04-06 DIAGNOSIS — O0289 Other abnormal products of conception: Secondary | ICD-10-CM

## 2018-04-06 DIAGNOSIS — Z79899 Other long term (current) drug therapy: Secondary | ICD-10-CM | POA: Insufficient documentation

## 2018-04-06 HISTORY — DX: Headache: R51

## 2018-04-06 HISTORY — DX: Headache, unspecified: R51.9

## 2018-04-06 LAB — CBC WITH DIFFERENTIAL/PLATELET
Basophils Absolute: 0 10*3/uL (ref 0.0–0.1)
Basophils Relative: 0 %
Eosinophils Absolute: 0.1 10*3/uL (ref 0.0–0.7)
Eosinophils Relative: 1 %
HEMATOCRIT: 37.6 % (ref 36.0–46.0)
HEMOGLOBIN: 12.4 g/dL (ref 12.0–15.0)
LYMPHS PCT: 17 %
Lymphs Abs: 1.1 10*3/uL (ref 0.7–4.0)
MCH: 29.9 pg (ref 26.0–34.0)
MCHC: 33 g/dL (ref 30.0–36.0)
MCV: 90.6 fL (ref 78.0–100.0)
MONO ABS: 0.6 10*3/uL (ref 0.1–1.0)
MONOS PCT: 9 %
NEUTROS ABS: 5 10*3/uL (ref 1.7–7.7)
NEUTROS PCT: 73 %
Platelets: 250 10*3/uL (ref 150–400)
RBC: 4.15 MIL/uL (ref 3.87–5.11)
RDW: 12.7 % (ref 11.5–15.5)
WBC: 6.8 10*3/uL (ref 4.0–10.5)

## 2018-04-06 LAB — BASIC METABOLIC PANEL
ANION GAP: 7 (ref 5–15)
BUN: 11 mg/dL (ref 6–20)
CALCIUM: 9.1 mg/dL (ref 8.9–10.3)
CHLORIDE: 102 mmol/L (ref 101–111)
CO2: 28 mmol/L (ref 22–32)
CREATININE: 0.57 mg/dL (ref 0.44–1.00)
GFR calc Af Amer: >60 mL/min (ref >60)
GFR calc non Af Amer: >60 mL/min (ref >60)
GLUCOSE: 85 mg/dL (ref 65–99)
Potassium: 3.3 mmol/L — ABNORMAL LOW (ref 3.5–5.1)
Sodium: 137 mmol/L (ref 135–145)

## 2018-04-06 LAB — HCG, QUANTITATIVE, PREGNANCY: hCG, Beta Chain, Quant, S: 430 m[IU]/mL — ABNORMAL HIGH (ref <5)

## 2018-04-06 MED ORDER — MISOPROSTOL 100 MCG PO TABS
800.0000 ug | ORAL_TABLET | Freq: Once | ORAL | Status: AC
  Administered 2018-04-06: 800 ug via ORAL
  Filled 2018-04-06: qty 8

## 2018-04-06 MED ORDER — MISOPROSTOL 200 MCG PO TABS: 800.0000 ug | ORAL_TABLET | Freq: Once | ORAL | 0 refills | Status: DC

## 2018-04-06 MED ORDER — HYDROCODONE-ACETAMINOPHEN 5-325 MG PO TABS: ORAL_TABLET | ORAL | 0 refills | Status: DC

## 2018-04-06 MED ORDER — IBUPROFEN 800 MG PO TABS
800.0000 mg | ORAL_TABLET | Freq: Once | ORAL | Status: DC
  Filled 2018-04-06: qty 1

## 2018-04-06 NOTE — ED Notes (Signed)
Pt transported to US

## 2018-04-06 NOTE — Discharge Instructions (Signed)
As discussed, Dr. Despina Hidden will see you in his office on Tuesday 5/21.  You can call the office to arrange an appt time.  Repeat the cytotec in 24 hrs if needed.  You may experience heavy bleeding, cramping and nausea until the products are expelled.  Follow-up with [facility] during the weekend if needed.

## 2018-04-06 NOTE — ED Triage Notes (Addendum)
Pt reports she had miscarriage on 02/18/18. States she was 8 weeks. Pt has been cramping and bleeding since then. States she woke up at 3 am with worseing severe abd cramping and dark bleeding. States has hx of seizures and face has been tingling

## 2018-04-06 NOTE — ED Provider Notes (Signed)
[facility] EMERGENCY DEPARTMENT Provider Note   CSN: [id number] Arrival date & time: 04/06/18  4098     History   Chief Complaint Chief Complaint  Patient presents with  . Abdominal Pain    HPI Michelle Short is a 27 y.o. female.  HPI   Michelle Short is a 27 y.o. female G1P0 who presents to the Emergency Department complaining of lower abdominal cramping and vaginal bleeding since missed Ab, dx 3/25.  She has been followed at [facility] for this and prescribed Cytotec on 03/07/18 per the office note.  She states the cramping has been waxing and waning, but vaginal bleeding has been persistent.  Woke this morning at 3 AM with worsening pelvic pain and vomited x 2. Bleeding has been slightly worse. Tolerating po fluids.  She describes it as lighter than her normal menses, using 2-3 pads per day.  She denies general weakness, dizziness, fever, chills, decreased appetite, and dysuria.     Past Medical History:  Diagnosis Date  . Headache   . Marijuana abuse   . Seizures (HCC) 12/2017    Patient Active Problem List   Diagnosis Date Noted  . Trichomonas infection 02/19/2018  . Seizure (HCC) 01/11/2018  . Missed abortion 01/11/2018  . Marijuana abuse   . Acute lower UTI     History reviewed. No pertinent surgical history.   OB History    Gravida  1   Para      Term      Preterm      AB  1   Living        SAB  1   TAB      Ectopic      Multiple      Live Births               Home Medications    Prior to Admission medications   Medication Sig Start Date End Date Taking? Authorizing Provider  dexamethasone (DECADRON) 2 MG tablet Take 3 tablets the first day, 2 the second and 1 the third day 04/05/18   York Spaniel, MD  folic acid (FOLVITE) 1 MG tablet Take 1 tablet (1 mg total) by mouth daily. Patient not taking: Reported on 03/22/2018 03/12/18   York Spaniel, MD  HYDROcodone-acetaminophen (NORCO/VICODIN) 5-325 MG tablet Take 1-2  tablets by mouth every 6 (six) hours as needed for moderate pain or severe pain. 03/07/18   Cheral Marker, CNM  levETIRAcetam (KEPPRA) 500 MG tablet Take 1 tablet (500 mg total) by mouth 2 (two) times daily. 03/08/18   Gilda Crease, MD  medroxyPROGESTERone (DEPO-PROVERA) 150 MG/ML injection Inject 1 mL (150 mg total) into the muscle every 3 (three) months. 03/22/18   Cheral Marker, CNM    Family History Family History  Problem Relation Age of Onset  . Hyperlipidemia Mother   . Diabetes Paternal Grandfather   . Heart attack Maternal Grandfather     Social History Social History   Tobacco Use  . Smoking status: Never Smoker  . Smokeless tobacco: Never Used  Substance Use Topics  . Alcohol use: No  . Drug use: Yes    Types: Marijuana    Comment: sometimes      Allergies   Mosquito (diagnostic)   Review of Systems Review of Systems  Constitutional: Negative for appetite change, chills and fever.  Respiratory: Negative for shortness of breath.   Cardiovascular: Negative for chest pain.  Gastrointestinal: Positive for abdominal  pain and vomiting. Negative for abdominal distention, blood in stool and nausea.  Genitourinary: Positive for vaginal bleeding. Negative for decreased urine volume, difficulty urinating, dysuria and flank pain.  Musculoskeletal: Negative for back pain.  Skin: Negative for color change and rash.  Neurological: Negative for dizziness, weakness and numbness.  Hematological: Negative for adenopathy.  All other systems reviewed and are negative.    Physical Exam Updated Vital Signs BP 133/72 (BP Location: Left Arm)   Pulse 76   Temp 98.5 F (36.9 C) (Oral)   Resp 20   Ht  (1.626 m)   Wt 50.3 kg (111 lb)   SpO2 99%   BMI 19.05 kg/m   Physical Exam  Constitutional: She is oriented to person, place, and time. She appears well-nourished.  Pt is tearful  HENT:  Mouth/Throat: Oropharynx is clear and moist.  Cardiovascular:  Normal rate and regular rhythm.  Pulmonary/Chest: Effort normal and breath sounds normal.  Abdominal: Soft. Normal appearance. There is tenderness. There is no rigidity, no rebound, no guarding and no CVA tenderness.  Diffuse ttp of the lower abdomen.  Soft.  No guarding or rebound tenderness.   Musculoskeletal: Normal range of motion.  Neurological: She is alert and oriented to person, place, and time.  Skin: Skin is warm.  Nursing note and vitals reviewed.    ED Treatments / Results  Labs (all labs ordered are listed, but only abnormal results are displayed) Labs Reviewed  BASIC METABOLIC PANEL - Abnormal; Notable for the following components:      Result Value   Potassium 3.3 (*)    All other components within normal limits  HCG, QUANTITATIVE, PREGNANCY - Abnormal; Notable for the following components:   hCG, Beta Chain, Quant, S 430 (*)    All other components within normal limits  CBC WITH DIFFERENTIAL/PLATELET  URINALYSIS, ROUTINE W REFLEX MICROSCOPIC    EKG None  Radiology US Ob Less Than 14 Weeks With Ob Transvaginal  Result Date: 04/06/2018 CLINICAL DATA:  Cramping.  Bleeding. EXAM: OBSTETRIC <14 WK [place] AND TRANSVAGINAL OB US TECHNIQUE: Both transabdominal and transvaginal ultrasound examinations were performed for complete evaluation of the gestation as well as the maternal uterus, adnexal regions, and pelvic cul-de-sac. Transvaginal technique was performed to assess early pregnancy. COMPARISON:  No prior. FINDINGS: Intrauterine gestational sac: Single irregular gestational sac Yolk sac:  None visualized Embryo:  Visualized Cardiac Activity: Not visualized CRL: 20.5 mm   8 w   5 d                  [place] EDC: 11/11/2018 Subchorionic hemorrhage:  None visualized. Maternal uterus/adnexae: Unremarkable. IMPRESSION: Irregular gestational sac with 8 week 5 day fetal pole noted. No cardiac activity visualized. Findings are suspicious but not yet definitive for failed pregnancy.  Recommend follow-up US in 10-14 days for definitive diagnosis. This recommendation follows SRU consensus guidelines: Diagnostic Criteria for Nonviable Pregnancy Early in the First Trimester. Malva Limes Med 2013; 161:0960-45. Electronically Signed   By: Maisie Fus  Register   On: 04/06/2018 10:18    Procedures Procedures (including critical care time)  Medications Ordered in ED Medications  ibuprofen (ADVIL,MOTRIN) tablet 800 mg (800 mg Oral Not Given 04/06/18 0833)  misoprostol (CYTOTEC) tablet 800 mcg (has no administration in time range)     Initial Impression / Assessment and Plan / ED Course  I have reviewed the triage vital signs and the nursing notes.  Pertinent labs & imaging results that were available during my  care of the patient were reviewed by me and considered in my medical decision making (see chart for details).    On further hx, pt admits that she did not take the Cytotec that was prescribed in April.  Stating that she began bleeding again and felt she did not need the medication.     She is well appearing.  Vitals reviewed.  No orthostatic hypotension. Pt talking on cell phone during multiple rechecks. Labs reassuring.  US findings discussed.  Will consult OB/GYN  1030  Consulted Dr. Despina Hidden and discussed findings with him.  Advised to give pt 800 mcg of Cytotec and rx for one refill to repeat in 24 hrs if needed.  He will see pt in the office on Tuesday 5/21 and to advise pt to come to [facility] during the weekend if she develops problems.    Discussed tx plan with pt, she verbalized understanding and agrees to plan.     Final Clinical Impressions(s) / ED Diagnoses   Final diagnoses:  Vaginal bleeding  Pelvic pain  Nonviable pregnancy    ED Discharge Orders    None       Pauline Aus, PA-C 04/08/18 0950    Samuel Jester, DO 04/08/18 2133

## 2018-04-11 ENCOUNTER — Ambulatory Visit: Payer: Medicaid Other | Admitting: Advanced Practice Midwife

## 2018-04-12 ENCOUNTER — Ambulatory Visit: Payer: Medicaid Other | Admitting: Advanced Practice Midwife

## 2018-04-12 ENCOUNTER — Other Ambulatory Visit: Payer: Self-pay | Admitting: Women's Health

## 2018-04-12 DIAGNOSIS — O039 Complete or unspecified spontaneous abortion without complication: Secondary | ICD-10-CM

## 2018-04-17 ENCOUNTER — Encounter: Payer: Self-pay | Admitting: Women's Health

## 2018-04-17 DIAGNOSIS — Z124 Encounter for screening for malignant neoplasm of cervix: Secondary | ICD-10-CM | POA: Insufficient documentation

## 2018-04-18 ENCOUNTER — Other Ambulatory Visit: Payer: Medicaid Other

## 2018-04-18 DIAGNOSIS — O021 Missed abortion: Secondary | ICD-10-CM

## 2018-04-18 NOTE — Progress Notes (Signed)
Patient came into the office with missed ab specimen.  Will send to lab for surgical pathology per [facility].

## 2018-04-19 LAB — BETA HCG QUANT (REF LAB): HCG QUANT: 5 m[IU]/mL

## 2018-05-09 ENCOUNTER — Emergency Department (HOSPITAL_COMMUNITY)
Admission: EM | Admit: 2018-05-09 | Discharge: 2018-05-09 | Disposition: A | Discharge status: Home / Self Care | Payer: Medicaid Other | Attending: Emergency Medicine | Admitting: Emergency Medicine

## 2018-05-09 ENCOUNTER — Other Ambulatory Visit: Payer: Self-pay

## 2018-05-09 ENCOUNTER — Encounter (HOSPITAL_COMMUNITY): Payer: Self-pay | Admitting: Emergency Medicine

## 2018-05-09 DIAGNOSIS — Z79899 Other long term (current) drug therapy: Secondary | ICD-10-CM | POA: Insufficient documentation

## 2018-05-09 DIAGNOSIS — G40909 Epilepsy, unspecified, not intractable, without status epilepticus: Secondary | ICD-10-CM | POA: Diagnosis not present

## 2018-05-09 DIAGNOSIS — R569 Unspecified convulsions: Secondary | ICD-10-CM | POA: Diagnosis present

## 2018-05-09 LAB — BASIC METABOLIC PANEL
Anion gap: 10 (ref 5–15)
BUN: 11 mg/dL (ref 6–20)
CALCIUM: 9.1 mg/dL (ref 8.9–10.3)
CO2: 24 mmol/L (ref 22–32)
Chloride: 105 mmol/L (ref 101–111)
Creatinine, Ser: 0.88 mg/dL (ref 0.44–1.00)
Glucose, Bld: 103 mg/dL — ABNORMAL HIGH (ref 65–99)
Potassium: 3.6 mmol/L (ref 3.5–5.1)
Sodium: 139 mmol/L (ref 135–145)

## 2018-05-09 MED ORDER — LEVETIRACETAM IN NACL 1000 MG/100ML IV SOLN
1000.0000 mg | Freq: Once | INTRAVENOUS | Status: AC
  Administered 2018-05-09: 1000 mg via INTRAVENOUS
  Filled 2018-05-09: qty 100

## 2018-05-09 NOTE — ED Triage Notes (Signed)
Pt brought in by [facility]. Pt grandmother called EMS stating pt had 2 seizures that last 45 seconds a piece this morning.Pt Dx with seizures in February. Pt was on seizure medicine however is made her vomit so she stopped taking them.

## 2018-05-09 NOTE — Discharge Instructions (Addendum)
You were seen today for seizure.  It is very important that you take your seizure medication as directed.  Call neurologist for close follow-up.

## 2018-05-09 NOTE — ED Notes (Signed)
Ambulated around desk. Pt slighty unsteady but able to catch self. Nad.

## 2018-05-09 NOTE — ED Notes (Signed)
Checked with pt for urine sample,pt can't go right now.Pt aware we need urine.

## 2018-05-09 NOTE — ED Provider Notes (Signed)
[facility] Provider Note   CSN: [id number] Arrival date & time: 05/09/18  [id number]     History   Chief Complaint Chief Complaint  Patient presents with  . Seizures    HPI Michelle Short is a 27 y.o. female.  HPI  This is a 27 year old female with a history of seizures who presents with seizure activity.  Per EMS, they were called out as the patient reportedly had 2 less than 1 minute seizures back to back.  Patient's grandmother reported these.  Patient does not have any recollection of the events of tonight.  She does have a history of seizures.  She states that she did not take her Keppra yesterday.  EMS reported that her grandmother stated she did not tolerate Keppra; however, patient reports to me that she has not had any significant vomiting or adverse effects to Keppra previously.  Patient does report significant stressors including a recent miscarriage for which "I am not over."  She denies any vaginal bleeding or ongoing abdominal pain.  She does report that she passed fetal products.  She was last seen May 17 based on chart review and at that time had a missed AB.  She reportedly took Cytotec and followed-up with OB.  She denies any recent illnesses or fevers.  She denies any physical pain at this time.   7:26 AM Patient sister not bedside.  Witnessed 2 seizures at home.  She does not think that the patient is taking her medications as prescribed.  She [place]y in [place].  Next appointment is in October.  Patient is sleeping.   Past Medical History:  Diagnosis Date  . Headache   . Marijuana abuse   . Seizures (HCC) 12/2017    Patient Active Problem List   Diagnosis Date Noted  . Cervical cancer screening 04/17/2018  . Trichomonas infection 02/19/2018  . Seizure (HCC) 01/11/2018  . Missed abortion 01/11/2018  . Marijuana abuse   . Acute lower UTI     History reviewed. No pertinent surgical history.   OB History    Gravida    1   Para      Term      Preterm      AB  1   Living        SAB  1   TAB      Ectopic      Multiple      Live Births               Home Medications    Prior to Admission medications   Medication Sig Start Date End Date Taking? Authorizing Provider  dexamethasone (DECADRON) 2 MG tablet Take 3 tablets the first day, 2 the second and 1 the third day Patient not taking: Reported on 04/06/2018 04/05/18   York SpanielWillis, Charles K, MD  folic acid (FOLVITE) 1 MG tablet Take 1 tablet (1 mg total) by mouth daily. Patient not taking: Reported on 03/22/2018 03/12/18   York SpanielWillis, Charles K, MD  HYDROcodone-acetaminophen (NORCO/VICODIN) 5-325 MG tablet Take one tab po q 4 hrs prn pain 04/06/18   Triplett, Tammy, PA-C  levETIRAcetam (KEPPRA) 500 MG tablet Take 1 tablet (500 mg total) by mouth 2 (two) times daily. 03/08/18   Gilda CreasePollina, Christopher J, MD  misoprostol (CYTOTEC) 200 MCG tablet Take 4 tablets (800 mcg total) by mouth once for 1 dose. In 24 hrs after your initial dose if needed 04/06/18 04/06/18  Pauline Ausriplett, Tammy, PA-C  Family History Family History  Problem Relation Age of Onset  . Hyperlipidemia Mother   . Diabetes Paternal Grandfather   . Heart attack Maternal Grandfather     Social History Social History   Tobacco Use  . Smoking status: Never Smoker  . Smokeless tobacco: Never Used  Substance Use Topics  . Alcohol use: No  . Drug use: Yes    Types: Marijuana    Comment: sometimes      Allergies   Mosquito (diagnostic)   Review of Systems Review of Systems  Constitutional: Negative for fever.  Respiratory: Negative for shortness of breath.   Cardiovascular: Negative for chest pain.  Gastrointestinal: Negative for abdominal pain.  Genitourinary: Negative for pelvic pain and vaginal bleeding.  Musculoskeletal: Negative for neck pain.  Neurological: Positive for seizures. Negative for headaches.  All other systems reviewed and are negative.    Physical  Exam Updated Vital Signs BP 116/82   Pulse 74   Temp 98.3 F (36.8 C) (Oral)   Resp 16   Ht 5\' 4"  (1.626 m)   Wt 50.3 kg (111 lb)   SpO2 95%   BMI 19.05 kg/m   Physical Exam  Constitutional: She is oriented to person, place, and time.  Withdrawn appearing, flat, no acute distress  HENT:  Head: Normocephalic and atraumatic.  Eyes: Pupils are equal, round, and reactive to light.  Neck: Normal range of motion. Neck supple.  Cardiovascular: Normal rate, regular rhythm and normal heart sounds.  Pulmonary/Chest: Effort normal and breath sounds normal. No respiratory distress. She has no wheezes.  Abdominal: Soft. There is no tenderness.  Neurological: She is alert and oriented to person, place, and time.  Cranial nerves II through XII intact, 5 out of 5 strength in all 4 extremities, no dysmetria to finger-nose-finger  Skin: Skin is warm and dry.  Psychiatric: She has a normal mood and affect.  Nursing note and vitals reviewed.    ED Treatments / Results  Labs (all labs ordered are listed, but only abnormal results are displayed) Labs Reviewed  BASIC METABOLIC PANEL - Abnormal; Notable for the following components:      Result Value   Glucose, Bld 103 (*)    All other components within normal limits    EKG EKG Interpretation  Date/Time:  Wednesday May 09 2018 06:27:09 EDT Ventricular Rate:  88 PR Interval:    QRS Duration: 101 QT Interval:  364 QTC Calculation: 441 R Axis:   59 Text Interpretation:  Sinus rhythm RSR' in V1 or V2, right VCD or RVH Borderline T abnormalities, anterior leads Confirmed by Ross Marcus ([id number]) on 05/09/2018 6:32:14 AM   Radiology No results found.  Procedures Procedures (including critical care time)  Medications Ordered in ED Medications  levETIRAcetam (KEPPRA) IVPB 1000 mg/100 mL premix (0 mg Intravenous Stopped 05/09/18 0708)     Initial Impression / Assessment and Plan / ED Course  I have reviewed the triage vital signs  and the nursing notes.  Pertinent labs & imaging results that were available during my care of the patient were reviewed by me and considered in my medical decision making (see chart for details).     Patient presents with reported seizure activity.  She is nontoxic and nonfocal on exam.  Vital signs are reassuring.  Reports stressors at home and sadness related to recent miscarriage.  She states that she did not take her medication yesterday.  Per family, I question her compliance.  Patient was loaded with IV  Keppra.  Basic metabolic panel without metabolic derangement.  Normal glucose.  Per report, patient had passage of products of conception and has followed up with OB since her May 17 visit to the ED.  Will not obtain a urine pregnancy at this time as she is not having any ongoing abdominal pain or vaginal bleeding.  Stressed the importance of neurology follow-up and compliance with medication with the patient and her sister.  After history, exam, and medical workup I feel the patient has been appropriately medically screened and is safe for discharge home. Pertinent diagnoses were discussed with the patient. Patient was given return precautions.  Final Clinical Impressions(s) / ED Diagnoses   Final diagnoses:  Seizure [facility])    ED Discharge Orders    None       Wilkie Aye, Mayer Masker, MD 05/09/18 [phone]

## 2018-07-04 ENCOUNTER — Telehealth: Payer: Self-pay | Admitting: Obstetrics & Gynecology

## 2018-07-04 NOTE — Telephone Encounter (Signed)
Pt calling requests pathology results from placenta  CB: [phone]

## 2018-07-04 NOTE — Telephone Encounter (Signed)
Patient informed pathology showed 8 wekk gestation.  Asked if it showed reason for miscarriage and I informed patient it did not. Verbalized understanding.

## 2018-08-07 DIAGNOSIS — Z0289 Encounter for other administrative examinations: Secondary | ICD-10-CM

## 2018-08-13 DIAGNOSIS — Z029 Encounter for administrative examinations, unspecified: Secondary | ICD-10-CM

## 2018-08-29 ENCOUNTER — Encounter (HOSPITAL_COMMUNITY): Payer: Self-pay | Admitting: Emergency Medicine

## 2018-08-29 ENCOUNTER — Emergency Department (HOSPITAL_COMMUNITY)
Admission: EM | Admit: 2018-08-29 | Discharge: 2018-08-30 | Disposition: A | Discharge status: Home / Self Care | Payer: Medicaid Other | Attending: Emergency Medicine | Admitting: Emergency Medicine

## 2018-08-29 ENCOUNTER — Other Ambulatory Visit: Payer: Self-pay

## 2018-08-29 DIAGNOSIS — J0110 Acute frontal sinusitis, unspecified: Secondary | ICD-10-CM | POA: Insufficient documentation

## 2018-08-29 DIAGNOSIS — R51 Headache: Secondary | ICD-10-CM | POA: Diagnosis not present

## 2018-08-29 DIAGNOSIS — Z79899 Other long term (current) drug therapy: Secondary | ICD-10-CM | POA: Insufficient documentation

## 2018-08-29 DIAGNOSIS — R519 Headache, unspecified: Secondary | ICD-10-CM

## 2018-08-29 LAB — CBC WITH DIFFERENTIAL/PLATELET
Abs Immature Granulocytes: 0.02 10*3/uL (ref 0.00–0.07)
BASOS ABS: 0 10*3/uL (ref 0.0–0.1)
BASOS PCT: 0 %
Eosinophils Absolute: 0.2 10*3/uL (ref 0.0–0.5)
Eosinophils Relative: 3 %
HCT: 35.7 % — ABNORMAL LOW (ref 36.0–46.0)
Hemoglobin: 11.1 g/dL — ABNORMAL LOW (ref 12.0–15.0)
IMMATURE GRANULOCYTES: 0 %
LYMPHS ABS: 0.9 10*3/uL (ref 0.7–4.0)
Lymphocytes Relative: 11 %
MCH: 28.5 pg (ref 26.0–34.0)
MCHC: 31.1 g/dL (ref 30.0–36.0)
MCV: 91.5 fL (ref 80.0–100.0)
Monocytes Absolute: 0.6 10*3/uL (ref 0.1–1.0)
Monocytes Relative: 7 %
NEUTROS PCT: 79 %
NRBC: 0 % (ref 0.0–0.2)
Neutro Abs: 6.5 10*3/uL (ref 1.7–7.7)
PLATELETS: 218 10*3/uL (ref 150–400)
RBC: 3.9 MIL/uL (ref 3.87–5.11)
RDW: 12.3 % (ref 11.5–15.5)
WBC: 8.2 10*3/uL (ref 4.0–10.5)

## 2018-08-29 LAB — I-STAT BETA HCG BLOOD, ED (MC, WL, AP ONLY): I-stat hCG, quantitative: <5 m[IU]/mL (ref <5)

## 2018-08-29 MED ORDER — PROCHLORPERAZINE EDISYLATE 10 MG/2ML IJ SOLN
10.0000 mg | Freq: Once | INTRAMUSCULAR | Status: AC
  Administered 2018-08-29: 10 mg via INTRAVENOUS
  Filled 2018-08-29: qty 2

## 2018-08-29 MED ORDER — DEXAMETHASONE SODIUM PHOSPHATE 10 MG/ML IJ SOLN
10.0000 mg | Freq: Once | INTRAMUSCULAR | Status: AC
  Administered 2018-08-29: 10 mg via INTRAVENOUS
  Filled 2018-08-29: qty 1

## 2018-08-29 MED ORDER — DIPHENHYDRAMINE HCL 50 MG/ML IJ SOLN
12.5000 mg | Freq: Once | INTRAMUSCULAR | Status: AC
  Administered 2018-08-29: 12.5 mg via INTRAVENOUS
  Filled 2018-08-29: qty 1

## 2018-08-29 MED ORDER — SODIUM CHLORIDE 0.9 % IV BOLUS
1000.0000 mL | Freq: Once | INTRAVENOUS | Status: AC
  Administered 2018-08-29: 1000 mL via INTRAVENOUS

## 2018-08-29 NOTE — ED Triage Notes (Signed)
Pt c/o nasal congestion, cough, sneezing and fever x 3 days, pt does not know how high fever has been, has taken OTC cold and cough meds with no relief

## 2018-08-30 LAB — BASIC METABOLIC PANEL
Anion gap: 5 (ref 5–15)
BUN: 8 mg/dL (ref 6–20)
CALCIUM: 9 mg/dL (ref 8.9–10.3)
CO2: 30 mmol/L (ref 22–32)
Chloride: 103 mmol/L (ref 98–111)
Creatinine, Ser: 0.8 mg/dL (ref 0.44–1.00)
GFR calc Af Amer: >60 mL/min (ref >60)
Glucose, Bld: 92 mg/dL (ref 70–99)
Potassium: 3.6 mmol/L (ref 3.5–5.1)
Sodium: 138 mmol/L (ref 135–145)

## 2018-08-30 MED ORDER — CETIRIZINE-PSEUDOEPHEDRINE ER 5-120 MG PO TB12: 1.0000 | ORAL_TABLET | Freq: Two times a day (BID) | ORAL | 0 refills | Status: DC

## 2018-08-30 MED ORDER — KETOROLAC TROMETHAMINE 30 MG/ML IJ SOLN
15.0000 mg | Freq: Once | INTRAMUSCULAR | Status: AC
  Administered 2018-08-30: 15 mg via INTRAVENOUS
  Filled 2018-08-30: qty 1

## 2018-08-30 MED ORDER — AMOXICILLIN-POT CLAVULANATE 500-125 MG PO TABS: 1.0000 | ORAL_TABLET | Freq: Three times a day (TID) | ORAL | 0 refills | Status: DC

## 2018-08-30 NOTE — Discharge Instructions (Addendum)
Take the entire course of the antibiotics as prescribed.  Also take the Zyrtec-D which will help you with the sinus pressure and congestion.  Warm compresses applied to your face and forehead can also help relieve pain and pressure.  Get rechecked for any worsening symptoms.  Remember to take your Keppra as soon as you get home.

## 2018-08-30 NOTE — ED Provider Notes (Signed)
[facility] EMERGENCY DEPARTMENT Provider Note   CSN: [id number] Arrival date & time: 08/29/18  2116     History   Chief Complaint Chief Complaint  Patient presents with  . Headache    HPI Michelle Short is a 27 y.o. female with a history of new onset seizures since February, under the care of Dr. Anne Hahn with frequent headaches since then. She endorses a similar headache over the past 3 days but also accompanied by nasal congestion, sinus pressure, thick purulent nasal discharge along with nonproductive cough, and subjective fever.    The patient has bilateral forehead pain.  There has been no syncope, confusion, sob, neck stiffness although endorses her neck has been achy and denies localized weakness.  She does have generalized fatigue.  The patient has tried OTC cold and cough medication without relief of symptoms. She has a seizure approximately 1x per month, none in recent weeks.   The history is provided by the patient.    Past Medical History:  Diagnosis Date  . Headache   . Marijuana abuse   . Seizures (HCC) 12/2017    Patient Active Problem List   Diagnosis Date Noted  . Cervical cancer screening 04/17/2018  . Trichomonas infection 02/19/2018  . Seizure (HCC) 01/11/2018  . Missed abortion 01/11/2018  . Marijuana abuse   . Acute lower UTI     History reviewed. No pertinent surgical history.   OB History    Gravida  1   Para      Term      Preterm      AB  1   Living        SAB  1   TAB      Ectopic      Multiple      Live Births               Home Medications    Prior to Admission medications   Medication Sig Start Date End Date Taking? Authorizing Provider  amoxicillin-clavulanate (AUGMENTIN) 500-125 MG tablet Take 1 tablet (500 mg total) by mouth every 8 (eight) hours. 08/30/18   Burgess Amor, PA-C  cetirizine-pseudoephedrine (ZYRTEC-D) 5-120 MG tablet Take 1 tablet by mouth 2 (two) times daily. 08/30/18   Burgess Amor, PA-C    dexamethasone (DECADRON) 2 MG tablet Take 3 tablets the first day, 2 the second and 1 the third day Patient not taking: Reported on 04/06/2018 04/05/18   York Spaniel, MD  folic acid (FOLVITE) 1 MG tablet Take 1 tablet (1 mg total) by mouth daily. Patient not taking: Reported on 03/22/2018 03/12/18   York Spaniel, MD  HYDROcodone-acetaminophen (NORCO/VICODIN) 5-325 MG tablet Take one tab po q 4 hrs prn pain 04/06/18   Triplett, Tammy, PA-C  levETIRAcetam (KEPPRA) 500 MG tablet Take 1 tablet (500 mg total) by mouth 2 (two) times daily. 03/08/18   Gilda Crease, MD  misoprostol (CYTOTEC) 200 MCG tablet Take 4 tablets (800 mcg total) by mouth once for 1 dose. In 24 hrs after your initial dose if needed 04/06/18 04/06/18  Pauline Aus, PA-C    Family History Family History  Problem Relation Age of Onset  . Hyperlipidemia Mother   . Diabetes Paternal Grandfather   . Heart attack Maternal Grandfather     Social History Social History   Tobacco Use  . Smoking status: Never Smoker  . Smokeless tobacco: Never Used  Substance Use Topics  . Alcohol use: No  . Drug use:  Yes    Types: Marijuana    Comment: sometimes      Allergies   Ibuprofen and Mosquito (diagnostic)   Review of Systems Review of Systems  Constitutional: Positive for fatigue and fever.  HENT: Positive for congestion, sinus pressure and sinus pain. Negative for nosebleeds and sore throat.   Eyes: Negative for photophobia.  Respiratory: Positive for cough. Negative for chest tightness and shortness of breath.   Cardiovascular: Negative for chest pain.  Gastrointestinal: Negative for abdominal pain, nausea and vomiting.  Genitourinary: Negative.   Musculoskeletal: Positive for neck pain. Negative for arthralgias, joint swelling and neck stiffness.  Skin: Negative.  Negative for rash and wound.  Neurological: Positive for headaches. Negative for dizziness, seizures, syncope, weakness, light-headedness and  numbness.  Psychiatric/Behavioral: Negative.      Physical Exam Updated Vital Signs BP 118/69 (BP Location: Right Arm)   Pulse 70   Temp 98.8 F (37.1 C) (Oral)   Resp 16   Ht 5\' 4"  (1.626 m)   Wt 49 kg   LMP 08/26/2018   SpO2 98%   BMI 18.54 kg/m   Physical Exam  Constitutional: She is oriented to person, place, and time. She appears well-developed and well-nourished.  Uncomfortable appearing  HENT:  Head: Normocephalic and atraumatic.  Right Ear: Tympanic membrane normal.  Left Ear: Tympanic membrane normal.  Nose: Mucosal edema present. Right sinus exhibits frontal sinus tenderness. Left sinus exhibits frontal sinus tenderness.  Mouth/Throat: Oropharynx is clear and moist.  Eyes: Pupils are equal, round, and reactive to light. EOM are normal.  Neck: Normal range of motion. Neck supple. No neck rigidity.  Soreness to palpation of paracervical musculature.  Cardiovascular: Normal rate and normal heart sounds.  Pulmonary/Chest: Effort normal.  Abdominal: Soft. There is no tenderness.  Musculoskeletal: Normal range of motion.  Lymphadenopathy:    She has no cervical adenopathy.  Neurological: She is alert and oriented to person, place, and time. She has normal strength. No sensory deficit. Gait normal. GCS eye subscore is 4. GCS verbal subscore is 5. GCS motor subscore is 6.  Normal heel-shin, normal rapid alternating movements. Cranial nerves III-XII intact.  No pronator drift.  Skin: Skin is warm and dry. No rash noted.  Psychiatric: She has a normal mood and affect. Her speech is normal and behavior is normal. Thought content normal. Cognition and memory are normal.  Nursing note and vitals reviewed.    ED Treatments / Results  Labs (all labs ordered are listed, but only abnormal results are displayed) Labs Reviewed  CBC WITH DIFFERENTIAL/PLATELET - Abnormal; Notable for the following components:      Result Value   Hemoglobin 11.1 (*)    HCT 35.7 (*)    All  other components within normal limits  BASIC METABOLIC PANEL  I-STAT BETA HCG BLOOD, ED ([facility], [facility], [facility] ONLY)    EKG None  Radiology No results found.  Procedures Procedures (including critical care time)  Medications Ordered in ED Medications  sodium chloride 0.9 % bolus 1,000 mL (0 mLs Intravenous Stopped 08/30/18 0040)  prochlorperazine (COMPAZINE) injection 10 mg (10 mg Intravenous Given 08/29/18 2339)  diphenhydrAMINE (BENADRYL) injection 12.5 mg (12.5 mg Intravenous Given 08/29/18 2338)  dexamethasone (DECADRON) injection 10 mg (10 mg Intravenous Given 08/29/18 2337)  ketorolac (TORADOL) 30 MG/ML injection 15 mg (15 mg Intravenous Given 08/30/18 0040)     Initial Impression / Assessment and Plan / ED Course  I have reviewed the triage vital signs and the nursing  notes.  Pertinent labs & imaging results that were available during my care of the patient were reviewed by me and considered in my medical decision making (see chart for details).     Pt was given IV fluids along with migraine cocktail with near resolution of headache.  Sx suggesting acute sinusitis so she was started on abx, adding zyrtec for decongestant.  Discussed home care and return precautions. Neuro findings normal, intracranial etiology unlikely.  Final Clinical Impressions(s) / ED Diagnoses   Final diagnoses:  Acute nonintractable headache, unspecified headache type  Acute non-recurrent frontal sinusitis    ED Discharge Orders         Ordered    amoxicillin-clavulanate (AUGMENTIN) 500-125 MG tablet  Every 8 hours     08/30/18 0105    cetirizine-pseudoephedrine (ZYRTEC-D) 5-120 MG tablet  2 times daily     08/30/18 0105           Burgess Amor, PA-C 08/30/18 1628    Samuel Jester, DO 08/30/18 2232

## 2018-09-19 ENCOUNTER — Encounter: Payer: Self-pay | Admitting: Neurology

## 2018-09-19 ENCOUNTER — Other Ambulatory Visit: Payer: Self-pay

## 2018-09-19 ENCOUNTER — Ambulatory Visit: Payer: Medicaid Other | Admitting: Neurology

## 2018-09-19 VITALS — BP 123/71 | HR 80 | Resp 14 | Ht 64.0 in | Wt 103.0 lb

## 2018-09-19 DIAGNOSIS — G43019 Migraine without aura, intractable, without status migrainosus: Secondary | ICD-10-CM | POA: Diagnosis not present

## 2018-09-19 DIAGNOSIS — R569 Unspecified convulsions: Secondary | ICD-10-CM

## 2018-09-19 HISTORY — DX: Migraine without aura, intractable, without status migrainosus: G43.019

## 2018-09-19 MED ORDER — ZONISAMIDE 50 MG PO CAPS: ORAL_CAPSULE | ORAL | 5 refills | Status: DC

## 2018-09-19 MED ORDER — RIZATRIPTAN BENZOATE 10 MG PO TABS: 10.0000 mg | ORAL_TABLET | Freq: Three times a day (TID) | ORAL | 3 refills | Status: DC | PRN

## 2018-09-19 MED ORDER — FOLIC ACID 1 MG PO TABS: 1.0000 mg | ORAL_TABLET | Freq: Every day | ORAL | 3 refills | Status: DC

## 2018-09-19 NOTE — Progress Notes (Signed)
Reason for visit: Seizures, migraine  Michelle Short is an 27 y.o. female  History of present illness:  Michelle Short is a 27 year old right-handed black female with a history of migraine headaches and a history of seizures.  The patient was in the emergency room on 09 May 2018 with a seizure event, she went to the emergency room on 29 August 2018 with a migraine headache.  The patient has been on Keppra, she has been somewhat noncompliant with the medication, she will pick up at 1 month's prescription every 2 or 3 months.  The patient has not been feeling good on the medication, the Keppra is making her irritable, she is not sleeping well at night.  The patient feels that she is having some increased problems with depression.  The patient is smoking marijuana regularly, virtually daily.  She reports that she is having some problems with memory and concentration, and difficulty focusing.  The patient claims that she is not operating a motor vehicle.  She returns to this office for an evaluation.  The patient is having migraine headaches every 2 to 3 days, she has headaches mainly in the left frontotemporal area associated with photophobia and phonophobia, odors may also bother her.  Tylenol may help the headache if she takes it early enough.  She sometimes may have nausea and vomiting.  Past Medical History:  Diagnosis Date  . Headache   . Marijuana abuse   . Seizures (HCC) 12/2017    History reviewed. No pertinent surgical history.  Family History  Problem Relation Age of Onset  . Hyperlipidemia Mother   . Diabetes Paternal Grandfather   . Heart attack Maternal Grandfather     Social history:  reports that she has never smoked. She has never used smokeless tobacco. She reports that she has current or past drug history. Drug: Marijuana. She reports that she does not drink alcohol.    Allergies  Allergen Reactions  . Ibuprofen Other (See Comments)    "interferes with my seizure  medication"  . Mosquito (Diagnostic)     Medications:  Prior to Admission medications   Medication Sig Start Date End Date Taking? Authorizing Provider  levETIRAcetam (KEPPRA) 500 MG tablet Take 1 tablet (500 mg total) by mouth 2 (two) times daily. 03/08/18  Yes Pollina, Canary Brim, MD    ROS:  Out of a complete 14 system review of symptoms, the patient complains only of the following symptoms, and all other reviewed systems are negative.  Decreased activity, decreased appetite, fatigue Ear pain, ringing in the ears Light sensitivity, double vision, blurred vision Chest tightness Leg swelling Insomnia, frequent waking, daytime sleepiness, acting out dreams Joint pain, back pain, aching muscles, walking difficulty Skin rash Swollen lymph nodes Memory loss, dizziness, headache, seizures, weakness, tremors Agitation, behavior problem, confusion, decreased concentration, depression, anxiety, hallucinations, hyperactivity  Blood pressure 123/71, pulse 80, resp. rate 14, height 5\' 4"  (1.626 m), weight 103 lb (46.7 kg), last menstrual period 08/26/2018, unknown if currently breastfeeding.  Physical Exam  General: The patient is alert and cooperative at the time of the examination.  Skin: No significant peripheral edema is noted.   Neurologic Exam  Mental status: The patient is alert and oriented x 3 at the time of the examination. The patient has apparent normal recent and remote memory, with an apparently normal attention span and concentration ability.   Cranial nerves: Facial symmetry is present. Speech is normal, no aphasia or dysarthria is noted. Extraocular movements are  full. Visual fields are full.  Motor: The patient has good strength in all 4 extremities.  Sensory examination: Soft touch sensation is symmetric on the face, arms, and legs.  Coordination: The patient has good finger-nose-finger and heel-to-shin bilaterally.  Gait and station: The patient has a  normal gait. Tandem gait is normal. Romberg is negative. No drift is seen.  Reflexes: Deep tendon reflexes are symmetric.   Assessment/Plan:  1.  Seizure disorder  2.  Medical noncompliance  3.  Migraine headache  3.  Reported memory disturbance  The patient will be tapered off of the Keppra as she is not tolerating this well, she will go to 250 mg twice daily for 3 weeks and then stop.  She will start Zonegran 50 mg twice daily for 2 weeks and then go to 100 mg twice daily.  The patient will contact me for any dose adjustments of the medication.  A Maxalt prescription was given for the headache.  She will follow-up in 6 months.  She is not to operate a motor vehicle.  She was given a prescription for folic acid.  I indicated to the patient that her memory disturbance likely is related to her frequent marijuana use, I have recommended stopping the use of this drug as it is impairing her memory and likely is worsening seizure control.  Marlan Palau MD 09/19/2018 12:22 PM  [facility] [address] [place], [place] [address]  Phone [phone] Fax [phone]

## 2018-09-19 NOTE — Patient Instructions (Signed)
With the Keppra 500 mg tablet, take 1/2 tablet twice a day for 3 weeks, then stop the medication.  We will start Zonegran for the seizures and the headache.

## 2018-10-08 ENCOUNTER — Ambulatory Visit (INDEPENDENT_AMBULATORY_CARE_PROVIDER_SITE_OTHER): Payer: Medicaid Other | Admitting: Women's Health

## 2018-10-08 ENCOUNTER — Encounter: Payer: Self-pay | Admitting: Women's Health

## 2018-10-08 VITALS — BP 110/74 | HR 92 | Ht 64.0 in | Wt 101.0 lb

## 2018-10-08 DIAGNOSIS — N760 Acute vaginitis: Secondary | ICD-10-CM | POA: Diagnosis not present

## 2018-10-08 DIAGNOSIS — O26851 Spotting complicating pregnancy, first trimester: Secondary | ICD-10-CM

## 2018-10-08 DIAGNOSIS — R110 Nausea: Secondary | ICD-10-CM | POA: Diagnosis not present

## 2018-10-08 DIAGNOSIS — B9689 Other specified bacterial agents as the cause of diseases classified elsewhere: Secondary | ICD-10-CM | POA: Diagnosis not present

## 2018-10-08 DIAGNOSIS — Z3201 Encounter for pregnancy test, result positive: Secondary | ICD-10-CM | POA: Diagnosis not present

## 2018-10-08 DIAGNOSIS — Z3491 Encounter for supervision of normal pregnancy, unspecified, first trimester: Secondary | ICD-10-CM

## 2018-10-08 LAB — POCT WET PREP (WET MOUNT)
Clue Cells Wet Prep Whiff POC: POSITIVE
Trichomonas Wet Prep HPF POC: ABSENT

## 2018-10-08 LAB — POCT URINE PREGNANCY: Preg Test, Ur: POSITIVE — AB

## 2018-10-08 MED ORDER — METRONIDAZOLE 500 MG PO TABS: 500.0000 mg | ORAL_TABLET | Freq: Two times a day (BID) | ORAL | 0 refills | Status: DC

## 2018-10-08 NOTE — Patient Instructions (Signed)
Michelle SheldonImani B Short, I greatly value your feedback.  If you receive a survey following your visit with us today, we appreciate you taking the time to fill it out.  Thanks, Joellyn HaffKim , CNM, WHNP-BC   Nausea & Vomiting  Have saltine crackers or pretzels by your bed and eat a few bites before you raise your head out of bed in the morning  Eat small frequent meals throughout the day instead of large meals  Drink plenty of fluids throughout the day to stay hydrated, just don't drink a lot of fluids with your meals.  This can make your stomach fill up faster making you feel sick  Do not brush your teeth right after you eat  Products with real ginger are good for nausea, like ginger ale and ginger hard candy Make sure it says made with real ginger!  Sucking on sour candy like lemon heads is also good for nausea  If your prenatal vitamins make you nauseated, take them at night so you will sleep through the nausea  Sea Bands  If you feel like you need medicine for the nausea & vomiting please let us know  If you are unable to keep any fluids or food down please let us know   Constipation  Drink plenty of fluid, preferably water, throughout the day  Eat foods high in fiber such as fruits, vegetables, and grains  Exercise, such as walking, is a good way to keep your bowels regular  Drink warm fluids, especially warm prune juice, or decaf coffee  Eat a 1/2 cup of real oatmeal (not instant), 1/2 cup applesauce, and 1/2-1 cup warm prune juice every day  If needed, you may take Colace (docusate sodium) stool softener once or twice a day to help keep the stool soft. If you are pregnant, wait until you are out of your first trimester (12-14 weeks of pregnancy)  If you still are having problems with constipation, you may take Miralax once daily as needed to help keep your bowels regular.  If you are pregnant, wait until you are out of your first trimester (12-14 weeks of pregnancy)   First  Trimester of Pregnancy The first trimester of pregnancy is from week 1 until the end of week 12 (months 1 through 3). A week after a sperm fertilizes an egg, the egg will implant on the wall of the uterus. This embryo will begin to develop into a baby. Genes from you and your partner are forming the baby. The female genes determine whether the baby is a boy or a girl. At 6-8 weeks, the eyes and face are formed, and the heartbeat can be seen on ultrasound. At the end of 12 weeks, all the baby's organs are formed.  Now that you are pregnant, you will want to do everything you can to have a healthy baby. Two of the most important things are to get good prenatal care and to follow your health care provider's instructions. Prenatal care is all the medical care you receive before the baby's birth. This care will help prevent, find, and treat any problems during the pregnancy and childbirth. BODY CHANGES Your body goes through many changes during pregnancy. The changes vary from woman to woman.   You may gain or lose a couple of pounds at first.  You may feel sick to your stomach (nauseous) and throw up (vomit). If the vomiting is uncontrollable, call your health care provider.  You may tire easily.  You may develop headaches  that can be relieved by medicines approved by your health care provider.  You may urinate more often. Painful urination may mean you have a bladder infection.  You may develop heartburn as a result of your pregnancy.  You may develop constipation because certain hormones are causing the muscles that push waste through your intestines to slow down.  You may develop hemorrhoids or swollen, bulging veins (varicose veins).  Your breasts may begin to grow larger and become tender. Your nipples may stick out more, and the tissue that surrounds them (areola) may become darker.  Your gums may bleed and may be sensitive to brushing and flossing.  Dark spots or blotches (chloasma, mask  of pregnancy) may develop on your face. This will likely fade after the baby is born.  Your menstrual periods will stop.  You may have a loss of appetite.  You may develop cravings for certain kinds of food.  You may have changes in your emotions from day to day, such as being excited to be pregnant or being concerned that something may go wrong with the pregnancy and baby.  You may have more vivid and strange dreams.  You may have changes in your hair. These can include thickening of your hair, rapid growth, and changes in texture. Some women also have hair loss during or after pregnancy, or hair that feels dry or thin. Your hair will most likely return to normal after your baby is born. WHAT TO EXPECT AT YOUR PRENATAL VISITS During a routine prenatal visit:  You will be weighed to make sure you and the baby are growing normally.  Your blood pressure will be taken.  Your abdomen will be measured to track your baby's growth.  The fetal heartbeat will be listened to starting around week 10 or 12 of your pregnancy.  Test results from any previous visits will be discussed. Your health care provider may ask you:  How you are feeling.  If you are feeling the baby move.  If you have had any abnormal symptoms, such as leaking fluid, bleeding, severe headaches, or abdominal cramping.  If you have any questions. Other tests that may be performed during your first trimester include:  Blood tests to find your blood type and to check for the presence of any previous infections. They will also be used to check for low iron levels (anemia) and Rh antibodies. Later in the pregnancy, blood tests for diabetes will be done along with other tests if problems develop.  Urine tests to check for infections, diabetes, or protein in the urine.  An ultrasound to confirm the proper growth and development of the baby.  An amniocentesis to check for possible genetic problems.  Fetal screens for spina  bifida and Down syndrome.  You may need other tests to make sure you and the baby are doing well. HOME CARE INSTRUCTIONS  Medicines  Follow your health care provider's instructions regarding medicine use. Specific medicines may be either safe or unsafe to take during pregnancy.  Take your prenatal vitamins as directed.  If you develop constipation, try taking a stool softener if your health care provider approves. Diet  Eat regular, well-balanced meals. Choose a variety of foods, such as meat or vegetable-based protein, fish, milk and low-fat dairy products, vegetables, fruits, and whole grain breads and cereals. Your health care provider will help you determine the amount of weight gain that is right for you.  Avoid raw meat and uncooked cheese. These carry germs that can  cause birth defects in the baby.  Eating four or five small meals rather than three large meals a day may help relieve nausea and vomiting. If you start to feel nauseous, eating a few soda crackers can be helpful. Drinking liquids between meals instead of during meals also seems to help nausea and vomiting.  If you develop constipation, eat more high-fiber foods, such as fresh vegetables or fruit and whole grains. Drink enough fluids to keep your urine clear or pale yellow. Activity and Exercise  Exercise only as directed by your health care provider. Exercising will help you:  Control your weight.  Stay in shape.  Be prepared for labor and delivery.  Experiencing pain or cramping in the lower abdomen or low back is a good sign that you should stop exercising. Check with your health care provider before continuing normal exercises.  Try to avoid standing for long periods of time. Move your legs often if you must stand in one place for a long time.  Avoid heavy lifting.  Wear low-heeled shoes, and practice good posture.  You may continue to have sex unless your health care provider directs you  otherwise. Relief of Pain or Discomfort  Wear a good support bra for breast tenderness.   Take warm sitz baths to soothe any pain or discomfort caused by hemorrhoids. Use hemorrhoid cream if your health care provider approves.   Rest with your legs elevated if you have leg cramps or low back pain.  If you develop varicose veins in your legs, wear support hose. Elevate your feet for 15 minutes, 3-4 times a day. Limit salt in your diet. Prenatal Care  Schedule your prenatal visits by the twelfth week of pregnancy. They are usually scheduled monthly at first, then more often in the last 2 months before delivery.  Write down your questions. Take them to your prenatal visits.  Keep all your prenatal visits as directed by your health care provider. Safety  Wear your seat belt at all times when driving.  Make a list of emergency phone numbers, including numbers for family, friends, the hospital, and police and fire departments. General Tips  Ask your health care provider for a referral to a local prenatal education class. Begin classes no later than at the beginning of month 6 of your pregnancy.  Ask for help if you have counseling or nutritional needs during pregnancy. Your health care provider can offer advice or refer you to specialists for help with various needs.  Do not use hot tubs, steam rooms, or saunas.  Do not douche or use tampons or scented sanitary pads.  Do not cross your legs for long periods of time.  Avoid cat litter boxes and soil used by cats. These carry germs that can cause birth defects in the baby and possibly loss of the fetus by miscarriage or stillbirth.  Avoid all smoking, herbs, alcohol, and medicines not prescribed by your health care provider. Chemicals in these affect the formation and growth of the baby.  Schedule a dentist appointment. At home, brush your teeth with a soft toothbrush and be gentle when you floss. SEEK MEDICAL CARE IF:   You have  dizziness.  You have mild pelvic cramps, pelvic pressure, or nagging pain in the abdominal area.  You have persistent nausea, vomiting, or diarrhea.  You have a bad smelling vaginal discharge.  You have pain with urination.  You notice increased swelling in your face, hands, legs, or ankles. SEEK IMMEDIATE MEDICAL CARE IF:  You have a fever.  You are leaking fluid from your vagina.  You have spotting or bleeding from your vagina.  You have severe abdominal cramping or pain.  You have rapid weight gain or loss.  You vomit blood or material that looks like coffee grounds.  You are exposed to [place] measles and have never had them.  You are exposed to fifth disease or chickenpox.  You develop a severe headache.  You have shortness of breath.  You have any kind of trauma, such as from a fall or a car accident. Document Released: 11/01/2001 Document Revised: 03/24/2014 Document Reviewed: 09/17/2013 [facility] Patient Information 2015 [place], [place]. This information is not intended to replace advice given to you by your health care provider. Make sure you discuss any questions you have with your health care provider.

## 2018-10-08 NOTE — Progress Notes (Signed)
GYN VISIT Patient name: Michelle Short MRN [id number]  Date of birth: 12-Aug-1991 Chief Complaint:   Vaginal Bleeding (+home pregnancy test)  History of Present Illness:   Michelle Short is a 27 y.o. G16P0010 African American female at [redacted]w[redacted]d by LMP of 10/6, being seen today for +HPT on 11/4, and spotting that began yesterday.  Certain period ended 10/11, thinks first day was 10/6. No pain/cramping. Spotting last night and this am, pink, only when wiping. Vomited yesterday, mild nausea today. Taking pnv. Reports vaginal discharge x few weeks. No odor/itching.   Patient's last menstrual period was 08/26/2018. Review of Systems:   Pertinent items are noted in HPI Denies fever/chills, dizziness, headaches, visual disturbances, fatigue, shortness of breath, chest pain, abdominal pain, vomiting, abnormal vaginal discharge/itching/odor/irritation, problems with periods, bowel movements, urination, or intercourse unless otherwise stated above.  Pertinent History Reviewed:  Reviewed past medical,surgical, social, obstetrical and family history.  Reviewed problem list, medications and allergies. Physical Assessment:   Vitals:   10/08/18 1435  BP: 110/74  Pulse: 92  Weight: 101 lb (45.8 kg)  Height: 5\' 4"  (1.626 m)  Body mass index is 17.34 kg/m.       Physical Examination:   General appearance: alert, well appearing, and in no distress  Mental status: alert, oriented to person, place, and time  Skin: warm & dry   Cardiovascular: normal heart rate noted  Respiratory: normal respiratory effort, no distress  Abdomen: soft, non-tender   Pelvic: VULVA: normal appearing vulva with no masses, tenderness or lesions, VAGINA: normal appearing vagina with normal color and small amt pink malodorous discharge, no lesions, CERVIX: normal appearing cervix without discharge or lesions  Extremities: no edema   Results for orders placed or performed in visit on 10/08/18 (from the past 24 hour(s))  POCT  urine pregnancy   Collection Time: 10/08/18  2:41 PM  Result Value Ref Range   Preg Test, Ur Positive (A) Negative  POCT Wet Prep [facility])   Collection Time: 10/08/18  3:02 PM  Result Value Ref Range   Source Wet Prep POC vaginal    WBC, Wet Prep HPF POC few    Bacteria Wet Prep HPF POC Few Few   BACTERIA WET PREP MORPHOLOGY POC     Clue Cells Wet Prep HPF POC Many (A) None   Clue Cells Wet Prep Whiff POC Positive Whiff    Yeast Wet Prep HPF POC None None   KOH Wet Prep POC     Trichomonas Wet Prep HPF POC Absent Absent    Assessment & Plan:  1) [redacted]w[redacted]d pregnant by LMP w/ spotting> will check progesterone and hcg d/t spotting. Plan dating u/s next week. Continue pnv. O+ bloodtype  2) BV> Rx metronidazole 500mg  BID x 7d for BV, no sex while taking   Meds:  Meds ordered this encounter  Medications  . metroNIDAZOLE (FLAGYL) 500 MG tablet    Sig: Take 1 tablet (500 mg total) by mouth 2 (two) times daily.    Dispense:  14 tablet    Refill:  0    Order Specific Question:   Supervising Provider    Answer:   Duane Lope H [[id number]]    Orders Placed This Encounter  Procedures  . GC/Chlamydia Probe Amp  . US OB Comp Less 14 Wks  . Beta hCG quant (ref lab)  . Progesterone  . POCT urine pregnancy  . POCT Wet Prep [facility])    Return in about 1 week (  around 10/15/2018) for dating u/s.  Cheral MarkerKimberly R Azhar Yogi CNM, [facility]NP-BC 10/08/2018 3:04 PM

## 2018-10-09 ENCOUNTER — Other Ambulatory Visit: Payer: Self-pay | Admitting: Women's Health

## 2018-10-09 DIAGNOSIS — O26851 Spotting complicating pregnancy, first trimester: Secondary | ICD-10-CM | POA: Diagnosis not present

## 2018-10-09 LAB — BETA HCG QUANT (REF LAB): hCG Quant: 47558 m[IU]/mL

## 2018-10-09 LAB — PROGESTERONE: PROGESTERONE: 10.7 ng/mL

## 2018-10-09 MED ORDER — PROGESTERONE MICRONIZED 200 MG PO CAPS: 200.0000 mg | ORAL_CAPSULE | Freq: Every day | ORAL | 5 refills | Status: DC

## 2018-10-10 ENCOUNTER — Telehealth: Payer: Self-pay | Admitting: *Deleted

## 2018-10-10 NOTE — Telephone Encounter (Signed)
Patient informed progesterone low so Michelle Short has sent progesterone prescription to pharmacy.  Vaginal inserts need to be inserted at night and come to dating U/S as scheduled. Verbalized understanding.

## 2018-10-11 ENCOUNTER — Ambulatory Visit: Payer: Medicaid Other | Admitting: Adult Health

## 2018-10-12 LAB — GC/CHLAMYDIA PROBE AMP
Chlamydia trachomatis, NAA: NEGATIVE
Neisseria gonorrhoeae by PCR: NEGATIVE

## 2018-10-13 ENCOUNTER — Emergency Department (HOSPITAL_COMMUNITY)
Admission: EM | Admit: 2018-10-13 | Discharge: 2018-10-13 | Disposition: A | Discharge status: Home / Self Care | Payer: Medicaid Other | Attending: Emergency Medicine | Admitting: Emergency Medicine

## 2018-10-13 ENCOUNTER — Other Ambulatory Visit: Payer: Self-pay

## 2018-10-13 DIAGNOSIS — Z3A01 Less than 8 weeks gestation of pregnancy: Secondary | ICD-10-CM | POA: Diagnosis not present

## 2018-10-13 DIAGNOSIS — O210 Mild hyperemesis gravidarum: Secondary | ICD-10-CM | POA: Insufficient documentation

## 2018-10-13 DIAGNOSIS — O219 Vomiting of pregnancy, unspecified: Secondary | ICD-10-CM | POA: Diagnosis present

## 2018-10-13 DIAGNOSIS — Z79899 Other long term (current) drug therapy: Secondary | ICD-10-CM | POA: Diagnosis not present

## 2018-10-13 LAB — CBC WITH DIFFERENTIAL/PLATELET
Abs Immature Granulocytes: 0.02 10*3/uL (ref 0.00–0.07)
BASOS ABS: 0 10*3/uL (ref 0.0–0.1)
Basophils Relative: 0 %
Eosinophils Absolute: 0 10*3/uL (ref 0.0–0.5)
Eosinophils Relative: 0 %
HEMATOCRIT: 37.3 % (ref 36.0–46.0)
HEMOGLOBIN: 11.9 g/dL — AB (ref 12.0–15.0)
IMMATURE GRANULOCYTES: 0 %
LYMPHS ABS: 1 10*3/uL (ref 0.7–4.0)
LYMPHS PCT: 13 %
MCH: 28.6 pg (ref 26.0–34.0)
MCHC: 31.9 g/dL (ref 30.0–36.0)
MCV: 89.7 fL (ref 80.0–100.0)
Monocytes Absolute: 0.4 10*3/uL (ref 0.1–1.0)
Monocytes Relative: 5 %
NEUTROS PCT: 82 %
NRBC: 0 % (ref 0.0–0.2)
Neutro Abs: 6.4 10*3/uL (ref 1.7–7.7)
Platelets: 265 10*3/uL (ref 150–400)
RBC: 4.16 MIL/uL (ref 3.87–5.11)
RDW: 12 % (ref 11.5–15.5)
WBC: 7.8 10*3/uL (ref 4.0–10.5)

## 2018-10-13 LAB — BASIC METABOLIC PANEL
ANION GAP: 11 (ref 5–15)
BUN: 13 mg/dL (ref 6–20)
CHLORIDE: 102 mmol/L (ref 98–111)
CO2: 21 mmol/L — AB (ref 22–32)
Calcium: 9.1 mg/dL (ref 8.9–10.3)
Creatinine, Ser: 0.73 mg/dL (ref 0.44–1.00)
GFR calc non Af Amer: >60 mL/min (ref >60)
Glucose, Bld: 73 mg/dL (ref 70–99)
Potassium: 3.5 mmol/L (ref 3.5–5.1)
Sodium: 134 mmol/L — ABNORMAL LOW (ref 135–145)

## 2018-10-13 MED ORDER — ONDANSETRON HCL 4 MG/2ML IJ SOLN
4.0000 mg | Freq: Once | INTRAMUSCULAR | Status: AC
  Administered 2018-10-13: 4 mg via INTRAVENOUS
  Filled 2018-10-13: qty 2

## 2018-10-13 MED ORDER — PROMETHAZINE HCL 25 MG RE SUPP: 25.0000 mg | Freq: Four times a day (QID) | RECTAL | 0 refills | Status: DC | PRN

## 2018-10-13 MED ORDER — PROMETHAZINE HCL 25 MG PO TABS: 25.0000 mg | ORAL_TABLET | Freq: Four times a day (QID) | ORAL | 0 refills | Status: DC | PRN

## 2018-10-13 MED ORDER — SODIUM CHLORIDE 0.9 % IV BOLUS
1000.0000 mL | Freq: Once | INTRAVENOUS | Status: AC
  Administered 2018-10-13: 1000 mL via INTRAVENOUS

## 2018-10-13 NOTE — ED Notes (Signed)
Pt not able to void at this time.   Given more water for fluid challenge.

## 2018-10-13 NOTE — ED Notes (Signed)
Given water and ginger-ale. 

## 2018-10-13 NOTE — ED Triage Notes (Signed)
Pt reports she is [redacted] weeks pregnant hx of 1 miscarriage. Sees [facility]. Pt reports vomiting x 1 week, unable to keep anything down, states she has really ate in the last week, now having trouble keeping liquids down. Has not been given anything to treat nausea from OB.

## 2018-10-13 NOTE — Discharge Instructions (Addendum)
You were evaluated in the emergency department for vomiting in the setting of being pregnant.  Your lab work did not show any serious findings.  I discussed this with Dr. Despina HiddenEure from [facility] and he recommends sending you home with prescriptions for Phenergan.  There are Phenergan suppositories to use rectally and Phenergan oral tablets.  Please keep your appointment on Monday with [facility].

## 2018-10-13 NOTE — ED Provider Notes (Signed)
[facility]ERGENCY DEPARTMENT Provider Note   CSN: [id number] Arrival date & time: 10/13/18  1145     History   Chief Complaint Chief Complaint  Patient presents with  . Emesis During Pregnancy    HPI Michelle Short is a 27 y.o. female.  She is approximately [redacted] weeks pregnant by dates.  She follows with [facility].  She is coming in here with 1 week of nausea and vomiting.  She feels weak and generally unwell.  No fevers no chills no abdominal pain no vaginal bleeding or discharge.  She has not tried any thing and did not contact her OB.  She has an appointment on Monday.  The history is provided by the patient.  Emesis   This is a new problem. The current episode started more than 1 week ago. The problem occurs 5 to 10 times per day. The problem has not changed since onset.The emesis has an appearance of stomach contents. There has been no fever. Pertinent negatives include no abdominal pain, no arthralgias, no chills, no cough, no diarrhea, no fever, no headaches, no myalgias, no sweats and no URI. Risk factors: pregnancy.    Past Medical History:  Diagnosis Date  . Common migraine with intractable migraine 09/19/2018  . Headache   . Marijuana abuse   . Seizures (HCC) 12/2017    Patient Active Problem List   Diagnosis Date Noted  . Common migraine with intractable migraine 09/19/2018  . Cervical cancer screening 04/17/2018  . Trichomonas infection 02/19/2018  . Seizure (HCC) 01/11/2018  . Missed abortion 01/11/2018  . Marijuana abuse   . Acute lower UTI     No past surgical history on file.   OB History    Gravida  2   Para      Term      Preterm      AB  1   Living        SAB  1   TAB      Ectopic      Multiple      Live Births               Home Medications    Prior to Admission medications   Medication Sig Start Date End Date Taking? Authorizing Provider  levETIRAcetam (KEPPRA) 500 MG tablet Take 500 mg by mouth 2 (two)  times daily.   Yes [provider]  folic acid (FOLVITE) 1 MG tablet Take 1 tablet (1 mg total) by mouth daily. Patient not taking: Reported on 10/08/2018 09/19/18   [place] SpanielWillis, Charles K, MD  metroNIDAZOLE (FLAGYL) 500 MG tablet Take 1 tablet (500 mg total) by mouth 2 (two) times daily. 10/08/18   Cheral MarkerBooker, Kimberly R, CNM  Prenatal Vit-Fe Fumarate-FA (MULTIVITAMIN-PRENATAL) 27-0.8 MG TABS tablet Take 1 tablet by mouth daily at 12 noon.    [provider]  progesterone (PROMETRIUM) 200 MG capsule Place 1 capsule (200 mg total) vaginally at bedtime. 10/09/18   Cheral MarkerBooker, Kimberly R, CNM  rizatriptan (MAXALT) 10 MG tablet Take 1 tablet (10 mg total) by mouth 3 (three) times daily as needed for migraine. Patient not taking: Reported on 10/08/2018 09/19/18   [place] SpanielWillis, Charles K, MD  zonisamide (ZONEGRAN) 50 MG capsule One capsule twice a day for 2 weeks, then take 2 capsules twice a day Patient not taking: Reported on 10/08/2018 09/19/18   [place] SpanielWillis, Charles K, MD    Family History Family History  Problem Relation Age of Onset  .  Hyperlipidemia Mother   . Diabetes Paternal Grandfather   . Heart attack Maternal Grandfather     Social History Social History   Tobacco Use  . Smoking status: Never Smoker  . Smokeless tobacco: Never Used  Substance Use Topics  . Alcohol use: No  . Drug use: Yes    Types: Marijuana    Comment: sometimes      Allergies   Ibuprofen and Mosquito (diagnostic)   Review of Systems Review of Systems  Constitutional: Positive for appetite change and fatigue. Negative for chills and fever.  HENT: Negative for sore throat.   Eyes: Negative for visual disturbance.  Respiratory: Negative for cough and shortness of breath.   Cardiovascular: Negative for chest pain.  Gastrointestinal: Positive for vomiting. Negative for abdominal pain and diarrhea.  Genitourinary: Negative for dysuria.  Musculoskeletal: Negative for arthralgias and myalgias.  Skin:  Negative for rash.  Neurological: Negative for headaches.     Physical Exam Updated Vital Signs BP 122/79 (BP Location: Right Arm)   Pulse 97   Temp (!) 97.5 F (36.4 C) (Oral)   Resp 16   Ht 5\' 4"  (1.626 m)   Wt 45.8 kg   LMP 08/26/2018   SpO2 100%   BMI 17.34 kg/m   Physical Exam  Constitutional: She appears well-developed and well-nourished. No distress.  HENT:  Head: Normocephalic and atraumatic.  Eyes: Conjunctivae are normal.  Neck: Neck supple.  Cardiovascular: Normal rate, regular rhythm and normal heart sounds.  No murmur heard. Pulmonary/Chest: Effort normal and breath sounds normal. No respiratory distress.  Abdominal: Soft. She exhibits no mass. There is no tenderness. There is no guarding.  Musculoskeletal: Normal range of motion. She exhibits no edema, tenderness or deformity.  Neurological: She is alert.  Skin: Skin is warm and dry.  Psychiatric: She has a normal mood and affect.  Nursing note and vitals reviewed.    ED Treatments / Results  Labs (all labs ordered are listed, but only abnormal results are displayed) Labs Reviewed  BASIC METABOLIC PANEL - Abnormal; Notable for the following components:      Result Value   Sodium 134 (*)    CO2 21 (*)    All other components within normal limits  CBC WITH DIFFERENTIAL/PLATELET - Abnormal; Notable for the following components:   Hemoglobin 11.9 (*)    All other components within normal limits    EKG None  Radiology No results found.  Procedures Procedures (including critical care time)  Medications Ordered in ED Medications  sodium chloride 0.9 % bolus 1,000 mL (has no administration in time range)  ondansetron (ZOFRAN) injection 4 mg (has no administration in time range)     Initial Impression / Assessment and Plan / ED Course  I have reviewed the triage vital signs and the nursing notes.  Pertinent labs & imaging results that were available during my care of the patient were reviewed  by me and considered in my medical decision making (see chart for details).  Clinical Course as of Oct 14 1731  Sat Oct 13, 2018  1515 Patient is able to drink now and waiting for her to give a urine sample.   [MB]    Clinical Course User Index [MB] Terrilee Files, MD   Reviewed with Dr Anne Ng and he recommended home with phenergan. To followup in clinic Monday.   Final Clinical Impressions(s) / ED Diagnoses   Final diagnoses:  Hyperemesis gravidarum    ED Discharge Orders  Ordered    promethazine (PHENERGAN) 25 MG tablet  Every 6 hours PRN     10/13/18 1445    promethazine (PHENERGAN) 25 MG suppository  Every 6 hours PRN     10/13/18 1445           Terrilee Files, MD 10/13/18 1733

## 2018-10-15 ENCOUNTER — Ambulatory Visit (INDEPENDENT_AMBULATORY_CARE_PROVIDER_SITE_OTHER): Payer: Medicaid Other

## 2018-10-15 DIAGNOSIS — Z3491 Encounter for supervision of normal pregnancy, unspecified, first trimester: Secondary | ICD-10-CM

## 2018-10-15 NOTE — Progress Notes (Signed)
US 7+1 wks,single IUP w/ys,positive fht 150 bpm,normal ovaries bilat,subchorionic hemorrhage 2.8 x .6 x 2.2 cm

## 2018-10-20 ENCOUNTER — Other Ambulatory Visit: Payer: Self-pay

## 2018-10-20 ENCOUNTER — Encounter (HOSPITAL_COMMUNITY): Payer: Self-pay | Admitting: Emergency Medicine

## 2018-10-20 ENCOUNTER — Emergency Department (HOSPITAL_COMMUNITY)
Admission: EM | Admit: 2018-10-20 | Discharge: 2018-10-20 | Disposition: A | Discharge status: Home / Self Care | Payer: Medicaid Other | Attending: Emergency Medicine | Admitting: Emergency Medicine

## 2018-10-20 DIAGNOSIS — G43809 Other migraine, not intractable, without status migrainosus: Secondary | ICD-10-CM | POA: Diagnosis not present

## 2018-10-20 DIAGNOSIS — O9989 Other specified diseases and conditions complicating pregnancy, childbirth and the puerperium: Secondary | ICD-10-CM | POA: Diagnosis not present

## 2018-10-20 DIAGNOSIS — Z3A08 8 weeks gestation of pregnancy: Secondary | ICD-10-CM | POA: Insufficient documentation

## 2018-10-20 DIAGNOSIS — O219 Vomiting of pregnancy, unspecified: Secondary | ICD-10-CM | POA: Insufficient documentation

## 2018-10-20 DIAGNOSIS — Z79899 Other long term (current) drug therapy: Secondary | ICD-10-CM | POA: Insufficient documentation

## 2018-10-20 DIAGNOSIS — O26891 Other specified pregnancy related conditions, first trimester: Secondary | ICD-10-CM | POA: Insufficient documentation

## 2018-10-20 DIAGNOSIS — R112 Nausea with vomiting, unspecified: Secondary | ICD-10-CM

## 2018-10-20 LAB — CBC WITH DIFFERENTIAL/PLATELET
ABS IMMATURE GRANULOCYTES: 0.11 10*3/uL — AB (ref 0.00–0.07)
BASOS ABS: 0 10*3/uL (ref 0.0–0.1)
Basophils Relative: 0 %
EOS PCT: 0 %
Eosinophils Absolute: 0 10*3/uL (ref 0.0–0.5)
HEMATOCRIT: 38.4 % (ref 36.0–46.0)
HEMOGLOBIN: 12.4 g/dL (ref 12.0–15.0)
Immature Granulocytes: 1 %
LYMPHS ABS: 0.9 10*3/uL (ref 0.7–4.0)
LYMPHS PCT: 5 %
MCH: 28.5 pg (ref 26.0–34.0)
MCHC: 32.3 g/dL (ref 30.0–36.0)
MCV: 88.3 fL (ref 80.0–100.0)
Monocytes Absolute: 0.6 10*3/uL (ref 0.1–1.0)
Monocytes Relative: 4 %
NEUTROS ABS: 15.4 10*3/uL — AB (ref 1.7–7.7)
NRBC: 0 % (ref 0.0–0.2)
Neutrophils Relative %: 90 %
Platelets: 260 10*3/uL (ref 150–400)
RBC: 4.35 MIL/uL (ref 3.87–5.11)
RDW: 12.1 % (ref 11.5–15.5)
WBC: 17 10*3/uL — ABNORMAL HIGH (ref 4.0–10.5)

## 2018-10-20 LAB — URINALYSIS, ROUTINE W REFLEX MICROSCOPIC
BILIRUBIN URINE: NEGATIVE
Glucose, UA: NEGATIVE mg/dL
Hgb urine dipstick: NEGATIVE
Ketones, ur: NEGATIVE mg/dL
LEUKOCYTES UA: NEGATIVE
Nitrite: NEGATIVE
PH: 6 (ref 5.0–8.0)
Protein, ur: 30 mg/dL — AB
SPECIFIC GRAVITY, URINE: 1.014 (ref 1.005–1.030)

## 2018-10-20 LAB — COMPREHENSIVE METABOLIC PANEL
ALBUMIN: 4.1 g/dL (ref 3.5–5.0)
ALK PHOS: 47 U/L (ref 38–126)
ALT: 13 U/L (ref 0–44)
ANION GAP: 7 (ref 5–15)
AST: 22 U/L (ref 15–41)
BILIRUBIN TOTAL: 0.6 mg/dL (ref 0.3–1.2)
BUN: 7 mg/dL (ref 6–20)
CALCIUM: 9.1 mg/dL (ref 8.9–10.3)
CO2: 21 mmol/L — AB (ref 22–32)
CREATININE: 0.55 mg/dL (ref 0.44–1.00)
Chloride: 105 mmol/L (ref 98–111)
GFR calc Af Amer: >60 mL/min (ref >60)
GFR calc non Af Amer: >60 mL/min (ref >60)
GLUCOSE: 88 mg/dL (ref 70–99)
Potassium: 3.5 mmol/L (ref 3.5–5.1)
SODIUM: 133 mmol/L — AB (ref 135–145)
TOTAL PROTEIN: 7.2 g/dL (ref 6.5–8.1)

## 2018-10-20 MED ORDER — ACETAMINOPHEN 500 MG PO TABS
1000.0000 mg | ORAL_TABLET | Freq: Once | ORAL | Status: AC
  Administered 2018-10-20: 1000 mg via ORAL
  Filled 2018-10-20: qty 2

## 2018-10-20 MED ORDER — SODIUM CHLORIDE 0.9 % IV BOLUS
1000.0000 mL | Freq: Once | INTRAVENOUS | Status: AC
  Administered 2018-10-20: 1000 mL via INTRAVENOUS

## 2018-10-20 MED ORDER — METOCLOPRAMIDE HCL 5 MG/ML IJ SOLN
10.0000 mg | Freq: Once | INTRAMUSCULAR | Status: AC
  Administered 2018-10-20: 10 mg via INTRAVENOUS
  Filled 2018-10-20: qty 2

## 2018-10-20 MED ORDER — DIPHENHYDRAMINE HCL 50 MG/ML IJ SOLN
25.0000 mg | Freq: Once | INTRAMUSCULAR | Status: AC
  Administered 2018-10-20: 25 mg via INTRAVENOUS
  Filled 2018-10-20: qty 1

## 2018-10-20 NOTE — ED Triage Notes (Signed)
Patient reports headache and emesis since this am. States she is [redacted] weeks pregnant.

## 2018-10-20 NOTE — ED Notes (Signed)
Pt out of bed and walked in dept  Ambulated heel to toe without stagger or drift   Reports headache of 7/10 having been asleep

## 2018-10-20 NOTE — ED Provider Notes (Signed)
[facility] EMERGENCY DEPARTMENT Provider Note   CSN: [id number] Arrival date & time: 10/20/18  1525     History   Chief Complaint Chief Complaint  Patient presents with  . Emesis    HPI Michelle Short is a 27 y.o. female.  HPI Patient is roughly [redacted] weeks pregnant by dates.  Had ultrasound confirmed IUP last week.  States she woke this morning with typical migraine symptoms, nausea and vomiting.  She has not been able to eat or drink since onset of headache this morning.  Endorses photophobia and phonophobia.  No focal weakness or numbness.  Denies abdominal pain.  No vaginal bleeding or discharge. Past Medical History:  Diagnosis Date  . Common migraine with intractable migraine 09/19/2018  . Headache   . Marijuana abuse   . Seizures (HCC) 12/2017    Patient Active Problem List   Diagnosis Date Noted  . Common migraine with intractable migraine 09/19/2018  . Cervical cancer screening 04/17/2018  . Trichomonas infection 02/19/2018  . Seizure (HCC) 01/11/2018  . Missed abortion 01/11/2018  . Marijuana abuse   . Acute lower UTI     History reviewed. No pertinent surgical history.   OB History    Gravida  2   Para      Term      Preterm      AB  1   Living        SAB  1   TAB      Ectopic      Multiple      Live Births               Home Medications    Prior to Admission medications   Medication Sig Start Date End Date Taking? Authorizing Provider  levETIRAcetam (KEPPRA) 500 MG tablet Take 250 mg by mouth 2 (two) times daily.    Yes [provider]  metroNIDAZOLE (FLAGYL) 500 MG tablet Take 1 tablet (500 mg total) by mouth 2 (two) times daily. 10/08/18  Yes Cheral Marker, CNM  Prenatal Vit-Fe Fumarate-FA (MULTIVITAMIN-PRENATAL) 27-0.8 MG TABS tablet Take 1 tablet by mouth daily at 12 noon.   Yes [provider]  progesterone (PROMETRIUM) 200 MG capsule Place 1 capsule (200 mg total) vaginally at bedtime. 10/09/18   Yes Cheral Marker, CNM  promethazine (PHENERGAN) 25 MG suppository Place 1 suppository (25 mg total) rectally every 6 (six) hours as needed for nausea or vomiting. 10/13/18  Yes Terrilee Files, MD  rizatriptan (MAXALT) 10 MG tablet Take 1 tablet (10 mg total) by mouth 3 (three) times daily as needed for migraine. 09/19/18  Yes York Spaniel, MD  promethazine (PHENERGAN) 25 MG tablet Take 1 tablet (25 mg total) by mouth every 6 (six) hours as needed for nausea or vomiting. 10/13/18   Terrilee Files, MD    Family History Family History  Problem Relation Age of Onset  . Hyperlipidemia Mother   . Diabetes Paternal Grandfather   . Heart attack Maternal Grandfather     Social History Social History   Tobacco Use  . Smoking status: Never Smoker  . Smokeless tobacco: Never Used  Substance Use Topics  . Alcohol use: No  . Drug use: Yes    Types: Marijuana    Comment: sometimes      Allergies   Ibuprofen and Mosquito (diagnostic)   Review of Systems Review of Systems  Constitutional: Positive for fatigue. Negative for chills and fever.  HENT: Negative  for sore throat and trouble swallowing.   Eyes: Positive for photophobia.  Respiratory: Negative for cough and shortness of breath.   Gastrointestinal: Positive for nausea and vomiting. Negative for abdominal pain, constipation and diarrhea.  Genitourinary: Negative for pelvic pain, vaginal bleeding and vaginal discharge.  Musculoskeletal: Negative for back pain, myalgias and neck pain.  Skin: Negative for rash and wound.  Neurological: Positive for headaches. Negative for dizziness, syncope, weakness, light-headedness and numbness.  All other systems reviewed and are negative.    Physical Exam Updated Vital Signs BP 105/65   Pulse 88   Temp 97.9 F (36.6 C) (Oral)   Resp 16   Ht 5\' 4"  (1.626 m)   Wt 45.8 kg   LMP 08/26/2018 (Approximate)   SpO2 100%   BMI 17.34 kg/m   Physical Exam  Constitutional: She  is oriented to person, place, and time. She appears well-developed and well-nourished.  HENT:  Head: Normocephalic and atraumatic.  Mouth/Throat: Oropharynx is clear and moist. No oropharyngeal exudate.  Eyes: Pupils are equal, round, and reactive to light. EOM are normal.  Photophobia  Neck: Normal range of motion. Neck supple.  No meningismus  Cardiovascular: Normal rate and regular rhythm. Exam reveals no gallop and no friction rub.  No murmur heard. Pulmonary/Chest: Effort normal and breath sounds normal. No stridor. No respiratory distress. She has no wheezes. She has no rales. She exhibits no tenderness.  Abdominal: Soft. Bowel sounds are normal. There is no tenderness. There is no rebound and no guarding.  Musculoskeletal: Normal range of motion. She exhibits no edema or tenderness.  Neurological: She is alert and oriented to person, place, and time.  Moving all extremities without focal deficit.  Sensation fully intact.  Skin: Skin is warm and dry. Capillary refill takes less than 2 seconds. No rash noted. No erythema.  Psychiatric: She has a normal mood and affect. Her behavior is normal.  Nursing note and vitals reviewed.    ED Treatments / Results  Labs (all labs ordered are listed, but only abnormal results are displayed) Labs Reviewed  CBC WITH DIFFERENTIAL/PLATELET - Abnormal; Notable for the following components:      Result Value   WBC 17.0 (*)    Neutro Abs 15.4 (*)    Abs Immature Granulocytes 0.11 (*)    All other components within normal limits  COMPREHENSIVE METABOLIC PANEL - Abnormal; Notable for the following components:   Sodium 133 (*)    CO2 21 (*)    All other components within normal limits  URINALYSIS, ROUTINE W REFLEX MICROSCOPIC - Abnormal; Notable for the following components:   APPearance CLOUDY (*)    Protein, ur 30 (*)    Bacteria, UA RARE (*)    All other components within normal limits    EKG None  Radiology No results  found.  Procedures Procedures (including critical care time)  Medications Ordered in ED Medications  metoCLOPramide (REGLAN) injection 10 mg (10 mg Intravenous Given 10/20/18 1705)  sodium chloride 0.9 % bolus 1,000 mL (0 mLs Intravenous Stopped 10/20/18 1843)  diphenhydrAMINE (BENADRYL) injection 25 mg (25 mg Intravenous Given 10/20/18 1706)  acetaminophen (TYLENOL) tablet 1,000 mg (1,000 mg Oral Given 10/20/18 1807)  sodium chloride 0.9 % bolus 1,000 mL (0 mLs Intravenous Stopped 10/20/18 2112)     Initial Impression / Assessment and Plan / ED Course  I have reviewed the triage vital signs and the nursing notes.  Pertinent labs & imaging results that were available during my care  of the patient were reviewed by me and considered in my medical decision making (see chart for details).     Headache is improved.  Patient is drinking fluids and ambulating without difficulty.  No further vomiting in the emergency department. Final Clinical Impressions(s) / ED Diagnoses   Final diagnoses:  Other migraine without status migrainosus, not intractable  Non-intractable vomiting with nausea, unspecified vomiting type    ED Discharge Orders    None       Loren RacerYelverton, Mikeria Valin, MD 10/22/18 [phone]

## 2018-10-20 NOTE — ED Notes (Signed)
Out of bed to give urine spec

## 2018-10-20 NOTE — ED Notes (Signed)
Pt has had no emesis since arrival  

## 2018-10-20 NOTE — ED Notes (Signed)
Patient understands we need a urine sample. Pt refused to give sample at this time.

## 2018-10-23 ENCOUNTER — Telehealth: Payer: Self-pay | Admitting: Neurology

## 2018-10-23 NOTE — Telephone Encounter (Signed)
LMTC./fim 

## 2018-10-23 NOTE — Telephone Encounter (Signed)
Pt requesting a call stating she is currently [redacted] weeks pregnant wanting to make sure medication levETIRAcetam (KEPPRA) 500 MG tablet is safe for her to take.

## 2018-10-24 NOTE — Telephone Encounter (Signed)
Spoke with Michelle Short. It looks like she has had quite a bit of n/v assoc. with her pregnancy, which may be preventing her from absorbing her full dose of Keppra.  We discussed that pregnancy and the n/v can affect the efficacy of certain medications. It would be best to discuss a plan for sz. control during pregnancy w/i the confines of an ov. She verbalized understanding of same--sts. she was napping at her grandmother's home a few days ago and believes she had a sz.  Appt. given for tomorrow, arrival time 1130 for a 1200 appt. She is agreeable/fim

## 2018-10-25 ENCOUNTER — Encounter: Payer: Self-pay | Admitting: Neurology

## 2018-10-25 ENCOUNTER — Ambulatory Visit: Payer: Self-pay | Admitting: Neurology

## 2018-10-25 ENCOUNTER — Ambulatory Visit (INDEPENDENT_AMBULATORY_CARE_PROVIDER_SITE_OTHER): Payer: Medicaid Other | Admitting: Neurology

## 2018-10-25 VITALS — BP 120/75 | HR 95 | Wt 100.5 lb

## 2018-10-25 DIAGNOSIS — R569 Unspecified convulsions: Secondary | ICD-10-CM | POA: Diagnosis not present

## 2018-10-25 NOTE — Progress Notes (Signed)
Reason for visit: Seizures  Michelle Short is an 27 y.o. female  History of present illness:  Michelle Short is a 27 year old black female with a history of migraine headaches and seizures.  The patient currently is pregnant, she is [redacted] weeks along and she is having hyperemesis gravidarum.  The patient has had difficulty keeping her medications in, because of this, she had a nocturnal seizure on 21 October 2018.  All of her seizures occur at night while sleeping.  The patient is on low-dose Keppra taking 250 mg twice daily.  The patient has been treating her nausea with Phenergan, she has been able to control the nausea better lately, she is now getting her medications in.  He has stopped driving following the seizure.  She returns to this office for an evaluation.  Past Medical History:  Diagnosis Date  . Common migraine with intractable migraine 09/19/2018  . Headache   . Marijuana abuse   . Seizures (HCC) 12/2017    No past surgical history on file.  Family History  Problem Relation Age of Onset  . Hyperlipidemia Mother   . Diabetes Paternal Grandfather   . Heart attack Maternal Grandfather     Social history:  reports that she has never smoked. She has never used smokeless tobacco. She reports that she has current or past drug history. Drug: Marijuana. She reports that she does not drink alcohol.    Allergies  Allergen Reactions  . Ibuprofen Other (See Comments)    "interferes with my seizure medication"  . Mosquito (Diagnostic)     Medications:  Prior to Admission medications   Medication Sig Start Date End Date Taking? Authorizing Provider  levETIRAcetam (KEPPRA) 500 MG tablet Take 250 mg by mouth 2 (two) times daily.     [provider]  metroNIDAZOLE (FLAGYL) 500 MG tablet Take 1 tablet (500 mg total) by mouth 2 (two) times daily. 10/08/18   Cheral Marker, CNM  Prenatal Vit-Fe Fumarate-FA (MULTIVITAMIN-PRENATAL) 27-0.8 MG TABS tablet Take 1 tablet by  mouth daily at 12 noon.    [provider]  progesterone (PROMETRIUM) 200 MG capsule Place 1 capsule (200 mg total) vaginally at bedtime. 10/09/18   Cheral Marker, CNM  promethazine (PHENERGAN) 25 MG suppository Place 1 suppository (25 mg total) rectally every 6 (six) hours as needed for nausea or vomiting. 10/13/18   Terrilee Files, MD  promethazine (PHENERGAN) 25 MG tablet Take 1 tablet (25 mg total) by mouth every 6 (six) hours as needed for nausea or vomiting. 10/13/18   Terrilee Files, MD  rizatriptan (MAXALT) 10 MG tablet Take 1 tablet (10 mg total) by mouth 3 (three) times daily as needed for migraine. 09/19/18   York Spaniel, MD    ROS:  Out of a complete 14 system review of symptoms, the patient complains only of the following symptoms, and all other reviewed systems are negative.  Fatigue Eye discharge Shortness of breath Nausea Insomnia, frequent waking, daytime sleepiness, acting out dreams Frequency of urination, urinary urgency Aching muscles, muscle cramps Memory loss, dizziness, headache, seizures, weakness, passing out Agitation, confusion, decreased concentration, anxiety, hallucinations  Last menstrual period 08/26/2018, unknown if currently breastfeeding.  Physical Exam  General: The patient is alert and cooperative at the time of the examination.  Skin: No significant peripheral edema is noted.   Neurologic Exam  Mental status: The patient is alert and oriented x 3 at the time of the examination. The patient has  apparent normal recent and remote memory, with an apparently normal attention span and concentration ability.   Cranial nerves: Facial symmetry is present. Speech is normal, no aphasia or dysarthria is noted. Extraocular movements are full. Visual fields are full.  Motor: The patient has good strength in all 4 extremities.  Sensory examination: Soft touch sensation is symmetric on the face, arms, and legs.  Coordination:  The patient has good finger-nose-finger and heel-to-shin bilaterally.  Gait and station: The patient has a normal gait. Tandem gait is normal. Romberg is negative. No drift is seen.  Reflexes: Deep tendon reflexes are symmetric.   Assessment/Plan:  1.  History of seizures with recent recurrence  2.  Hyperemesis gravidarum  The patient had a recent seizure when she was not able to get her medications and properly.  She is now doing better with this.  She will continue the Keppra 250 mg twice daily, she will return in about 4 months, we may need to go up on the dose in the third trimester of her pregnancy.  She is on prenatal vitamins.  She is not to operate a motor vehicle for 6 months from her last seizure.  Fortunately, all of her seizures appear to be nocturnal in nature.  Michelle Palau. Keith  MD 10/25/2018 12:55 PM  [facility] [address] [place], [place] [id number]  Phone [phone] Fax [phone]

## 2018-10-30 ENCOUNTER — Encounter: Payer: Medicaid Other | Admitting: Women's Health

## 2018-10-30 ENCOUNTER — Ambulatory Visit: Payer: Medicaid Other | Admitting: *Deleted

## 2018-10-31 ENCOUNTER — Encounter: Payer: Self-pay | Admitting: Advanced Practice Midwife

## 2018-10-31 ENCOUNTER — Ambulatory Visit (INDEPENDENT_AMBULATORY_CARE_PROVIDER_SITE_OTHER): Payer: Medicaid Other | Admitting: Advanced Practice Midwife

## 2018-10-31 ENCOUNTER — Ambulatory Visit: Payer: Medicaid Other | Admitting: *Deleted

## 2018-10-31 VITALS — BP 124/94 | HR 105 | Wt 103.0 lb

## 2018-10-31 DIAGNOSIS — Z1389 Encounter for screening for other disorder: Secondary | ICD-10-CM

## 2018-10-31 DIAGNOSIS — R569 Unspecified convulsions: Secondary | ICD-10-CM

## 2018-10-31 DIAGNOSIS — Z331 Pregnant state, incidental: Secondary | ICD-10-CM

## 2018-10-31 DIAGNOSIS — Z3682 Encounter for antenatal screening for nuchal translucency: Secondary | ICD-10-CM

## 2018-10-31 DIAGNOSIS — Z3A09 9 weeks gestation of pregnancy: Secondary | ICD-10-CM | POA: Diagnosis not present

## 2018-10-31 DIAGNOSIS — Z3490 Encounter for supervision of normal pregnancy, unspecified, unspecified trimester: Secondary | ICD-10-CM | POA: Insufficient documentation

## 2018-10-31 DIAGNOSIS — Z3481 Encounter for supervision of other normal pregnancy, first trimester: Secondary | ICD-10-CM

## 2018-10-31 LAB — POCT URINALYSIS DIPSTICK OB
Blood, UA: NEGATIVE
Glucose, UA: NEGATIVE
KETONES UA: NEGATIVE
LEUKOCYTES UA: NEGATIVE
Nitrite, UA: NEGATIVE
PROTEIN: NEGATIVE

## 2018-10-31 NOTE — Progress Notes (Signed)
INITIAL OBSTETRICAL VISIT Patient name: Michelle Short MRN [id number]  Date of birth: 1991-01-12 Chief Complaint:   Initial Prenatal Visit  History of Present Illness:   Michelle Short is a 27 y.o. G37P0010 African American female at [redacted]w[redacted]d by [place] with an Estimated Date of Delivery: 06/02/19 being seen today for her initial obstetrical visit.   Her obstetrical history is significant for early SAB, seizure disorder, dx 10 months ago. Had a seizure in [place], then one 2 weeks ago--getting better. Saw neuro last Thursday. .   Today she reports feeling good--had some vomiting earlier in pg, none now, feels good. .  Patient's last menstrual period was 08/26/2018 (approximate). Last pap 2017. Results were: normal Review of Systems:   Pertinent items are noted in HPI Denies cramping/contractions, leakage of fluid, vaginal bleeding, abnormal vaginal discharge w/ itching/odor/irritation, headaches, visual changes, shortness of breath, chest pain, abdominal pain, severe nausea/vomiting, or problems with urination or bowel movements unless otherwise stated above.  Pertinent History Reviewed:  Reviewed past medical,surgical, social, obstetrical and family history.  Reviewed problem list, medications and allergies. OB History  Gravida Para Term Preterm AB Living  2       1    SAB TAB Ectopic Multiple Live Births  1            # Outcome Date GA Lbr Len/2nd Weight Sex Delivery Anes PTL Lv  2 Current           1 SAB 02/18/18           Physical Assessment:   Vitals:   10/31/18 1432  BP: (!) 124/94  Pulse: (!) 105  Weight: 103 lb (46.7 kg)  Body mass index is 17.68 kg/m.       Physical Examination:  General appearance - well appearing, and in no distress  Mental status - alert, oriented to person, place, and time  Psych:  She has a normal mood and affect  Skin - warm and dry, normal color, no suspicious lesions noted  Chest - effort normal, all lung fields clear to auscultation  bilaterally  Heart - normal rate and regular rhythm  Abdomen - soft, nontender  Extremities:  No swelling or varicosities noted   Pelvic - VULVA: normal appearing vulva with no masses, tenderness or lesions  VAGINA: normal appearing vagina with normal color and discharge, no lesions  CERVIX: normal appearing cervix without discharge or lesions, no CMT   Results for orders placed or performed in visit on 10/31/18 (from the past 24 hour(s))  POC Urinalysis Dipstick OB   Collection Time: 10/31/18  2:46 PM  Result Value Ref Range   Color, UA     Clarity, UA     Glucose, UA Negative Negative   Bilirubin, UA     Ketones, UA neg    Spec Grav, UA     Blood, UA neg    pH, UA     POC,PROTEIN,UA Negative Negative, Trace, Small (1+), Moderate (2+), Large (3+), 4+   Urobilinogen, UA     Nitrite, UA neg    Leukocytes, UA Negative Negative   Appearance     Odor      Assessment & Plan:  1) Low-Risk Pregnancy G2P0010 at [redacted]w[redacted]d with an Estimated Date of Delivery: 06/02/19   2) Initial OB visit  3) Seizure disorder on Keppra  Meds: No orders of the defined types were placed in this encounter.   Initial labs obtained Continue prenatal vitamins Reviewed n/v relief measures  and warning s/s to report Reviewed recommended weight gain based on pre-gravid BMI Encouraged well-balanced diet Genetic Screening discussed Integrated Screen: requested Cystic fibrosis screening discussed requested Ultrasound discussed; fetal survey: requested CCNC completed  Follow-up: Return in about 4 weeks (around 11/28/2018) for LROB, US:NT+1st IT.   Orders Placed This Encounter  Procedures  . GC/Chlamydia Probe Amp  . Urine Culture  . US Fetal Nuchal Translucency Measurement  . Obstetric Panel, Including HIV  . Urinalysis, Routine w reflex microscopic  . Sickle cell screen  . Pain Management Screening Profile (10S)  . Cystic Fibrosis Mutation 97  . POC Urinalysis Dipstick OB    Jacklyn ShellFrances Cresenzo-Dishmon DNP,  CNM 10/31/2018 3:08 PM

## 2018-10-31 NOTE — Patient Instructions (Signed)
 First Trimester of Pregnancy The first trimester of pregnancy is from week 1 until the end of week 12 (months 1 through 3). A week after a sperm fertilizes an egg, the egg will implant on the wall of the uterus. This embryo will begin to develop into a baby. Genes from you and your partner are forming the baby. The female genes determine whether the baby is a boy or a girl. At 6-8 weeks, the eyes and face are formed, and the heartbeat can be seen on ultrasound. At the end of 12 weeks, all the baby's organs are formed.  Now that you are pregnant, you will want to do everything you can to have a healthy baby. Two of the most important things are to get good prenatal care and to follow your health care provider's instructions. Prenatal care is all the medical care you receive before the baby's birth. This care will help prevent, find, and treat any problems during the pregnancy and childbirth. BODY CHANGES Your body goes through many changes during pregnancy. The changes vary from woman to woman.   You may gain or lose a couple of pounds at first.  You may feel sick to your stomach (nauseous) and throw up (vomit). If the vomiting is uncontrollable, call your health care provider.  You may tire easily.  You may develop headaches that can be relieved by medicines approved by your health care provider.  You may urinate more often. Painful urination may mean you have a bladder infection.  You may develop heartburn as a result of your pregnancy.  You may develop constipation because certain hormones are causing the muscles that push waste through your intestines to slow down.  You may develop hemorrhoids or swollen, bulging veins (varicose veins).  Your breasts may begin to grow larger and become tender. Your nipples may stick out more, and the tissue that surrounds them (areola) may become darker.  Your gums may bleed and may be sensitive to brushing and flossing.  Dark spots or blotches  (chloasma, mask of pregnancy) may develop on your face. This will likely fade after the baby is born.  Your menstrual periods will stop.  You may have a loss of appetite.  You may develop cravings for certain kinds of food.  You may have changes in your emotions from day to day, such as being excited to be pregnant or being concerned that something may go wrong with the pregnancy and baby.  You may have more vivid and strange dreams.  You may have changes in your hair. These can include thickening of your hair, rapid growth, and changes in texture. Some women also have hair loss during or after pregnancy, or hair that feels dry or thin. Your hair will most likely return to normal after your baby is born. WHAT TO EXPECT AT YOUR PRENATAL VISITS During a routine prenatal visit:  You will be weighed to make sure you and the baby are growing normally.  Your blood pressure will be taken.  Your abdomen will be measured to track your baby's growth.  The fetal heartbeat will be listened to starting around week 10 or 12 of your pregnancy.  Test results from any previous visits will be discussed. Your health care provider may ask you:  How you are feeling.  If you are feeling the baby move.  If you have had any abnormal symptoms, such as leaking fluid, bleeding, severe headaches, or abdominal cramping.  If you have any questions. Other   tests that may be performed during your first trimester include:  Blood tests to find your blood type and to check for the presence of any previous infections. They will also be used to check for low iron levels (anemia) and Rh antibodies. Later in the pregnancy, blood tests for diabetes will be done along with other tests if problems develop.  Urine tests to check for infections, diabetes, or protein in the urine.  An ultrasound to confirm the proper growth and development of the baby.  An amniocentesis to check for possible genetic problems.  Fetal  screens for spina bifida and Down syndrome.  You may need other tests to make sure you and the baby are doing well. HOME CARE INSTRUCTIONS  Medicines  Follow your health care provider's instructions regarding medicine use. Specific medicines may be either safe or unsafe to take during pregnancy.  Take your prenatal vitamins as directed.  If you develop constipation, try taking a stool softener if your health care provider approves. Diet  Eat regular, well-balanced meals. Choose a variety of foods, such as meat or vegetable-based protein, fish, milk and low-fat dairy products, vegetables, fruits, and whole grain breads and cereals. Your health care provider will help you determine the amount of weight gain that is right for you.  Avoid raw meat and uncooked cheese. These carry germs that can cause birth defects in the baby.  Eating four or five small meals rather than three large meals a day may help relieve nausea and vomiting. If you start to feel nauseous, eating a few soda crackers can be helpful. Drinking liquids between meals instead of during meals also seems to help nausea and vomiting.  If you develop constipation, eat more high-fiber foods, such as fresh vegetables or fruit and whole grains. Drink enough fluids to keep your urine clear or pale yellow. Activity and Exercise  Exercise only as directed by your health care provider. Exercising will help you:  Control your weight.  Stay in shape.  Be prepared for labor and delivery.  Experiencing pain or cramping in the lower abdomen or low back is a good sign that you should stop exercising. Check with your health care provider before continuing normal exercises.  Try to avoid standing for long periods of time. Move your legs often if you must stand in one place for a long time.  Avoid heavy lifting.  Wear low-heeled shoes, and practice good posture.  You may continue to have sex unless your health care provider directs you  otherwise. Relief of Pain or Discomfort  Wear a good support bra for breast tenderness.   Take warm sitz baths to soothe any pain or discomfort caused by hemorrhoids. Use hemorrhoid cream if your health care provider approves.   Rest with your legs elevated if you have leg cramps or low back pain.  If you develop varicose veins in your legs, wear support hose. Elevate your feet for 15 minutes, 3-4 times a day. Limit salt in your diet. Prenatal Care  Schedule your prenatal visits by the twelfth week of pregnancy. They are usually scheduled monthly at first, then more often in the last 2 months before delivery.  Write down your questions. Take them to your prenatal visits.  Keep all your prenatal visits as directed by your health care provider. Safety  Wear your seat belt at all times when driving.  Make a list of emergency phone numbers, including numbers for family, friends, the hospital, and police and fire departments. General   Tips  Ask your health care provider for a referral to a local prenatal education class. Begin classes no later than at the beginning of month 6 of your pregnancy.  Ask for help if you have counseling or nutritional needs during pregnancy. Your health care provider can offer advice or refer you to specialists for help with various needs.  Do not use hot tubs, steam rooms, or saunas.  Do not douche or use tampons or scented sanitary pads.  Do not cross your legs for long periods of time.  Avoid cat litter boxes and soil used by cats. These carry germs that can cause birth defects in the baby and possibly loss of the fetus by miscarriage or stillbirth.  Avoid all smoking, herbs, alcohol, and medicines not prescribed by your health care provider. Chemicals in these affect the formation and growth of the baby.  Schedule a dentist appointment. At home, brush your teeth with a soft toothbrush and be gentle when you floss. SEEK MEDICAL CARE IF:   You have  dizziness.  You have mild pelvic cramps, pelvic pressure, or nagging pain in the abdominal area.  You have persistent nausea, vomiting, or diarrhea.  You have a bad smelling vaginal discharge.  You have pain with urination.  You notice increased swelling in your face, hands, legs, or ankles. SEEK IMMEDIATE MEDICAL CARE IF:   You have a fever.  You are leaking fluid from your vagina.  You have spotting or bleeding from your vagina.  You have severe abdominal cramping or pain.  You have rapid weight gain or loss.  You vomit blood or material that looks like coffee grounds.  You are exposed to German measles and have never had them.  You are exposed to fifth disease or chickenpox.  You develop a severe headache.  You have shortness of breath.  You have any kind of trauma, such as from a fall or a car accident. Document Released: 11/01/2001 Document Revised: 03/24/2014 Document Reviewed: 09/17/2013 [employer] Patient Information 2015 [employer]. This information is not intended to replace advice given to you by your health care provider. Make sure you discuss any questions you have with your health care provider.   Nausea & Vomiting  Have saltine crackers or pretzels by your bed and eat a few bites before you raise your head out of bed in the morning  Eat small frequent meals throughout the day instead of large meals  Drink plenty of fluids throughout the day to stay hydrated, just don't drink a lot of fluids with your meals.  This can make your stomach fill up faster making you feel sick  Do not brush your teeth right after you eat  Products with real ginger are good for nausea, like ginger ale and ginger hard candy Make sure it says made with real ginger!  Sucking on sour candy like lemon heads is also good for nausea  If your prenatal vitamins make you nauseated, take them at night so you will sleep through the nausea  Sea Bands  If you feel like you need  medicine for the nausea & vomiting please let us know  If you are unable to keep any fluids or food down please let us know   Constipation  Drink plenty of fluid, preferably water, throughout the day  Eat foods high in fiber such as fruits, vegetables, and grains  Exercise, such as walking, is a good way to keep your bowels regular  Drink warm fluids, especially warm   prune juice, or decaf coffee  Eat a 1/2 cup of real oatmeal (not instant), 1/2 cup applesauce, and 1/2-1 cup warm prune juice every day  If needed, you may take Colace (docusate sodium) stool softener once or twice a day to help keep the stool soft. If you are pregnant, wait until you are out of your first trimester (12-14 weeks of pregnancy)  If you still are having problems with constipation, you may take Miralax once daily as needed to help keep your bowels regular.  If you are pregnant, wait until you are out of your first trimester (12-14 weeks of pregnancy)  Safe Medications in Pregnancy   Acne: Benzoyl Peroxide Salicylic Acid  Backache/Headache: Tylenol: 2 regular strength every 4 hours OR              2 Extra strength every 6 hours  Colds/Coughs/Allergies: Benadryl (alcohol free) 25 mg every 6 hours as needed Breath right strips Claritin Cepacol throat lozenges Chloraseptic throat spray Cold-Eeze- up to three times per day Cough drops, alcohol free Flonase (by prescription only) Guaifenesin Mucinex Robitussin DM (plain only, alcohol free) Saline nasal spray/drops Sudafed (pseudoephedrine) & Actifed ** use only after [redacted] weeks gestation and if you do not have high blood pressure Tylenol Vicks Vaporub Zinc lozenges Zyrtec   Constipation: Colace Ducolax suppositories Fleet enema Glycerin suppositories Metamucil Milk of magnesia Miralax Senokot Smooth move tea  Diarrhea: Kaopectate Imodium A-D  *NO pepto Bismol  Hemorrhoids: Anusol Anusol HC Preparation  H Tucks  Indigestion: Tums Maalox Mylanta Zantac  Pepcid  Insomnia: Benadryl (alcohol free) 25mg every 6 hours as needed Tylenol PM Unisom, no Gelcaps  Leg Cramps: Tums MagGel  Nausea/Vomiting:  Bonine Dramamine Emetrol Ginger extract Sea bands Meclizine  Nausea medication to take during pregnancy:  Unisom (doxylamine succinate 25 mg tablets) Take one tablet daily at bedtime. If symptoms are not adequately controlled, the dose can be increased to a maximum recommended dose of two tablets daily (1/2 tablet in the morning, 1/2 tablet mid-afternoon and one at bedtime). Vitamin B6 100mg tablets. Take one tablet twice a day (up to 200 mg per day).  Skin Rashes: Aveeno products Benadryl cream or 25mg every 6 hours as needed Calamine Lotion 1% cortisone cream  Yeast infection: Gyne-lotrimin 7 Monistat 7   **If taking multiple medications, please check labels to avoid duplicating the same active ingredients **take medication as directed on the label ** Do not exceed 4000 mg of tylenol in 24 hours **Do not take medications that contain aspirin or ibuprofen      

## 2018-11-01 LAB — URINALYSIS, ROUTINE W REFLEX MICROSCOPIC
Bilirubin, UA: NEGATIVE
Glucose, UA: NEGATIVE
KETONES UA: NEGATIVE
Leukocytes, UA: NEGATIVE
Nitrite, UA: NEGATIVE
Protein, UA: NEGATIVE
RBC, UA: NEGATIVE
Specific Gravity, UA: 1.018 (ref 1.005–1.030)
Urobilinogen, Ur: 0.2 mg/dL (ref 0.2–1.0)
pH, UA: 7 (ref 5.0–7.5)

## 2018-11-01 LAB — OBSTETRIC PANEL, INCLUDING HIV
ANTIBODY SCREEN: NEGATIVE
BASOS ABS: 0 10*3/uL (ref 0.0–0.2)
Basos: 0 %
EOS (ABSOLUTE): 0.1 10*3/uL (ref 0.0–0.4)
EOS: 1 %
HEMOGLOBIN: 12.3 g/dL (ref 11.1–15.9)
HIV SCREEN 4TH GENERATION: NONREACTIVE
Hematocrit: 37.9 % (ref 34.0–46.6)
Hepatitis B Surface Ag: NEGATIVE
IMMATURE GRANS (ABS): 0.1 10*3/uL (ref 0.0–0.1)
Immature Granulocytes: 1 %
Lymphocytes Absolute: 1.9 10*3/uL (ref 0.7–3.1)
Lymphs: 19 %
MCH: 28.9 pg (ref 26.6–33.0)
MCHC: 32.5 g/dL (ref 31.5–35.7)
MCV: 89 fL (ref 79–97)
MONOCYTES: 9 %
MONOS ABS: 0.9 10*3/uL (ref 0.1–0.9)
NEUTROS ABS: 6.8 10*3/uL (ref 1.4–7.0)
Neutrophils: 70 %
Platelets: 294 10*3/uL (ref 150–450)
RBC: 4.25 x10E6/uL (ref 3.77–5.28)
RDW: 12.8 % (ref 12.3–15.4)
RH TYPE: POSITIVE
RPR Ser Ql: NONREACTIVE
Rubella Antibodies, IGG: 6.82 index (ref >0.99)
WBC: 9.8 10*3/uL (ref 3.4–10.8)

## 2018-11-01 LAB — PMP SCREEN PROFILE (10S), URINE
Amphetamine Scrn, Ur: NEGATIVE ng/mL
BARBITURATE SCREEN URINE: NEGATIVE ng/mL
BENZODIAZEPINE SCREEN, URINE: NEGATIVE ng/mL
CANNABINOIDS UR QL SCN: NEGATIVE ng/mL
Cocaine (Metab) Scrn, Ur: NEGATIVE ng/mL
Creatinine(Crt), U: 95 mg/dL (ref 20.0–300.0)
METHADONE SCREEN, URINE: NEGATIVE ng/mL
OXYCODONE+OXYMORPHONE UR QL SCN: NEGATIVE ng/mL
Opiate Scrn, Ur: NEGATIVE ng/mL
PH UR, DRUG SCRN: 7 (ref 4.5–8.9)
Phencyclidine Qn, Ur: NEGATIVE ng/mL
Propoxyphene Scrn, Ur: NEGATIVE ng/mL

## 2018-11-01 LAB — SICKLE CELL SCREEN: Sickle Cell Screen: NEGATIVE

## 2018-11-02 LAB — GC/CHLAMYDIA PROBE AMP
CHLAMYDIA, DNA PROBE: NEGATIVE
Neisseria gonorrhoeae by PCR: NEGATIVE

## 2018-11-02 LAB — URINE CULTURE

## 2018-11-07 LAB — CYSTIC FIBROSIS MUTATION 97: Interpretation: NOT DETECTED

## 2018-11-23 ENCOUNTER — Ambulatory Visit: Payer: Self-pay | Admitting: Neurology

## 2018-11-28 ENCOUNTER — Encounter: Payer: Self-pay | Admitting: Advanced Practice Midwife

## 2018-11-28 ENCOUNTER — Ambulatory Visit (INDEPENDENT_AMBULATORY_CARE_PROVIDER_SITE_OTHER): Payer: Medicaid Other

## 2018-11-28 ENCOUNTER — Ambulatory Visit (INDEPENDENT_AMBULATORY_CARE_PROVIDER_SITE_OTHER): Payer: Medicaid Other | Admitting: Advanced Practice Midwife

## 2018-11-28 VITALS — BP 116/75 | HR 86 | Wt 108.3 lb

## 2018-11-28 DIAGNOSIS — Z3A13 13 weeks gestation of pregnancy: Secondary | ICD-10-CM

## 2018-11-28 DIAGNOSIS — Z1379 Encounter for other screening for genetic and chromosomal anomalies: Secondary | ICD-10-CM

## 2018-11-28 DIAGNOSIS — Z331 Pregnant state, incidental: Secondary | ICD-10-CM

## 2018-11-28 DIAGNOSIS — Z1389 Encounter for screening for other disorder: Secondary | ICD-10-CM

## 2018-11-28 DIAGNOSIS — Z3682 Encounter for antenatal screening for nuchal translucency: Secondary | ICD-10-CM

## 2018-11-28 DIAGNOSIS — Z3481 Encounter for supervision of other normal pregnancy, first trimester: Secondary | ICD-10-CM

## 2018-11-28 DIAGNOSIS — Z3402 Encounter for supervision of normal first pregnancy, second trimester: Secondary | ICD-10-CM

## 2018-11-28 LAB — POCT URINALYSIS DIPSTICK OB
Blood, UA: NEGATIVE
Glucose, UA: NEGATIVE
Ketones, UA: NEGATIVE
Leukocytes, UA: NEGATIVE
NITRITE UA: NEGATIVE
POC,PROTEIN,UA: NEGATIVE

## 2018-11-28 NOTE — Patient Instructions (Signed)
Michelle Short, I greatly value your feedback.  If you receive a survey following your visit with [place] today, we appreciate you taking the time to fill it out.  Thanks, Cathie Beams, CNM     Second Trimester of Pregnancy The second trimester is from week 14 through week 27 (months 4 through 6). The second trimester is often a time when you feel your best. Your body has adjusted to being pregnant, and you begin to feel better physically. Usually, morning sickness has lessened or quit completely, you may have more energy, and you may have an increase in appetite. The second trimester is also a time when the fetus is growing rapidly. At the end of the sixth month, the fetus is about 9 inches long and weighs about 1 pounds. You will likely begin to feel the baby move (quickening) between 16 and 20 weeks of pregnancy. Body changes during your second trimester Your body continues to go through many changes during your second trimester. The changes vary from woman to woman.  Your weight will continue to increase. You will notice your lower abdomen bulging out.  You may begin to get stretch marks on your hips, abdomen, and breasts.  You may develop headaches that can be relieved by medicines. The medicines should be approved by your health care provider.  You may urinate more often because the fetus is pressing on your bladder.  You may develop or continue to have heartburn as a result of your pregnancy.  You may develop constipation because certain hormones are causing the muscles that push waste through your intestines to slow down.  You may develop hemorrhoids or swollen, bulging veins (varicose veins).  You may have back pain. This is caused by: ? Weight gain. ? Pregnancy hormones that are relaxing the joints in your pelvis. ? A shift in weight and the muscles that support your balance.  Your breasts will continue to grow and they will continue to become tender.  Your gums may  bleed and may be sensitive to brushing and flossing.  Dark spots or blotches (chloasma, mask of pregnancy) may develop on your face. This will likely fade after the baby is born.  A dark line from your belly button to the pubic area (linea nigra) may appear. This will likely fade after the baby is born.  You may have changes in your hair. These can include thickening of your hair, rapid growth, and changes in texture. Some women also have hair loss during or after pregnancy, or hair that feels dry or thin. Your hair will most likely return to normal after your baby is born.  What to expect at prenatal visits During a routine prenatal visit:  You will be weighed to make sure you and the fetus are growing normally.  Your blood pressure will be taken.  Your abdomen will be measured to track your baby's growth.  The fetal heartbeat will be listened to.  Any test results from the previous visit will be discussed.  Your health care provider may ask you:  How you are feeling.  If you are feeling the baby move.  If you have had any abnormal symptoms, such as leaking fluid, bleeding, severe headaches, or abdominal cramping.  If you are using any tobacco products, including cigarettes, chewing tobacco, and electronic cigarettes.  If you have any questions.  Other tests that may be performed during your second trimester include:  Blood tests that check for: ? Low iron levels (anemia). ?  High blood sugar that affects pregnant women (gestational diabetes) between 20 and 28 weeks. ? Rh antibodies. This is to check for a protein on red blood cells (Rh factor).  Urine tests to check for infections, diabetes, or protein in the urine.  An ultrasound to confirm the proper growth and development of the baby.  An amniocentesis to check for possible genetic problems.  Fetal screens for spina bifida and Down syndrome.  HIV (human immunodeficiency virus) testing. Routine prenatal testing  includes screening for HIV, unless you choose not to have this test.  Follow these instructions at home: Medicines  Follow your health care provider's instructions regarding medicine use. Specific medicines may be either safe or unsafe to take during pregnancy.  Take a prenatal vitamin that contains at least 600 micrograms (mcg) of folic acid.  If you develop constipation, try taking a stool softener if your health care provider approves. Eating and drinking  Eat a balanced diet that includes fresh fruits and vegetables, whole grains, good sources of protein such as meat, eggs, or tofu, and low-fat dairy. Your health care provider will help you determine the amount of weight gain that is right for you.  Avoid raw meat and uncooked cheese. These carry germs that can cause birth defects in the baby.  If you have low calcium intake from food, talk to your health care provider about whether you should take a daily calcium supplement.  Limit foods that are high in fat and processed sugars, such as fried and sweet foods.  To prevent constipation: ? Drink enough fluid to keep your urine clear or pale yellow. ? Eat foods that are high in fiber, such as fresh fruits and vegetables, whole grains, and beans. Activity  Exercise only as directed by your health care provider. Most women can continue their usual exercise routine during pregnancy. Try to exercise for 30 minutes at least 5 days a week. Stop exercising if you experience uterine contractions.  Avoid heavy lifting, wear low heel shoes, and practice good posture.  A sexual relationship may be continued unless your health care provider directs you otherwise. Relieving pain and discomfort  Wear a good support bra to prevent discomfort from breast tenderness.  Take warm sitz baths to soothe any pain or discomfort caused by hemorrhoids. Use hemorrhoid cream if your health care provider approves.  Rest with your legs elevated if you have  leg cramps or low back pain.  If you develop varicose veins, wear support hose. Elevate your feet for 15 minutes, 3-4 times a day. Limit salt in your diet. Prenatal Care  Write down your questions. Take them to your prenatal visits.  Keep all your prenatal visits as told by your health care provider. This is important. Safety  Wear your seat belt at all times when driving.  Make a list of emergency phone numbers, including numbers for family, friends, the hospital, and police and fire departments. General instructions  Ask your health care provider for a referral to a local prenatal education class. Begin classes no later than the beginning of month 6 of your pregnancy.  Ask for help if you have counseling or nutritional needs during pregnancy. Your health care provider can offer advice or refer you to specialists for help with various needs.  Do not use hot tubs, steam rooms, or saunas.  Do not douche or use tampons or scented sanitary pads.  Do not cross your legs for long periods of time.  Avoid cat litter boxes and  soil used by cats. These carry germs that can cause birth defects in the baby and possibly loss of the fetus by miscarriage or stillbirth.  Avoid all smoking, herbs, alcohol, and unprescribed drugs. Chemicals in these products can affect the formation and growth of the baby.  Do not use any products that contain nicotine or tobacco, such as cigarettes and e-cigarettes. If you need help quitting, ask your health care provider.  Visit your dentist if you have not gone yet during your pregnancy. Use a soft toothbrush to brush your teeth and be gentle when you floss. Contact a health care provider if:  You have dizziness.  You have mild pelvic cramps, pelvic pressure, or nagging pain in the abdominal area.  You have persistent nausea, vomiting, or diarrhea.  You have a bad smelling vaginal discharge.  You have pain when you urinate. Get help right away if:  You  have a fever.  You are leaking fluid from your vagina.  You have spotting or bleeding from your vagina.  You have severe abdominal cramping or pain.  You have rapid weight gain or weight loss.  You have shortness of breath with chest pain.  You notice sudden or extreme swelling of your face, hands, ankles, feet, or legs.  You have not felt your baby move in over an hour.  You have severe headaches that do not go away when you take medicine.  You have vision changes. Summary  The second trimester is from week 14 through week 27 (months 4 through 6). It is also a time when the fetus is growing rapidly.  Your body goes through many changes during pregnancy. The changes vary from woman to woman.  Avoid all smoking, herbs, alcohol, and unprescribed drugs. These chemicals affect the formation and growth your baby.  Do not use any tobacco products, such as cigarettes, chewing tobacco, and e-cigarettes. If you need help quitting, ask your health care provider.  Contact your health care provider if you have any questions. Keep all prenatal visits as told by your health care provider. This is important. This information is not intended to replace advice given to you by your health care provider. Make sure you discuss any questions you have with your health care provider.      CHILDBIRTH CLASSES [phone] is the phone number for Pregnancy Classes or hospital tours at [place] will be referred to  [email or web address] for more information on childbirth classes  At this site you may register for classes. You may sign up for a waiting list if classes are full. Please SIGN UP FOR THIS!.   When the waiting list becomes long, sometimes new classes can be added.

## 2018-11-28 NOTE — Progress Notes (Signed)
[place] 13+3 wks,measurements c/w dates,normal ovaries bilat,crl 79.93 mm,fhr 162 bpm,anterior placenta gr 0,NB present,NT 1.7 mm

## 2018-11-28 NOTE — Progress Notes (Signed)
  G2P0010 [redacted]w[redacted]d Estimated Date of Delivery: 06/02/19  Blood pressure 116/75, pulse 86, weight 108 lb 4.8 oz (49.1 kg), last menstrual period 08/26/2018, unknown if currently breastfeeding.   BP weight and urine results all reviewed and noted.  Please refer to the obstetrical flow sheet for the fundal height and fetal heart rate documentation:  Patient denies any bleeding and no rupture of membranes symptoms or regular contractions. Patient and her mom have both noticed that since hitting her head (which caused seizures, as well) she has had anger issues and overreacts to life's stresses.  Wants to work on this before baby is born and hopefully develop better coping mechanisms.  Therapy referral recommended and accepted All questions were answered.   Physical Assessment:   Vitals:   11/28/18 1557  BP: 116/75  Pulse: 86  Weight: 108 lb 4.8 oz (49.1 kg)  Body mass index is 18.59 kg/m.        Physical Examination:   General appearance: Well appearing, and in no distress  Mental status: Alert, oriented to person, place, and time  Skin: Warm & dry  Cardiovascular: Normal heart rate noted  Respiratory: Normal respiratory effort, no distress  Abdomen: Soft, gravid, nontender  Pelvic: Cervical exam deferred         Extremities: Edema: None  Fetal Status: Fetal Heart Rate (bpm): 162us       [place] 13+3 wks,measurements c/w dates,normal ovaries bilat,crl 79.93 mm,fhr 162 bpm,anterior placenta gr 0,NB present,NT 1.7 mm  Results for orders placed or performed in visit on 11/28/18 (from the past 24 hour(s))  POC Urinalysis Dipstick OB   Collection Time: 11/28/18  3:58 PM  Result Value Ref Range   Color, UA     Clarity, UA     Glucose, UA Negative Negative   Bilirubin, UA     Ketones, UA neg    Spec Grav, UA     Blood, UA neg    pH, UA     POC,PROTEIN,UA Negative Negative, Trace, Small (1+), Moderate (2+), Large (3+), 4+   Urobilinogen, UA     Nitrite, UA neg    Leukocytes, UA Negative  Negative   Appearance     Odor       Orders Placed This Encounter  Procedures  . Integrated 1  . POC Urinalysis Dipstick OB    Plan:  Continued routine obstetrical care, refer to Nyra Jabs sent  Please call pt at [phone] to schedule and appt.    Return in about 4 weeks (around 12/26/2018) for 2nd IT, LROB.

## 2018-12-02 LAB — INTEGRATED 1
Crown Rump Length: 79.9 mm
Gest. Age on Collection Date: 13.7 weeks
Maternal Age at EDD: 26.9 yr
NUMBER OF FETUSES: 1
Nuchal Translucency (NT): 1.7 mm
PAPP-A Value: 6880.5 ng/mL
Weight: 108 [lb_av]

## 2018-12-11 ENCOUNTER — Encounter: Payer: Self-pay | Admitting: Neurology

## 2018-12-12 DIAGNOSIS — Z0271 Encounter for disability determination: Secondary | ICD-10-CM

## 2018-12-14 ENCOUNTER — Other Ambulatory Visit: Payer: Self-pay | Admitting: Obstetrics and Gynecology

## 2018-12-14 ENCOUNTER — Encounter (HOSPITAL_COMMUNITY): Payer: Self-pay

## 2018-12-14 ENCOUNTER — Other Ambulatory Visit: Payer: Self-pay

## 2018-12-14 ENCOUNTER — Emergency Department (HOSPITAL_COMMUNITY)
Admission: EM | Admit: 2018-12-14 | Discharge: 2018-12-14 | Disposition: A | Discharge status: Home / Self Care | Payer: Medicaid Other | Attending: Emergency Medicine | Admitting: Emergency Medicine

## 2018-12-14 ENCOUNTER — Emergency Department (HOSPITAL_COMMUNITY): Payer: Medicaid Other

## 2018-12-14 DIAGNOSIS — O4690 Antepartum hemorrhage, unspecified, unspecified trimester: Secondary | ICD-10-CM

## 2018-12-14 DIAGNOSIS — Z3A15 15 weeks gestation of pregnancy: Secondary | ICD-10-CM | POA: Diagnosis not present

## 2018-12-14 DIAGNOSIS — Z79899 Other long term (current) drug therapy: Secondary | ICD-10-CM | POA: Insufficient documentation

## 2018-12-14 DIAGNOSIS — O208 Other hemorrhage in early pregnancy: Secondary | ICD-10-CM | POA: Diagnosis not present

## 2018-12-14 DIAGNOSIS — O209 Hemorrhage in early pregnancy, unspecified: Secondary | ICD-10-CM | POA: Diagnosis not present

## 2018-12-14 DIAGNOSIS — Z3A16 16 weeks gestation of pregnancy: Secondary | ICD-10-CM | POA: Diagnosis not present

## 2018-12-14 LAB — URINALYSIS, ROUTINE W REFLEX MICROSCOPIC
Bilirubin Urine: NEGATIVE
Glucose, UA: NEGATIVE mg/dL
Ketones, ur: NEGATIVE mg/dL
Leukocytes, UA: NEGATIVE
Nitrite: NEGATIVE
Protein, ur: NEGATIVE mg/dL
Specific Gravity, Urine: 1.002 — ABNORMAL LOW (ref 1.005–1.030)
pH: 6 (ref 5.0–8.0)

## 2018-12-14 NOTE — Discharge Instructions (Addendum)
Pelvic rest. No sexual activity, avoid constipation, avoid high-impact activities such as jogging.  Expect light intermittent bleeding over the next couple of weeks.  Notify your OB/GYN office for follow-up.  Take your usual medications as previously directed.  Call your regular OB/GYN doctor on Monday to confirm your previously scheduled follow up appointment on 12/26/2018. Call your OB/GYN doctor sooner if your bleeding increases or cramping develops. Return to the Emergency Department immediately sooner if worsening.

## 2018-12-14 NOTE — ED Triage Notes (Signed)
Pt reports she is [redacted] weeks pregnant and passed a moderate amount of dark colored blood in toilet. Pt reports hx of miscarriage

## 2018-12-14 NOTE — ED Provider Notes (Signed)
[facility] EMERGENCY DEPARTMENT Provider Note   CSN: [id number] Arrival date & time: 12/14/18  1629     History   Chief Complaint Chief Complaint  Patient presents with  . Vaginal Bleeding    HPI Michelle Short is a 28 y.o. female.  HPI Pt was seen at 1925. Per pt, c/o gradual onset and resolution of one episode of "vaginal bleeding" that occurred PTA. Pt describes the blood as "dark colored in the toilet."  Hx G1 P0010, EDC 06/02/2019 with EGA [redacted] weeks 5/7 days.  Denies pelvic pain, no pelvic cramping, no passage of tissue, no vaginal discharge, no back pain, no dysuria/hematuria, no flank pain, no N/V/D, no fevers, no rash.    Past Medical History:  Diagnosis Date  . Common migraine with intractable migraine 09/19/2018  . Headache   . Marijuana abuse   . Seizures (HCC) 12/2017    Patient Active Problem List   Diagnosis Date Noted  . Supervision of normal pregnancy 10/31/2018  . Common migraine with intractable migraine 09/19/2018  . Cervical cancer screening 04/17/2018  . Seizure (HCC) 01/11/2018  . Marijuana abuse     History reviewed. No pertinent surgical history.   OB History    Gravida  2   Para      Term      Preterm      AB  1   Living        SAB  1   TAB      Ectopic      Multiple      Live Births               Home Medications    Prior to Admission medications   Medication Sig Start Date End Date Taking? Authorizing Provider  levETIRAcetam (KEPPRA) 500 MG tablet Take 250 mg by mouth 2 (two) times daily.     [provider]  Prenatal Vit-Fe Fumarate-FA (MULTIVITAMIN-PRENATAL) 27-0.8 MG TABS tablet Take 1 tablet by mouth daily at 12 noon.    [provider]  progesterone (PROMETRIUM) 200 MG capsule Place 1 capsule (200 mg total) vaginally at bedtime. Patient not taking: Reported on 11/28/2018 10/09/18   Cheral Marker, CNM  promethazine (PHENERGAN) 25 MG suppository Place 1 suppository (25 mg total)  rectally every 6 (six) hours as needed for nausea or vomiting. Patient not taking: Reported on 10/31/2018 10/13/18   Terrilee Files, MD  promethazine (PHENERGAN) 25 MG tablet Take 1 tablet (25 mg total) by mouth every 6 (six) hours as needed for nausea or vomiting. 10/13/18   Terrilee Files, MD    Family History Family History  Problem Relation Age of Onset  . Hyperlipidemia Mother   . Diabetes Paternal Grandfather   . Cancer Maternal Grandmother        colon cancer  . Heart attack Maternal Grandfather     Social History Social History   Tobacco Use  . Smoking status: Never Smoker  . Smokeless tobacco: Never Used  Substance Use Topics  . Alcohol use: No  . Drug use: Not Currently    Types: Marijuana     Allergies   Ibuprofen and Mosquito (diagnostic)   Review of Systems Review of Systems ROS: Statement: All systems negative except as marked or noted in the HPI; Constitutional: Negative for fever and chills. ; ; Eyes: Negative for eye pain, redness and discharge. ; ; ENMT: Negative for ear pain, hoarseness, nasal congestion, sinus pressure and sore throat. ; ;  Cardiovascular: Negative for chest pain, palpitations, diaphoresis, dyspnea and peripheral edema. ; ; Respiratory: Negative for cough, wheezing and stridor. ; ; Gastrointestinal: Negative for nausea, vomiting, diarrhea, abdominal pain, blood in stool, hematemesis, jaundice and rectal bleeding. . ; ; Genitourinary: Negative for dysuria, flank pain and hematuria. ; ; GYN:  No pelvic pain, +vaginal bleeding, no vaginal discharge, no vulvar pain. ;; Musculoskeletal: Negative for back pain and neck pain. Negative for swelling and trauma.; ; Skin: Negative for pruritus, rash, abrasions, blisters, bruising and skin lesion.; ; Neuro: Negative for headache, lightheadedness and neck stiffness. Negative for weakness, altered level of consciousness, altered mental status, extremity weakness, paresthesias, involuntary movement,  seizure and syncope.      Physical Exam Updated Vital Signs BP 107/61 (BP Location: Left Arm)   Pulse 94   Temp 98.4 F (36.9 C) (Oral)   Resp 20   Ht 5\' 4"  (1.626 m)   Wt 49 kg   LMP 08/26/2018 (Approximate)   SpO2 100%   BMI 18.54 kg/m   Physical Exam 1930: Physical examination:  Nursing notes reviewed; Vital signs and O2 SAT reviewed;  Constitutional: Well developed, Well nourished, Well hydrated, In no acute distress; Head:  Normocephalic, atraumatic; Eyes: EOMI, PERRL, No scleral icterus; ENMT: Mouth and pharynx normal, Mucous membranes moist; Neck: Supple, Full range of motion; Cardiovascular: Regular rate and rhythm; Respiratory: Breath sounds clear, No wheezes.  Speaking full sentences with ease, Normal respiratory effort/excursion; Chest: No deformity, Movement normal; Abdomen: +gravid. NT, Nondistended; Extremities: No deformity.; Neuro: AA&Ox3, Major CN grossly intact.  Speech clear. No gross focal motor deficits in extremities. Climbs on and off stretcher easily by herself. Gait steady.; Skin: Color normal, Warm, Dry.    ED Treatments / Results  Labs (all labs ordered are listed, but only abnormal results are displayed)   EKG None  Radiology   Procedures Procedures (including critical care time)  Medications Ordered in ED Medications - No data to display   Initial Impression / Assessment and Plan / ED Course  I have reviewed the triage vital signs and the nursing notes.  Pertinent labs & imaging results that were available during my care of the patient were reviewed by me and considered in my medical decision making (see chart for details).  MDM Reviewed: previous chart, nursing note and vitals Interpretation: labs and ultrasound   Results for orders placed or performed during the hospital encounter of 12/14/18  Urinalysis, Routine w reflex microscopic  Result Value Ref Range   Color, Urine STRAW (A) YELLOW   APPearance CLEAR CLEAR   Specific  Gravity, Urine 1.002 (L) 1.005 - 1.030   pH 6.0 5.0 - 8.0   Glucose, UA NEGATIVE NEGATIVE mg/dL   Hgb urine dipstick LARGE (A) NEGATIVE   Bilirubin Urine NEGATIVE NEGATIVE   Ketones, ur NEGATIVE NEGATIVE mg/dL   Protein, ur NEGATIVE NEGATIVE mg/dL   Nitrite NEGATIVE NEGATIVE   Leukocytes, UA NEGATIVE NEGATIVE   RBC / HPF 0-5 0 - 5 RBC/hpf   WBC, UA 0-5 0 - 5 WBC/hpf   Bacteria, UA RARE (A) NONE SEEN   Squamous Epithelial / LPF 0-5 0 - 5   [place] Ob Comp + 14 Wk Result Date: 12/14/2018 CLINICAL DATA:  Vaginal bleeding EXAM: LIMITED OBSTETRIC ULTRASOUND FINDINGS: Number of Fetuses: 1 Heart Rate:  149 bpm Movement: Present Presentation: Breech Placental Location: Anterior Previa: At least marginal placenta, placental edge encroaches the internal cervical os. Amniotic Fluid (Subjective):  Within normal limits. BPD: 3.49 cm  16 w  5 d MATERNAL FINDINGS: Cervix:  Appears closed. Uterus/Adnexae: The ovaries are nonvisualized. Crescent shaped hypoechoic collection covering the internal os of the cervix along the placental surface, suspect for hematoma. Additional crescent shaped hypoechoic area in the retroplacental space at the fundal area of the placenta, concerning for small retroplacental hemorrhage. IMPRESSION: 1. Single viable intrauterine pregnancy as above. 2. At least marginal placenta with placenta edge encroaching the internal cervical os. 3. Hypoechoic crescent-shaped collections overlying the internal os, deep to the placenta, and at the fundal placenta, concerning for retroplacental hematomas. Patient is at increased risk for placental abruption. Gynecologic consultation is recommended. This exam is performed on an emergent basis and does not comprehensively evaluate fetal size, dating, or anatomy; follow-up complete OB US should be considered if further fetal assessment is warranted. Electronically Signed   By: Jasmine PangKim  Fujinaga M.D.   On: 12/14/2018 18:51    1930:  OB/GYN Dr. Emelda FearFerguson has evaluated  pt in the ED (see consult note): pt OK to d/c home with instructions. Dx and testing d/w pt and family.  Questions answered.  Verb understanding, agreeable to d/c home with outpt f/u.     Final Clinical Impressions(s) / ED Diagnoses   Final diagnoses:  None    ED Discharge Orders    None       Samuel JesterMcManus, Carnesha Maravilla, DO 12/17/18 1349

## 2018-12-14 NOTE — ED Notes (Signed)
Dr Emelda Fear in to assess

## 2018-12-14 NOTE — Consult Note (Signed)
None     Chief Complaint:  Vaginal Bleeding   Michelle Short is  28 y.o. G2P0010 at [redacted]w[redacted]d presents complaining of Vaginal Bleeding .  Ms. Michelle Short has had an uncomplicated pregnancy followed at [name].  She is not been sexually active in several days.  She had some light brownish discharge beginning earlier this morning, and then she passed large amount of dark blood this afternoon not associated with pain.  There is been no sexual activity there is been no trauma there is no unusual physical activity trauma or other except [place] for the bleeding . Transvaginal ultrasound has been obtained and reviewed.  It shows she has an anterior placenta with a marginal placenta previa with some sub-placental bleeding just at the internal os..  The amniotic fluid around the baby appears normal on ultrasound.  The patient denies any gushes of fluid other than the dark blood that came primarily was a single episode she states no contractions are associated with moderate vaginal bleeding, intact membranes, along with Too early for fetal movement. To be noticed.  Obstetrical/Gynecological History: OB History    Gravida  2   Para      Term      Preterm      AB  1   Living        SAB  1   TAB      Ectopic      Multiple      Live Births             Past Medical History: Past Medical History:  Diagnosis Date  . Common migraine with intractable migraine 09/19/2018  . Headache   . Marijuana abuse   . Seizures (HCC) 12/2017    Past Surgical History: History reviewed. No pertinent surgical history.  Family History: Family History  Problem Relation Age of Onset  . Hyperlipidemia Mother   . Diabetes Paternal Grandfather   . Cancer Maternal Grandmother        colon cancer  . Heart attack Maternal Grandfather     Social History: Social History   Tobacco Use  . Smoking status: Never Smoker  . Smokeless tobacco: Never Used  Substance Use Topics  . Alcohol  use: No  . Drug use: Not Currently    Types: Marijuana    Allergies:  Allergies  Allergen Reactions  . Ibuprofen Other (See Comments)    "interferes with my seizure medication"  . Mosquito (Diagnostic)     Meds: (Not in a hospital admission)   Review of Systems   Constitutional: Negative for fever and chills Eyes: Negative for visual disturbances Respiratory: Negative for shortness of breath, dyspnea Cardiovascular: Negative for chest pain or palpitations  Gastrointestinal: Negative for vomiting, diarrhea and constipation Genitourinary: Negative for dysuria and urgency Musculoskeletal: Negative for back pain, joint pain, myalgias.  Normal ROM  Neurological: Negative for dizziness and headaches    Physical Exam  Blood pressure 136/80, pulse 84, temperature 98 F (36.7 C), temperature source Temporal, resp. rate 12, height 5\' 4"  (1.626 m), weight 49 kg, last menstrual period 08/26/2018, SpO2 100 %, unknown if currently breastfeeding. GENERAL: Well-developed, well-nourished female in no acute distress.  LUNGS: Normal respiratory effort HEART: Regular rate and rhythm. ABDOMEN: Soft, nontender, nondistended, gravid.  EXTREMITIES: Nontender, no edema, 2+ distal pulses. DTR's 2+   FHT:  Baseline rate 162 bpm    Labs: Results for orders placed or performed during the hospital encounter of 12/14/18 (from the past  24 hour(s))  Urinalysis, Routine w reflex microscopic   Collection Time: 12/14/18  5:31 PM  Result Value Ref Range   Color, Urine STRAW (A) YELLOW   APPearance CLEAR CLEAR   Specific Gravity, Urine 1.002 (L) 1.005 - 1.030   pH 6.0 5.0 - 8.0   Glucose, UA NEGATIVE NEGATIVE mg/dL   Hgb urine dipstick LARGE (A) NEGATIVE   Bilirubin Urine NEGATIVE NEGATIVE   Ketones, ur NEGATIVE NEGATIVE mg/dL   Protein, ur NEGATIVE NEGATIVE mg/dL   Nitrite NEGATIVE NEGATIVE   Leukocytes, UA NEGATIVE NEGATIVE   RBC / HPF 0-5 0 - 5 RBC/hpf   WBC, UA 0-5 0 - 5 WBC/hpf   Bacteria,  UA RARE (A) NONE SEEN   Squamous Epithelial / LPF 0-5 0 - 5   Imaging Studies:  [place] Ob Comp + 14 Wk  Result Date: 12/14/2018 CLINICAL DATA:  Vaginal bleeding EXAM: LIMITED OBSTETRIC ULTRASOUND FINDINGS: Number of Fetuses: 1 Heart Rate:  149 bpm Movement: Present Presentation: Breech Placental Location: Anterior Previa: At least marginal placenta, placental edge encroaches the internal cervical os. Amniotic Fluid (Subjective):  Within normal limits. BPD: 3.49 cm 16 w  5 d MATERNAL FINDINGS: Cervix:  Appears closed. Uterus/Adnexae: The ovaries are nonvisualized. Crescent shaped hypoechoic collection covering the internal os of the cervix along the placental surface, suspect for hematoma. Additional crescent shaped hypoechoic area in the retroplacental space at the fundal area of the placenta, concerning for small retroplacental hemorrhage. IMPRESSION: 1. Single viable intrauterine pregnancy as above. 2. At least marginal placenta with placenta edge encroaching the internal cervical os. 3. Hypoechoic crescent-shaped collections overlying the internal os, deep to the placenta, and at the fundal placenta, concerning for retroplacental hematomas. Patient is at increased risk for placental abruption. Gynecologic consultation is recommended. This exam is performed on an emergent basis and does not comprehensively evaluate fetal size, dating, or anatomy; follow-up complete OB US should be considered if further fetal assessment is warranted. Electronically Signed   By: Jasmine PangKim  Fujinaga M.D.   On: 12/14/2018 18:51   [place] Fetal Nuchal Translucency Measurement  Result Date: 12/01/2018 NUCHAL TRANSLUCENCY FOR INTEGRATED TESTING Michelle Short is in the office for nuchal translucency sonogram as part of an integrated screen. She is a 28 y.o. year old G2P0010 with Estimated Date of Delivery: 06/02/19 by LMP now at  5217w3d weeks gestation. Thus far the pregnancy has been complicated by marijuana use,seizure. GESTATION: SINGLETON  FETAL ACTIVITY:          Heart rate         162          The fetus is active. AMNIOTIC FLUID: The amniotic fluid volume is  normal visually PLACENTA LOCALIZATION:  anterior GRADE 0 CERVIX: Measures appears closed ADNEXA: The ovaries are normal. GESTATIONAL AGE AND  BIOMETRICS: Gestational criteria: Estimated Date of Delivery: 06/02/19 by LMP now at 3617w3d Previous Scans:1                      CROWN RUMP LENGTH           79.93 mm         13+6 weeks NUCHAL TRANSLUCENCY           1.7 mm         normal                              AVERAGE EGA(BY THIS SCAN):  13+6 weeks The fetal nasal bone is identified.  TECHNICIAN COMMENTS: US 13+3 wks,measurements c/w dates,normal ovaries bilat,crl 79.93 mm,fhr 162 bpm,anterior placenta gr 0,NB present,NT 1.7 mm The patient will have the first blood draw of her integrated screening today and the second draw in approximately 4 weeks. Amber Flora LippsJ Carl 11/28/2018 3:53 PM Clinical Impression and recommendations: I have reviewed the sonogram results above, combined with the patient's current clinical course, below are my impressions and any appropriate recommendations for management based on the sonographic findings. 1.  [id number] Estimated Date of Delivery: 09/06/17 by  LMP and confirmed by today's sonographic dating 2.  Normal fetal sonographic findings, specifically normal nuchal translucency and present fetal nasal bone Additionally the cranium, both choroid plexuses, zygomatic arch, mandible, 4 equal extremities, stomach, bladder and anterior abdomen are all noted. 3.  Normal general sonographic findings Recommend routine prenatal care based on this sonogram or as clinically indicated Tilda BurrowJohn V Cherrie Franca 12/01/2018 8:00 PM                                                   Assessment: Michelle Sheldonmani B Tracey is  28 y.o. G2P0010 at 8777w5d presents with second trimester bleeding due to marginal placenta previa.  Blood type is Rh+.  Plan: 1.  Pelvic rest no sexual activity avoid constipation avoid  high-impact activities such as jogging.  Expect light intermittent bleeding over the next couple of weeks.  Notify our office for follow-up.  Has current follow-up appointment February 4 which will be appropriate interval unless bleeding increases or cramping develops Plan discharge home for follow-up as scheduled [name] Tilda BurrowJohn V Mallery Harshman 1/24/20207:40 PM

## 2018-12-26 ENCOUNTER — Ambulatory Visit (INDEPENDENT_AMBULATORY_CARE_PROVIDER_SITE_OTHER): Payer: Medicaid Other | Admitting: Obstetrics and Gynecology

## 2018-12-26 VITALS — BP 129/84 | HR 76 | Wt 117.0 lb

## 2018-12-26 DIAGNOSIS — Z1379 Encounter for other screening for genetic and chromosomal anomalies: Secondary | ICD-10-CM

## 2018-12-26 DIAGNOSIS — Z3A17 17 weeks gestation of pregnancy: Secondary | ICD-10-CM

## 2018-12-26 DIAGNOSIS — Z1389 Encounter for screening for other disorder: Secondary | ICD-10-CM

## 2018-12-26 DIAGNOSIS — F39 Unspecified mood [affective] disorder: Secondary | ICD-10-CM | POA: Diagnosis not present

## 2018-12-26 DIAGNOSIS — Z331 Pregnant state, incidental: Secondary | ICD-10-CM

## 2018-12-26 LAB — POCT URINALYSIS DIPSTICK OB
GLUCOSE, UA: NEGATIVE
Ketones, UA: NEGATIVE
Leukocytes, UA: NEGATIVE
Nitrite, UA: NEGATIVE
POC,PROTEIN,UA: NEGATIVE
RBC UA: NEGATIVE

## 2018-12-26 NOTE — Progress Notes (Signed)
Patient ID: OK GANO, female   DOB: 1991/09/04, 28 y.o.   MRN: [id number]    LOW-RISK PREGNANCY VISIT Patient name: Michelle Short MRN [id number]  Date of birth: January 01, 1991 Chief Complaint:   Routine Prenatal Visit (2nd IT)  History of Present Illness:   Michelle Short is a 28 y.o. G2P0010 female at [redacted]w[redacted]d with an Estimated Date of Delivery: 06/02/19 being seen today for ongoing management of a low-risk pregnancy. She is starting to feel baby kick since Monday. Today she reports no complaints. Contractions: Not present. Vag. Bleeding: None.  Movement: Present. denies leaking of fluid. Review of Systems:   Pertinent items are noted in HPI Denies abnormal vaginal discharge w/ itching/odor/irritation, headaches, visual changes, shortness of breath, chest pain, abdominal pain, severe nausea/vomiting, or problems with urination or bowel movements unless otherwise stated above. Pertinent History Reviewed:  Reviewed past medical,surgical, social, obstetrical and family history.  Reviewed problem list, medications and allergies. Physical Assessment:   Vitals:   12/26/18 1017  BP: 129/84  Pulse: 76  Weight: 117 lb (53.1 kg)  Body mass index is 20.08 kg/m.        Physical Examination:   General appearance: Well appearing, and in no distress  Mental status: Alert, oriented to person, place, and time  Skin: Warm & dry  Cardiovascular: Normal heart rate noted  Respiratory: Normal respiratory effort, no distress  Abdomen: Soft, gravid, nontender  Pelvic: Cervical exam deferred         Extremities: Edema: None  Fetal Status: Fetal Heart Rate (bpm): 152   Movement: Present    No results found for this or any previous visit (from the past 24 hour(s)).  Assessment & Plan:  1) Low-risk pregnancy G2P0010 at [redacted]w[redacted]d with an Estimated Date of Delivery: 06/02/19   2) Discussion of birthing classes, with partner taking information regarding childbirth classes for their consideration   Meds:  No orders of the defined types were placed in this encounter.  Labs/procedures today: 2nd IT testing  Plan:   1. Continue routine obstetrical care 2. F/u in 1 week for u/s: anatomy then 4 for lrob  Follow-up: Return in about 4 weeks (around 01/23/2019).  Orders Placed This Encounter  Procedures  . INTEGRATED 2  . POC Urinalysis Dipstick OB   By signing my name below, I, Arnette Norris, attest that this documentation has been prepared under the direction and in the presence of Tilda Burrow, MD. Electronically Signed: Arnette Norris Medical Scribe. 12/26/18. 10:33 AM.  I personally performed the services described in this documentation, which was SCRIBED in my presence. The recorded information has been reviewed and considered accurate. It has been edited as necessary during review. Tilda Burrow, MD

## 2018-12-28 LAB — INTEGRATED 2
AFP MoM: 1.58
Alpha-Fetoprotein: 82.1 ng/mL
Crown Rump Length: 79.9 mm
DIA MoM: 0.91
DIA Value: 185.8 pg/mL
Estriol, Unconjugated: 2.26 ng/mL
Gest. Age on Collection Date: 13.7 weeks
Gestational Age: 17.7 weeks
Maternal Age at EDD: 26.9 yr
Nuchal Translucency (NT): 1.7 mm
Nuchal Translucency MoM: 0.9
Number of Fetuses: 1
PAPP-A MoM: 3.2
PAPP-A Value: 6880.5 ng/mL
TEST RESULTS: NEGATIVE
Weight: 108 [lb_av]
Weight: 108 [lb_av]
hCG MoM: 0.9
hCG Value: 27.6 IU/mL
uE3 MoM: 1.69

## 2019-01-01 DIAGNOSIS — F39 Unspecified mood [affective] disorder: Secondary | ICD-10-CM | POA: Diagnosis not present

## 2019-01-02 ENCOUNTER — Other Ambulatory Visit: Payer: Self-pay | Admitting: Obstetrics and Gynecology

## 2019-01-02 DIAGNOSIS — Z363 Encounter for antenatal screening for malformations: Secondary | ICD-10-CM

## 2019-01-03 ENCOUNTER — Ambulatory Visit (INDEPENDENT_AMBULATORY_CARE_PROVIDER_SITE_OTHER): Payer: Medicaid Other

## 2019-01-03 DIAGNOSIS — Z3402 Encounter for supervision of normal first pregnancy, second trimester: Secondary | ICD-10-CM

## 2019-01-03 DIAGNOSIS — Z363 Encounter for antenatal screening for malformations: Secondary | ICD-10-CM

## 2019-01-03 NOTE — Progress Notes (Signed)
[place] 18+4 wks,breech,anterior low lying placenta,tip of placenta to cx is 1 cm,cx 3.4 cm(image 73),small SCH 2.4 x .6 cm,normal ovaries bilat,LVEICF 3 mm,SVP of fluid 4.8 cm,fhr 152 bpm,efw 296 g 91%,anatomy complete

## 2019-01-08 DIAGNOSIS — F39 Unspecified mood [affective] disorder: Secondary | ICD-10-CM | POA: Diagnosis not present

## 2019-01-15 DIAGNOSIS — F39 Unspecified mood [affective] disorder: Secondary | ICD-10-CM | POA: Diagnosis not present

## 2019-01-22 DIAGNOSIS — F39 Unspecified mood [affective] disorder: Secondary | ICD-10-CM | POA: Diagnosis not present

## 2019-01-23 ENCOUNTER — Encounter: Payer: Self-pay | Admitting: Women's Health

## 2019-01-23 ENCOUNTER — Ambulatory Visit (INDEPENDENT_AMBULATORY_CARE_PROVIDER_SITE_OTHER): Payer: Medicaid Other | Admitting: Women's Health

## 2019-01-23 VITALS — BP 129/79 | HR 124 | Wt 121.5 lb

## 2019-01-23 DIAGNOSIS — Z3482 Encounter for supervision of other normal pregnancy, second trimester: Secondary | ICD-10-CM

## 2019-01-23 DIAGNOSIS — O4442 Low lying placenta NOS or without hemorrhage, second trimester: Secondary | ICD-10-CM

## 2019-01-23 DIAGNOSIS — O4440 Low lying placenta NOS or without hemorrhage, unspecified trimester: Secondary | ICD-10-CM

## 2019-01-23 DIAGNOSIS — Z3A21 21 weeks gestation of pregnancy: Secondary | ICD-10-CM

## 2019-01-23 DIAGNOSIS — Z23 Encounter for immunization: Secondary | ICD-10-CM

## 2019-01-23 NOTE — Progress Notes (Signed)
   LOW-RISK PREGNANCY VISIT Patient name: Michelle Short MRN [id number]  Date of birth: 14-Dec-1990 Chief Complaint:   Routine Prenatal Visit  History of Present Illness:   Michelle Short is a 28 y.o. G2P0010 female at [redacted]w[redacted]d with an Estimated Date of Delivery: 06/02/19 being seen today for ongoing management of a low-risk pregnancy.  Today she reports no complaints. Contractions: Not present. Vag. Bleeding: None.  Movement: Present. denies leaking of fluid. Review of Systems:   Pertinent items are noted in HPI Denies abnormal vaginal discharge w/ itching/odor/irritation, headaches, visual changes, shortness of breath, chest pain, abdominal pain, severe nausea/vomiting, or problems with urination or bowel movements unless otherwise stated above. Pertinent History Reviewed:  Reviewed past medical,surgical, social, obstetrical and family history.  Reviewed problem list, medications and allergies. Physical Assessment:   Vitals:   01/23/19 1041  BP: 129/79  Pulse: (!) 124  Weight: 121 lb 8 oz (55.1 kg)  Body mass index is 20.86 kg/m.        Physical Examination:   General appearance: Well appearing, and in no distress  Mental status: Alert, oriented to person, place, and time  Skin: Warm & dry  Cardiovascular: Normal heart rate noted  Respiratory: Normal respiratory effort, no distress  Abdomen: Soft, gravid, nontender  Pelvic: Cervical exam deferred         Extremities: Edema: None  Fetal Status: Fetal Heart Rate (bpm): 142 Fundal Height: 21 cm Movement: Present    No results found for this or any previous visit (from the past 24 hour(s)).  Assessment & Plan:  1) Low-risk pregnancy G2P0010 at [redacted]w[redacted]d with an Estimated Date of Delivery: 06/02/19   2) Epilepsy, on Keppra  3) Low-lying placenta> 1cm from os, pelvic rest, repeat @ 28wks   Meds: No orders of the defined types were placed in this encounter.  Labs/procedures today: flu shot  Plan:  Continue routine obstetrical care    Reviewed: Preterm labor symptoms and general obstetric precautions including but not limited to vaginal bleeding, contractions, leaking of fluid and fetal movement were reviewed in detail with the patient.  All questions were answered  Follow-up: Return in about 4 weeks (around 02/20/2019) for LROB.  No orders of the defined types were placed in this encounter.  Cheral Marker CNM, [facility] 01/23/2019 11:08 AM

## 2019-01-23 NOTE — Patient Instructions (Signed)
Michelle Short, I greatly value your feedback.  If you receive a survey following your visit with [place] today, we appreciate you taking the time to fill it out.  Thanks, [name], CNM, [facility]  [facility] HAS MOVED!!! It is now [facility] ([address] [place], [place] [address]) Entrance located off of [address] Free 24/7 valet parking     Second Trimester of Pregnancy The second trimester is from week 14 through week 27 (months 4 through 6). The second trimester is often a time when you feel your best. Your body has adjusted to being pregnant, and you begin to feel better physically. Usually, morning sickness has lessened or quit completely, you may have more energy, and you may have an increase in appetite. The second trimester is also a time when the fetus is growing rapidly. At the end of the sixth month, the fetus is about 9 inches long and weighs about 1 pounds. You will likely begin to feel the baby move (quickening) between 16 and 20 weeks of pregnancy. Body changes during your second trimester Your body continues to go through many changes during your second trimester. The changes vary from woman to woman.  Your weight will continue to increase. You will notice your lower abdomen bulging out.  You may begin to get stretch marks on your hips, abdomen, and breasts.  You may develop headaches that can be relieved by medicines. The medicines should be approved by your health care provider.  You may urinate more often because the fetus is pressing on your bladder.  You may develop or continue to have heartburn as a result of your pregnancy.  You may develop constipation because certain hormones are causing the muscles that push waste through your intestines to slow down.  You may develop hemorrhoids or swollen, bulging veins (varicose veins).  You may have back pain. This is caused by: ? Weight gain. ? Pregnancy hormones that are relaxing  the joints in your pelvis. ? A shift in weight and the muscles that support your balance.  Your breasts will continue to grow and they will continue to become tender.  Your gums may bleed and may be sensitive to brushing and flossing.  Dark spots or blotches (chloasma, mask of pregnancy) may develop on your face. This will likely fade after the baby is born.  A dark line from your belly button to the pubic area (linea nigra) may appear. This will likely fade after the baby is born.  You may have changes in your hair. These can include thickening of your hair, rapid growth, and changes in texture. Some women also have hair loss during or after pregnancy, or hair that feels dry or thin. Your hair will most likely return to normal after your baby is born.  What to expect at prenatal visits During a routine prenatal visit:  You will be weighed to make sure you and the fetus are growing normally.  Your blood pressure will be taken.  Your abdomen will be measured to track your baby's growth.  The fetal heartbeat will be listened to.  Any test results from the previous visit will be discussed.  Your health care provider may ask you:  How you are feeling.  If you are feeling the baby move.  If you have had any abnormal symptoms, such as leaking fluid, bleeding, severe headaches, or abdominal cramping.  If you are using any tobacco products, including cigarettes, chewing  tobacco, and electronic cigarettes.  If you have any questions.  Other tests that may be performed during your second trimester include:  Blood tests that check for: ? Low iron levels (anemia). ? High blood sugar that affects pregnant women (gestational diabetes) between 28 and 28 weeks. ? Rh antibodies. This is to check for a protein on red blood cells (Rh factor).  Urine tests to check for infections, diabetes, or protein in the urine.  An ultrasound to confirm the proper growth and development of the  baby.  An amniocentesis to check for possible genetic problems.  Fetal screens for spina bifida and Down syndrome.  HIV (human immunodeficiency virus) testing. Routine prenatal testing includes screening for HIV, unless you choose not to have this test.  Follow these instructions at home: Medicines  Follow your health care provider's instructions regarding medicine use. Specific medicines may be either safe or unsafe to take during pregnancy.  Take a prenatal vitamin that contains at least 600 micrograms (mcg) of folic acid.  If you develop constipation, try taking a stool softener if your health care provider approves. Eating and drinking  Eat a balanced diet that includes fresh fruits and vegetables, whole grains, good sources of protein such as meat, eggs, or tofu, and low-fat dairy. Your health care provider will help you determine the amount of weight gain that is right for you.  Avoid raw meat and uncooked cheese. These carry germs that can cause birth defects in the baby.  If you have low calcium intake from food, talk to your health care provider about whether you should take a daily calcium supplement.  Limit foods that are high in fat and processed sugars, such as fried and sweet foods.  To prevent constipation: ? Drink enough fluid to keep your urine clear or pale yellow. ? Eat foods that are high in fiber, such as fresh fruits and vegetables, whole grains, and beans. Activity  Exercise only as directed by your health care provider. Most women can continue their usual exercise routine during pregnancy. Try to exercise for 30 minutes at least 5 days a week. Stop exercising if you experience uterine contractions.  Avoid heavy lifting, wear low heel shoes, and practice good posture.  A sexual relationship may be continued unless your health care provider directs you otherwise. Relieving pain and discomfort  Wear a good support bra to prevent discomfort from breast  tenderness.  Take warm sitz baths to soothe any pain or discomfort caused by hemorrhoids. Use hemorrhoid cream if your health care provider approves.  Rest with your legs elevated if you have leg cramps or low back pain.  If you develop varicose veins, wear support hose. Elevate your feet for 15 minutes, 3-4 times a day. Limit salt in your diet. Prenatal Care  Write down your questions. Take them to your prenatal visits.  Keep all your prenatal visits as told by your health care provider. This is important. Safety  Wear your seat belt at all times when driving.  Make a list of emergency phone numbers, including numbers for family, friends, the hospital, and police and fire departments. General instructions  Ask your health care provider for a referral to a local prenatal education class. Begin classes no later than the beginning of month 6 of your pregnancy.  Ask for help if you have counseling or nutritional needs during pregnancy. Your health care provider can offer advice or refer you to specialists for help with various needs.  Do not use  hot tubs, steam rooms, or saunas.  Do not douche or use tampons or scented sanitary pads.  Do not cross your legs for long periods of time.  Avoid cat litter boxes and soil used by cats. These carry germs that can cause birth defects in the baby and possibly loss of the fetus by miscarriage or stillbirth.  Avoid all smoking, herbs, alcohol, and unprescribed drugs. Chemicals in these products can affect the formation and growth of the baby.  Do not use any products that contain nicotine or tobacco, such as cigarettes and e-cigarettes. If you need help quitting, ask your health care provider.  Visit your dentist if you have not gone yet during your pregnancy. Use a soft toothbrush to brush your teeth and be gentle when you floss. Contact a health care provider if:  You have dizziness.  You have mild pelvic cramps, pelvic pressure, or  nagging pain in the abdominal area.  You have persistent nausea, vomiting, or diarrhea.  You have a bad smelling vaginal discharge.  You have pain when you urinate. Get help right away if:  You have a fever.  You are leaking fluid from your vagina.  You have spotting or bleeding from your vagina.  You have severe abdominal cramping or pain.  You have rapid weight gain or weight loss.  You have shortness of breath with chest pain.  You notice sudden or extreme swelling of your face, hands, ankles, feet, or legs.  You have not felt your baby move in over an hour.  You have severe headaches that do not go away when you take medicine.  You have vision changes. Summary  The second trimester is from week 14 through week 27 (months 4 through 6). It is also a time when the fetus is growing rapidly.  Your body goes through many changes during pregnancy. The changes vary from woman to woman.  Avoid all smoking, herbs, alcohol, and unprescribed drugs. These chemicals affect the formation and growth your baby.  Do not use any tobacco products, such as cigarettes, chewing tobacco, and e-cigarettes. If you need help quitting, ask your health care provider.  Contact your health care provider if you have any questions. Keep all prenatal visits as told by your health care provider. This is important. This information is not intended to replace advice given to you by your health care provider. Make sure you discuss any questions you have with your health care provider. Document Released: 11/01/2001 Document Revised: 04/14/2016 Document Reviewed: 01/08/2013 Elsevier Interactive Patient Education  2017 [employer].

## 2019-01-29 DIAGNOSIS — F39 Unspecified mood [affective] disorder: Secondary | ICD-10-CM | POA: Diagnosis not present

## 2019-02-05 DIAGNOSIS — F39 Unspecified mood [affective] disorder: Secondary | ICD-10-CM | POA: Diagnosis not present

## 2019-02-12 DIAGNOSIS — F39 Unspecified mood [affective] disorder: Secondary | ICD-10-CM | POA: Diagnosis not present

## 2019-02-19 DIAGNOSIS — F39 Unspecified mood [affective] disorder: Secondary | ICD-10-CM | POA: Diagnosis not present

## 2019-02-20 ENCOUNTER — Encounter: Payer: Medicaid Other | Admitting: Obstetrics and Gynecology

## 2019-02-26 ENCOUNTER — Telehealth: Payer: Self-pay | Admitting: *Deleted

## 2019-02-26 DIAGNOSIS — F39 Unspecified mood [affective] disorder: Secondary | ICD-10-CM | POA: Diagnosis not present

## 2019-02-26 NOTE — Telephone Encounter (Signed)
Called pt to reschedule appt.

## 2019-02-27 ENCOUNTER — Other Ambulatory Visit: Payer: Medicaid Other

## 2019-02-27 ENCOUNTER — Encounter: Payer: Medicaid Other | Admitting: Advanced Practice Midwife

## 2019-03-05 DIAGNOSIS — F39 Unspecified mood [affective] disorder: Secondary | ICD-10-CM | POA: Diagnosis not present

## 2019-03-06 ENCOUNTER — Other Ambulatory Visit: Payer: Self-pay | Admitting: Women's Health

## 2019-03-06 ENCOUNTER — Telehealth: Payer: Self-pay | Admitting: *Deleted

## 2019-03-06 DIAGNOSIS — O4440 Low lying placenta NOS or without hemorrhage, unspecified trimester: Secondary | ICD-10-CM

## 2019-03-06 NOTE — Telephone Encounter (Signed)
Left message letting pt know no visitors or children at tomorrow's appt. Also, if pt has come in contact with someone in the last month that has been confirmed or suspected of having Covid-19 or if she is experiencing any symptoms herself of Covid-19, don't show up at office; just call and appt can be rescheduled. JSY

## 2019-03-07 ENCOUNTER — Ambulatory Visit (INDEPENDENT_AMBULATORY_CARE_PROVIDER_SITE_OTHER): Payer: Medicaid Other

## 2019-03-07 ENCOUNTER — Other Ambulatory Visit: Payer: Medicaid Other

## 2019-03-07 ENCOUNTER — Encounter: Payer: Self-pay | Admitting: Obstetrics and Gynecology

## 2019-03-07 ENCOUNTER — Ambulatory Visit (INDEPENDENT_AMBULATORY_CARE_PROVIDER_SITE_OTHER): Payer: Medicaid Other | Admitting: Obstetrics and Gynecology

## 2019-03-07 ENCOUNTER — Other Ambulatory Visit: Payer: Self-pay

## 2019-03-07 VITALS — BP 119/80 | HR 91 | Temp 98.5°F | Wt 133.2 lb

## 2019-03-07 DIAGNOSIS — Z3A27 27 weeks gestation of pregnancy: Secondary | ICD-10-CM | POA: Diagnosis not present

## 2019-03-07 DIAGNOSIS — Z23 Encounter for immunization: Secondary | ICD-10-CM | POA: Diagnosis not present

## 2019-03-07 DIAGNOSIS — Z331 Pregnant state, incidental: Secondary | ICD-10-CM

## 2019-03-07 DIAGNOSIS — Z3402 Encounter for supervision of normal first pregnancy, second trimester: Secondary | ICD-10-CM

## 2019-03-07 DIAGNOSIS — Z362 Encounter for other antenatal screening follow-up: Secondary | ICD-10-CM

## 2019-03-07 DIAGNOSIS — O4442 Low lying placenta NOS or without hemorrhage, second trimester: Secondary | ICD-10-CM

## 2019-03-07 DIAGNOSIS — O4440 Low lying placenta NOS or without hemorrhage, unspecified trimester: Secondary | ICD-10-CM

## 2019-03-07 DIAGNOSIS — Z3A26 26 weeks gestation of pregnancy: Secondary | ICD-10-CM | POA: Diagnosis not present

## 2019-03-07 DIAGNOSIS — Z3482 Encounter for supervision of other normal pregnancy, second trimester: Secondary | ICD-10-CM | POA: Diagnosis not present

## 2019-03-07 DIAGNOSIS — Z1389 Encounter for screening for other disorder: Secondary | ICD-10-CM

## 2019-03-07 LAB — POCT URINALYSIS DIPSTICK OB
Blood, UA: NEGATIVE
Glucose, UA: NEGATIVE
Ketones, UA: NEGATIVE
Leukocytes, UA: NEGATIVE
Nitrite, UA: NEGATIVE
POC,PROTEIN,UA: NEGATIVE

## 2019-03-07 NOTE — Progress Notes (Signed)
[place] 27 wks,cephalic,anterior placenta gr 2,cx 3.7 cm,low lying placenta resolved,tip of placenta to cx 3.7 cm,AFI 12.8 cm,fhr 150 bpm,normal ovaries bilat,efw 1294 g 84%,LVEICF 2.3 mm

## 2019-03-07 NOTE — Progress Notes (Signed)
   LOW-RISK PREGNANCY VISIT Patient name: Michelle Short MRN [id number]  Date of birth: 16-Oct-1991 Chief Complaint:   Routine Prenatal Visit (PN2/ U-S)  History of Present Illness:   Michelle Short is a 28 y.o. G32P0010 female at [redacted]w[redacted]d with an Estimated Date of Delivery: 06/02/19 being seen today for ongoing management of a low-risk pregnancy.  Today she reports no bleeding. Contractions: Not present.  .  Movement: Present. denies leaking of fluid. Review of Systems:   Pertinent items are noted in HPI Denies abnormal vaginal discharge w/ itching/odor/irritation, headaches, visual changes, shortness of breath, chest pain, abdominal pain, severe nausea/vomiting, or problems with urination or bowel movements unless otherwise stated above. Pertinent History Reviewed:  Reviewed past medical,surgical, social, obstetrical and family history.  Reviewed problem list, medications and allergies. Physical Assessment:   Vitals:   03/07/19 1013  BP: 119/80  Pulse: 91  Temp: 98.5 F (36.9 C)  Weight: 133 lb 3.2 oz (60.4 kg)  Body mass index is 22.86 kg/m.        Physical Examination:   General appearance: Well appearing, and in no distress  Mental status: Alert, oriented to person, place, and time  Skin: Warm & dry  Cardiovascular: Normal heart rate noted  Respiratory: Normal respiratory effort, no distress  Abdomen: Soft, gravid, nontender  Pelvic: Cervical exam deferred         Extremities: Edema: None  Fetal Status:     Movement: Present    Results for orders placed or performed in visit on 03/07/19 (from the past 24 hour(s))  POC Urinalysis Dipstick OB   Collection Time: 03/07/19 10:19 AM  Result Value Ref Range   Color, UA     Clarity, UA     Glucose, UA Negative Negative   Bilirubin, UA     Ketones, UA neg    Spec Grav, UA     Blood, UA neg    pH, UA     POC,PROTEIN,UA Negative Negative, Trace, Small (1+), Moderate (2+), Large (3+), 4+   Urobilinogen, UA     Nitrite, UA  neg    Leukocytes, UA Negative Negative   Appearance     Odor      Assessment & Plan:  1) Low-risk pregnancy G2P0010 at [redacted]w[redacted]d with an Estimated Date of Delivery: 06/02/19   2) seizure disorder, on Keppra,  3) anterior low-lying placenta, resolved to 3.7 cm   Meds: No orders of the defined types were placed in this encounter.  Labs/procedures today: Urinalysis, ultrasound  Plan:  Continue routine obstetrical care   Reviewed:  labor symptoms and general obstetric precautions including but not limited to vaginal bleeding, contractions, leaking of fluid and fetal movement were reviewed in detail with the patient.  All questions were answered  Follow-up: 4 weeks  Orders Placed This Encounter  Procedures  . Tdap vaccine greater than or equal to 7yo IM  . POC Urinalysis Dipstick OB   Tilda Burrow MD 03/07/2019 10:34 AM

## 2019-03-08 LAB — RPR: RPR Ser Ql: NONREACTIVE

## 2019-03-08 LAB — CBC
Hematocrit: 32.3 % — ABNORMAL LOW (ref 34.0–46.6)
Hemoglobin: 11.1 g/dL (ref 11.1–15.9)
MCH: 30.2 pg (ref 26.6–33.0)
MCHC: 34.4 g/dL (ref 31.5–35.7)
MCV: 88 fL (ref 79–97)
Platelets: 212 10*3/uL (ref 150–450)
RBC: 3.68 x10E6/uL — ABNORMAL LOW (ref 3.77–5.28)
RDW: 12.1 % (ref 11.7–15.4)
WBC: 8.7 10*3/uL (ref 3.4–10.8)

## 2019-03-08 LAB — GLUCOSE TOLERANCE, 2 HOURS W/ 1HR
Glucose, 1 hour: 92 mg/dL (ref 65–179)
Glucose, 2 hour: 92 mg/dL (ref 65–152)
Glucose, Fasting: 75 mg/dL (ref 65–91)

## 2019-03-08 LAB — HIV ANTIBODY (ROUTINE TESTING W REFLEX): HIV Screen 4th Generation wRfx: NONREACTIVE

## 2019-03-08 LAB — ANTIBODY SCREEN: Antibody Screen: NEGATIVE

## 2019-03-08 IMAGING — CT CT HEAD W/O CM
3 series · 15 of 47 positions shown, 18 images · non-contrast
Comparison: None.

CLINICAL DATA: New seizure.

EXAM:
CT HEAD WITHOUT CONTRAST
TECHNIQUE: Contiguous axial images were obtained from the base of the skull
through the vertex without intravenous contrast.

[Series 3: head 5.0 h30s · axial · 0.44mm/px · z∈[-76,+59]mm · 9 of 33 slices shown, 12 images]
[im 3/33  brain]
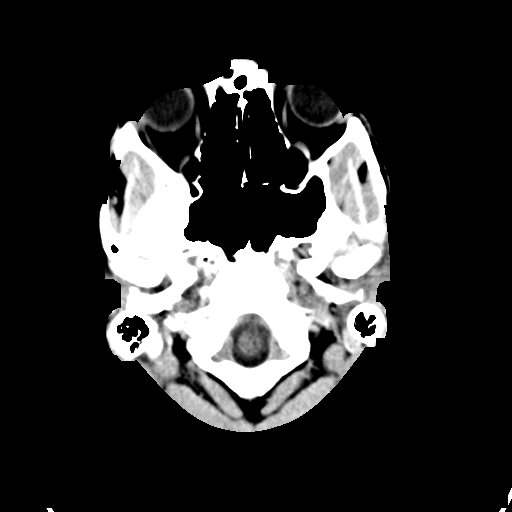
[im 3/33  bone]
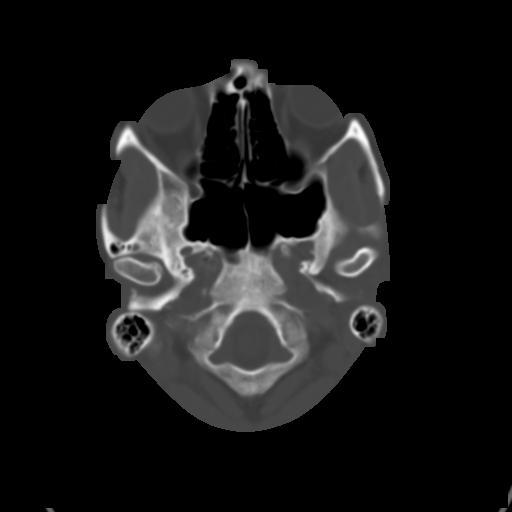
[im 6/33  brain]
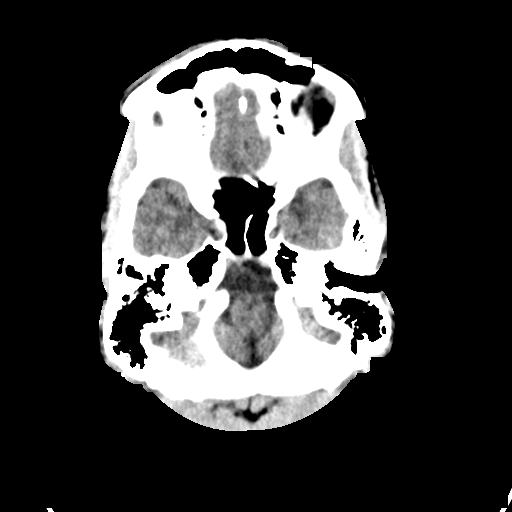
[im 9/33  brain]
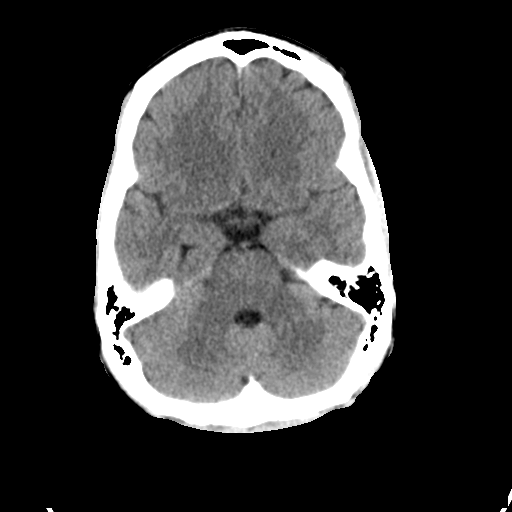
[im 13/33  brain]
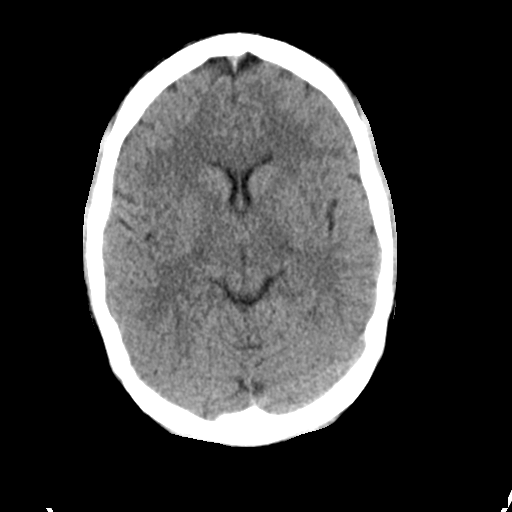
[im 17/33  brain]
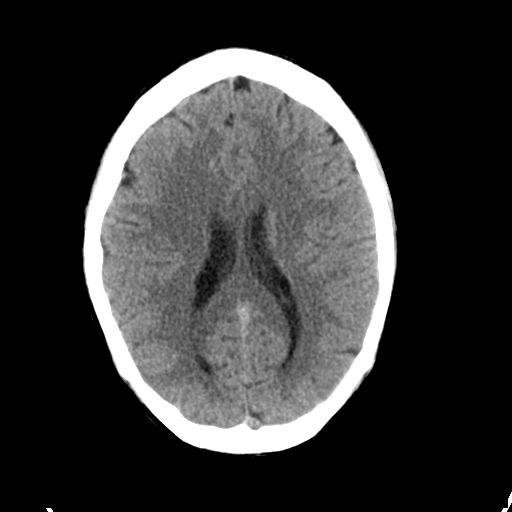
[im 17/33  bone]
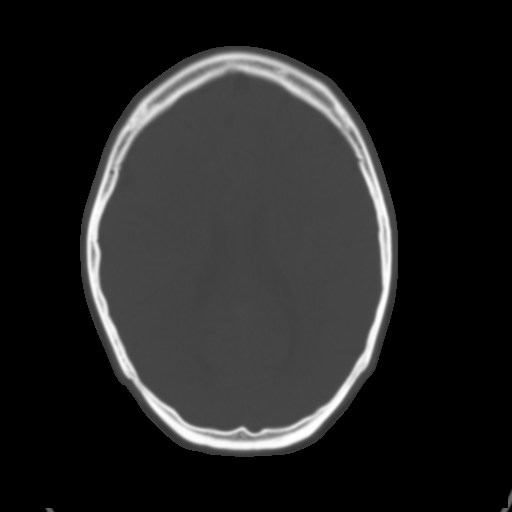
[im 20/33  brain]
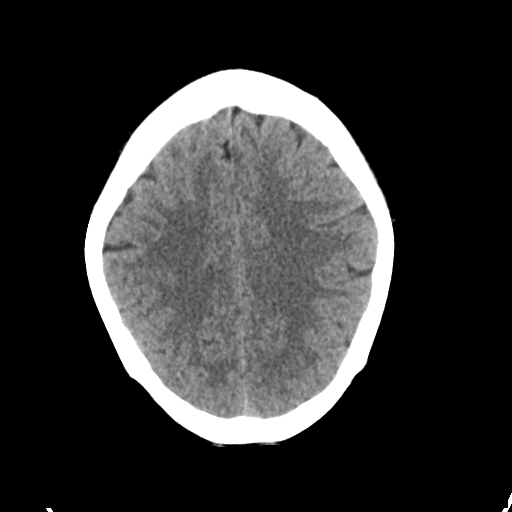
[im 24/33  brain]
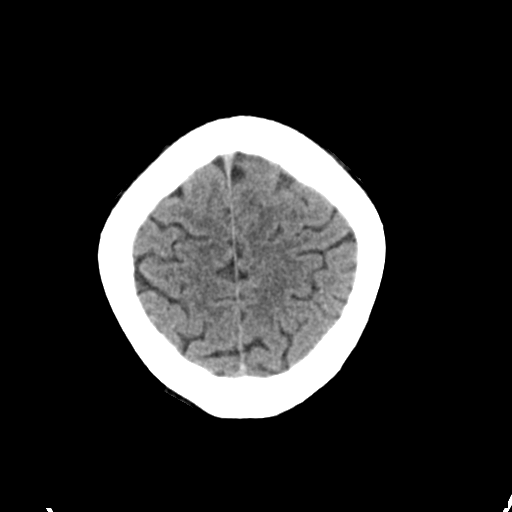
[im 27/33  brain]
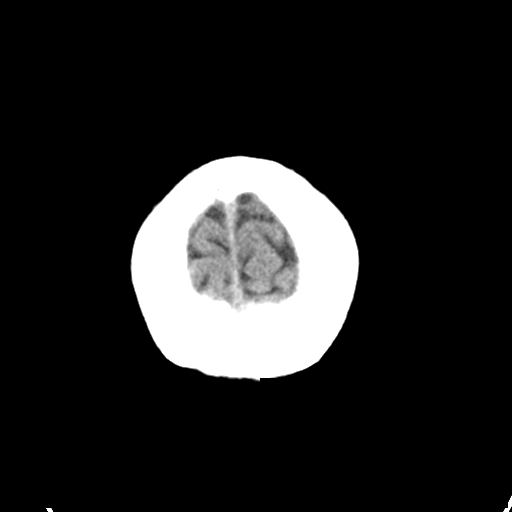
[im 30/33  brain]
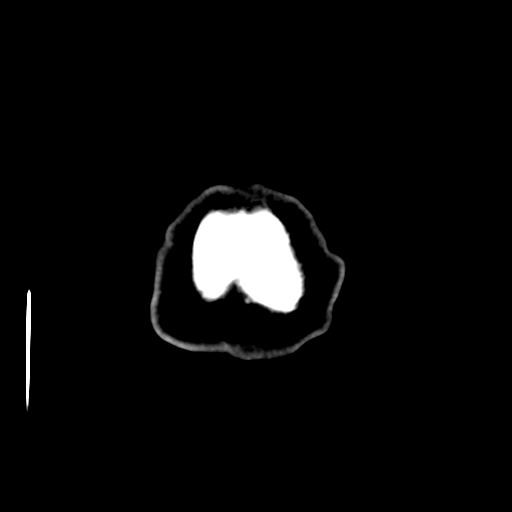
[im 30/33  bone]
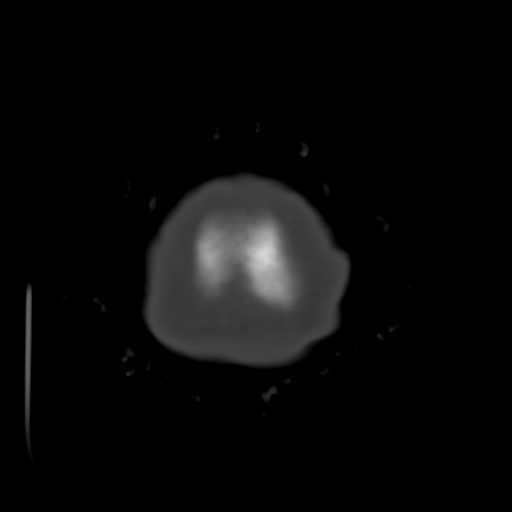

[Series 5: head 3.0 mpr cor · coronal · 0.34mm/px · 3 of 67 slices shown]
[im 23/67  brain]
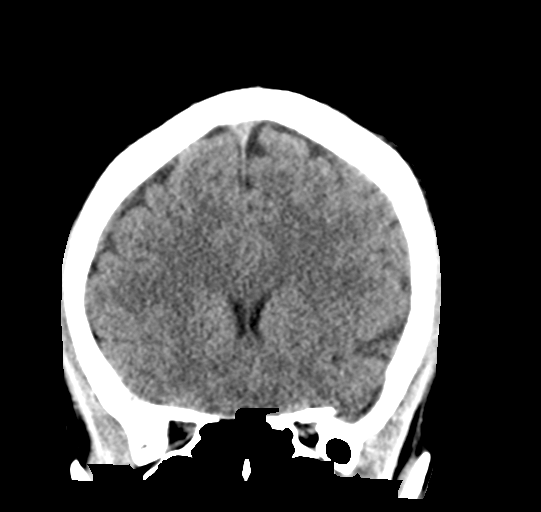
[im 30/67  brain]
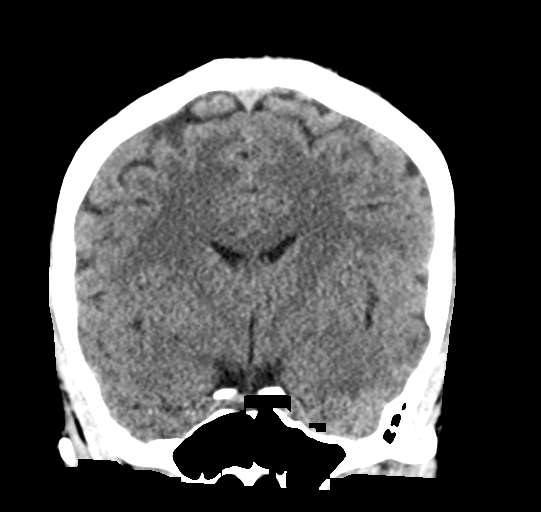
[im 37/67  brain]
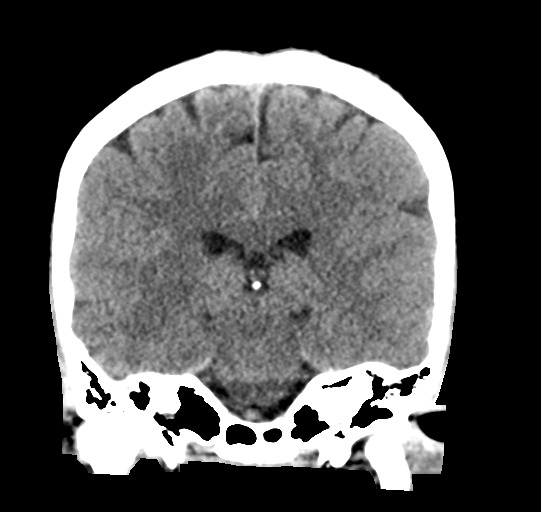

[Series 6: head 3.0 mpr sag · sagittal · 0.33mm/px · 3 of 67 slices shown]
[im 23/67  brain]
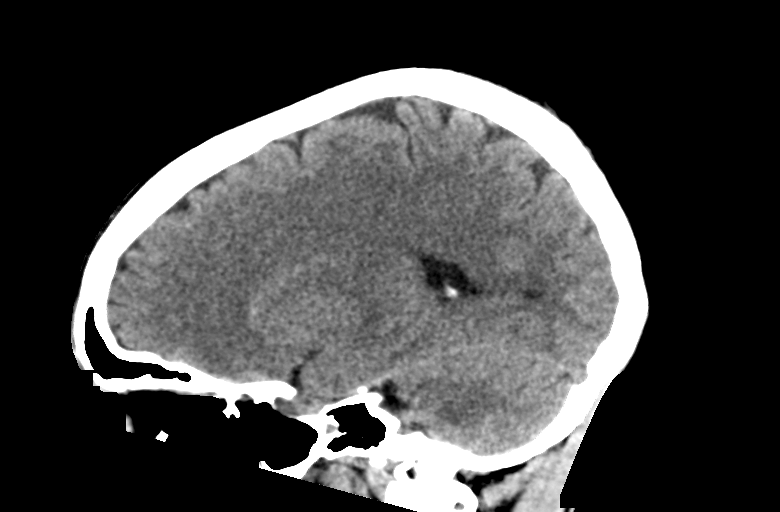
[im 34/67  brain]
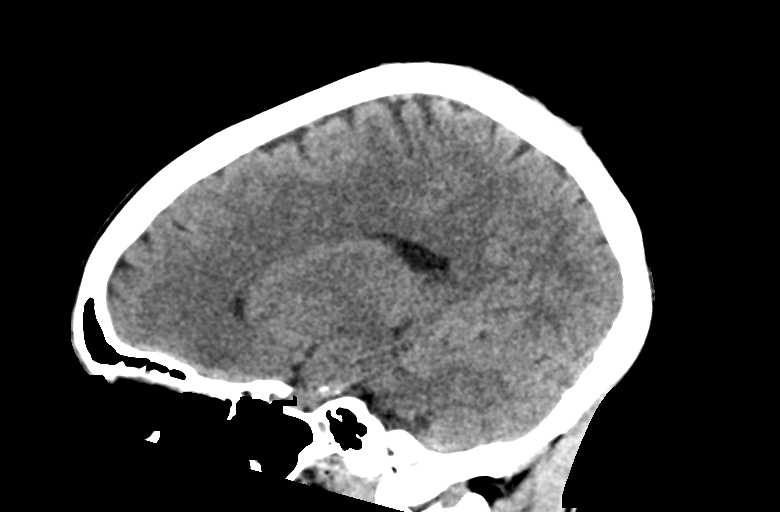
[im 45/67  brain]
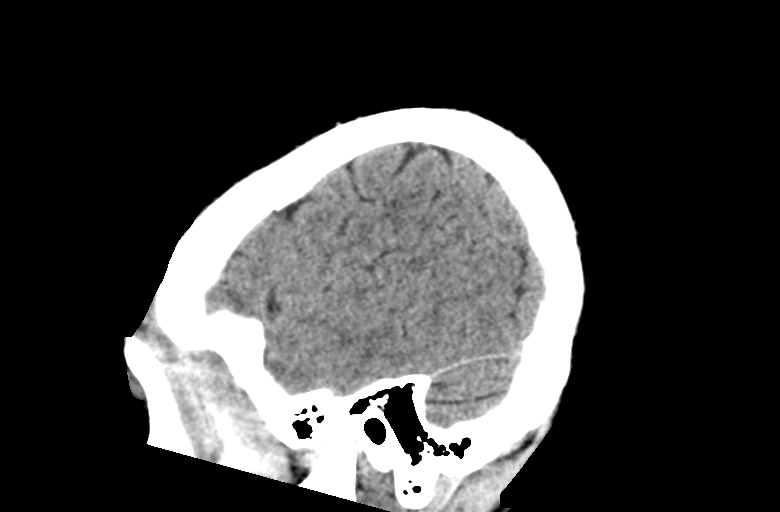

[15 of 47 positions shown; findings below may reference images not displayed]

FINDINGS: Brain: No mass lesion, hemorrhage, hydrocephalus, acute infarct,
intra-axial, or extra-axial fluid collection.

Vascular: No hyperdense vessel or unexpected calcification.

Skull: No significant soft tissue swelling.  Normal skull.

Sinuses/Orbits: Normal imaged portions of the orbits and globes.
Clear paranasal sinuses and mastoid air cells.

Other: None.
IMPRESSION: Normal head CT.  No explanation for seizure.

## 2019-03-12 DIAGNOSIS — F39 Unspecified mood [affective] disorder: Secondary | ICD-10-CM | POA: Diagnosis not present

## 2019-03-26 ENCOUNTER — Ambulatory Visit: Payer: Medicaid Other | Admitting: Neurology

## 2019-03-26 ENCOUNTER — Ambulatory Visit: Payer: Medicaid Other | Admitting: Adult Health

## 2019-03-27 ENCOUNTER — Encounter: Payer: Self-pay | Admitting: *Deleted

## 2019-03-28 ENCOUNTER — Encounter: Payer: Self-pay | Admitting: Obstetrics & Gynecology

## 2019-03-28 ENCOUNTER — Ambulatory Visit (INDEPENDENT_AMBULATORY_CARE_PROVIDER_SITE_OTHER): Payer: Medicaid Other | Admitting: Obstetrics & Gynecology

## 2019-03-28 ENCOUNTER — Other Ambulatory Visit: Payer: Self-pay

## 2019-03-28 VITALS — BP 114/74 | HR 121 | Temp 98.7°F | Wt 136.0 lb

## 2019-03-28 DIAGNOSIS — Z3403 Encounter for supervision of normal first pregnancy, third trimester: Secondary | ICD-10-CM

## 2019-03-28 DIAGNOSIS — Z331 Pregnant state, incidental: Secondary | ICD-10-CM

## 2019-03-28 DIAGNOSIS — Z3A30 30 weeks gestation of pregnancy: Secondary | ICD-10-CM

## 2019-03-28 DIAGNOSIS — Z1389 Encounter for screening for other disorder: Secondary | ICD-10-CM

## 2019-03-28 LAB — POCT URINALYSIS DIPSTICK OB
Blood, UA: NEGATIVE
Glucose, UA: NEGATIVE
Ketones, UA: NEGATIVE
Nitrite, UA: NEGATIVE
POC,PROTEIN,UA: NEGATIVE

## 2019-03-28 NOTE — Progress Notes (Signed)
   LOW-RISK PREGNANCY VISIT Patient name: Michelle Short MRN [id number]  Date of birth: [birth year] Chief Complaint:   Routine Prenatal Visit  History of Present Illness:   Michelle Short is a 28 y.o. G31P0010 female at [redacted]w[redacted]d with an Estimated Date of Delivery: 06/02/19 being seen today for ongoing management of a low-risk pregnancy.  Today she reports no complaints. Contractions: Irregular.  .  Movement: Present. denies leaking of fluid. Review of Systems:   Pertinent items are noted in HPI Denies abnormal vaginal discharge w/ itching/odor/irritation, headaches, visual changes, shortness of breath, chest pain, abdominal pain, severe nausea/vomiting, or problems with urination or bowel movements unless otherwise stated above. Pertinent History Reviewed:  Reviewed past medical,surgical, social, obstetrical and family history.  Reviewed problem list, medications and allergies. Physical Assessment:   Vitals:   03/28/19 1106  BP: 114/74  Pulse: (!) 121  Temp: 98.7 F (37.1 C)  Weight: 136 lb (61.7 kg)  Body mass index is 23.34 kg/m.        Physical Examination:   General appearance: Well appearing, and in no distress  Mental status: Alert, oriented to person, place, and time  Skin: Warm & dry  Cardiovascular: Normal heart rate noted  Respiratory: Normal respiratory effort, no distress  Abdomen: Soft, gravid, nontender  Pelvic: Cervical exam deferred         Extremities: Edema: None  Fetal Status:     Movement: Present    Results for orders placed or performed in visit on 03/28/19 (from the past 24 hour(s))  POC Urinalysis Dipstick OB   Collection Time: 03/28/19 11:20 AM  Result Value Ref Range   Color, UA     Clarity, UA     Glucose, UA Negative Negative   Bilirubin, UA     Ketones, UA neg    Spec Grav, UA     Blood, UA neg    pH, UA     POC,PROTEIN,UA Negative Negative, Trace, Small (1+), Moderate (2+), Large (3+), 4+   Urobilinogen, UA     Nitrite, UA neg    Leukocytes, UA Moderate (2+) (A) Negative   Appearance     Odor      Assessment & Plan:  1) Low-risk pregnancy G2P0010 at [redacted]w[redacted]d with an Estimated Date of Delivery: 06/02/19   2) Seizure disorder, on Keppra, last seizure December, pt was missing doses   Meds: No orders of the defined types were placed in this encounter.  Labs/procedures today:   Plan:  Continue routine obstetrical care   Reviewed: Preterm labor symptoms and general obstetric precautions including but not limited to vaginal bleeding, contractions, leaking of fluid and fetal movement were reviewed in detail with the patient.  All questions were answered  Follow-up: Return in about 3 weeks (around 04/18/2019) for webex ob visit.  Orders Placed This Encounter  Procedures  . POC Urinalysis Dipstick OB   Lazaro Arms  03/28/2019 11:47 AM

## 2019-04-17 ENCOUNTER — Encounter: Payer: Self-pay | Admitting: *Deleted

## 2019-04-18 ENCOUNTER — Ambulatory Visit (INDEPENDENT_AMBULATORY_CARE_PROVIDER_SITE_OTHER): Payer: Medicaid Other | Admitting: Women's Health

## 2019-04-18 ENCOUNTER — Encounter: Payer: Self-pay | Admitting: Women's Health

## 2019-04-18 ENCOUNTER — Other Ambulatory Visit: Payer: Self-pay

## 2019-04-18 VITALS — BP 123/78 | HR 106 | Wt 142.0 lb

## 2019-04-18 DIAGNOSIS — Z3483 Encounter for supervision of other normal pregnancy, third trimester: Secondary | ICD-10-CM

## 2019-04-18 DIAGNOSIS — Z3A33 33 weeks gestation of pregnancy: Secondary | ICD-10-CM

## 2019-04-18 NOTE — Patient Instructions (Signed)
Michelle Short, I greatly value your feedback.  If you receive a survey following your visit with [place] today, we appreciate you taking the time to fill it out.  Thanks, [name], CNM, [facility]  [facility] HAS MOVED!!! It is now [facility] at [facility] ([address] [place], [place] [address]) Entrance located off of [address] Free 24/7 valet parking   Home Blood Pressure Monitoring for Patients   Your provider has recommended that you check your blood pressure (BP) at least once a week at home. If you do not have a blood pressure cuff at home, one will be provided for you. Contact your provider if you have not received your monitor within 1 week.   Helpful Tips for Accurate Home Blood Pressure Checks  . Don't smoke, exercise, or drink caffeine 30 minutes before checking your BP . Use the restroom before checking your BP (a full bladder can raise your pressure) . Relax in a comfortable upright chair . Feet on the ground . Left arm resting comfortably on a flat surface at the level of your heart . Legs uncrossed . Back supported . Sit quietly and don't talk . Place the cuff on your bare arm . Adjust snuggly, so that only two fingertips can fit between your skin and the top of the cuff . Check 2 readings separated by at least one minute . Keep a log of your BP readings . For a visual, please reference this diagram: http://ccnc.care/bpdiagram  Provider Name: [name]     Phone: [phone]  Zone 1: ALL CLEAR  Continue to monitor your symptoms:  . BP reading is less than 140 (top number) or less than 90 (bottom number)  . No right upper stomach pain . No headaches or seeing spots . No feeling nauseated or throwing up . No swelling in face and hands  Zone 2: CAUTION Call your doctor's office for any of the following:  . BP reading is greater than 140 (top number) or greater than 90 (bottom number)  . Stomach pain under your ribs in the middle  or right side . Headaches or seeing spots . Feeling nauseated or throwing up . Swelling in face and hands  Zone 3: EMERGENCY  Seek immediate medical care if you have any of the following:  . BP reading is greater than160 (top number) or greater than 110 (bottom number) . Severe headaches not improving with Tylenol . Serious difficulty catching your breath . Any worsening symptoms from Zone 2     Call the office [phone]) or go to [facility] if:  You begin to have strong, frequent contractions  Your water breaks.  Sometimes it is a big gush of fluid, sometimes it is just a trickle that keeps getting your panties wet or running down your legs  You have vaginal bleeding.  It is normal to have a small amount of spotting if your cervix was checked.   You don't feel your baby moving like normal.  If you don't, get you something to eat and drink and lay down and focus on feeling your baby move.  You should feel at least 10 movements in 2 hours.  If you don't, you should call the office or go to [facility].    Preterm Labor and Birth Information  The normal length of a pregnancy is 39-41 weeks. Preterm labor is when labor starts before 37 completed weeks of pregnancy. What are the risk factors  for preterm labor? Preterm labor is more likely to occur in women who:  Have certain infections during pregnancy such as a bladder infection, sexually transmitted infection, or infection inside the uterus (chorioamnionitis).  Have a shorter-than-normal cervix.  Have gone into preterm labor before.  Have had surgery on their cervix.  Are younger than age 27 or older than age 27.  Are African American.  Are pregnant with twins or multiple babies (multiple gestation).  Take street drugs or smoke while pregnant.  Do not gain enough weight while pregnant.  Became pregnant shortly after having been pregnant. What are the symptoms of preterm labor? Symptoms of preterm labor  include:  Cramps similar to those that can happen during a menstrual period. The cramps may happen with diarrhea.  Pain in the abdomen or lower back.  Regular uterine contractions that may feel like tightening of the abdomen.  A feeling of increased pressure in the pelvis.  Increased watery or bloody mucus discharge from the vagina.  Water breaking (ruptured amniotic sac). Why is it important to recognize signs of preterm labor? It is important to recognize signs of preterm labor because babies who are born prematurely may not be fully developed. This can put them at an increased risk for:  Long-term (chronic) heart and lung problems.  Difficulty immediately after birth with regulating body systems, including blood sugar, body temperature, heart rate, and breathing rate.  Bleeding in the brain.  Cerebral palsy.  Learning difficulties.  Death. These risks are highest for babies who are born before 34 weeks of pregnancy. How is preterm labor treated? Treatment depends on the length of your pregnancy, your condition, and the health of your baby. It may involve:  Having a stitch (suture) placed in your cervix to prevent your cervix from opening too early (cerclage).  Taking or being given medicines, such as: ? Hormone medicines. These may be given early in pregnancy to help support the pregnancy. ? Medicine to stop contractions. ? Medicines to help mature the baby's lungs. These may be prescribed if the risk of delivery is high. ? Medicines to prevent your baby from developing cerebral palsy. If the labor happens before 34 weeks of pregnancy, you may need to stay in the hospital. What should I do if I think I am in preterm labor? If you think that you are going into preterm labor, call your health care provider right away. How can I prevent preterm labor in future pregnancies? To increase your chance of having a full-term pregnancy:  Do not use any tobacco products, such as  cigarettes, chewing tobacco, and e-cigarettes. If you need help quitting, ask your health care provider.  Do not use street drugs or medicines that have not been prescribed to you during your pregnancy.  Talk with your health care provider before taking any herbal supplements, even if you have been taking them regularly.  Make sure you gain a healthy amount of weight during your pregnancy.  Watch for infection. If you think that you might have an infection, get it checked right away.  Make sure to tell your health care provider if you have gone into preterm labor before. This information is not intended to replace advice given to you by your health care provider. Make sure you discuss any questions you have with your health care provider. Document Released: 01/28/2004 Document Revised: 04/19/2016 Document Reviewed: 03/30/2016 Elsevier Interactive Patient Education  2019 [employer]

## 2019-04-18 NOTE — Progress Notes (Signed)
   TELEHEALTH VIRTUAL OBSTETRICS VISIT ENCOUNTER NOTE Patient name: Michelle Short MRN [id number]  Date of birth: [birth year]  I connected with patient on 04/18/19 at  2:00 PM EDT by [facility] and verified that I am speaking with the correct person using two identifiers. Due to COVID-19 recommendations, pt is not currently in our office.    I discussed the limitations, risks, security and privacy concerns of performing an evaluation and management service by telephone and the availability of in person appointments. I also discussed with the patient that there may be a patient responsible charge related to this service. The patient expressed understanding and agreed to proceed.  Chief Complaint:   Routine Prenatal Visit (some time feet swell)  History of Present Illness:   Michelle Short is a 28 y.o. G2P0010 female at [redacted]w[redacted]d with an Estimated Date of Delivery: 06/02/19 being evaluated today for ongoing management of a low-risk pregnancy.  Today she reports legs swelling, occ LUQ pain.. Contractions: Irregular.  .  Movement: Present. denies leaking of fluid. Review of Systems:   Pertinent items are noted in HPI Denies abnormal vaginal discharge w/ itching/odor/irritation, headaches, visual changes, shortness of breath, chest pain, abdominal pain, severe nausea/vomiting, or problems with urination or bowel movements unless otherwise stated above. Pertinent History Reviewed:  Reviewed past medical,surgical, social, obstetrical and family history.  Reviewed problem list, medications and allergies. Physical Assessment:   Vitals:   04/18/19 1425  BP: 123/78  Pulse: (!) 106  Weight: 142 lb (64.4 kg)  Body mass index is 24.37 kg/m.        Physical Examination:   General:  Alert, oriented and cooperative.   Mental Status: Normal mood and affect perceived. Normal judgment and thought content.  Rest of physical exam deferred due to type of encounter  No results found for this or any previous visit  (from the past 24 hour(s)).  Assessment & Plan:  1) Pregnancy G2P0010 at [redacted]w[redacted]d with an Estimated Date of Delivery: 06/02/19   2) Legs swelling, elevate, bp good   Meds: No orders of the defined types were placed in this encounter.   Labs/procedures today: none  Plan:  Continue routine obstetrical care. Has BP cuff. Check bp weekly, let us know if >140/90.   Reviewed: Preterm labor symptoms and general obstetric precautions including but not limited to vaginal bleeding, contractions, leaking of fluid and fetal movement were reviewed in detail with the patient. The patient was advised to call back or seek an in-person office evaluation/go to MAU at [facility] for any urgent or concerning symptoms. All questions were answered. Please refer to After Visit Summary for other counseling recommendations.   I provided 10 minutes of non-face-to-face time during this encounter.  Follow-up: Return in about 2 weeks (around 05/02/2019) for LROB webex.  No orders of the defined types were placed in this encounter.  Cheral Marker CNM, [facility] 04/18/2019 2:36 PM

## 2019-05-01 ENCOUNTER — Encounter: Payer: Self-pay | Admitting: *Deleted

## 2019-05-02 ENCOUNTER — Ambulatory Visit (INDEPENDENT_AMBULATORY_CARE_PROVIDER_SITE_OTHER): Payer: Medicaid Other | Admitting: Women's Health

## 2019-05-02 ENCOUNTER — Other Ambulatory Visit: Payer: Self-pay

## 2019-05-02 ENCOUNTER — Encounter: Payer: Self-pay | Admitting: Women's Health

## 2019-05-02 VITALS — BP 124/83 | HR 112 | Wt 146.0 lb

## 2019-05-02 DIAGNOSIS — Z3483 Encounter for supervision of other normal pregnancy, third trimester: Secondary | ICD-10-CM

## 2019-05-02 DIAGNOSIS — Z3A35 35 weeks gestation of pregnancy: Secondary | ICD-10-CM

## 2019-05-02 NOTE — Progress Notes (Signed)
   [place] VIRTUAL OBSTETRICS VISIT ENCOUNTER NOTE Patient name: Michelle Short MRN [id number]  Date of birth: 09/20/1991  I connected with patient on 05/02/19 at  3:00 PM EDT by [facility] and verified that I am speaking with the correct person using two identifiers. Due to COVID-19 recommendations, pt is not currently in our office.    I discussed the limitations, risks, security and privacy concerns of performing an evaluation and management service by telephone and the availability of in person appointments. I also discussed with the patient that there may be a patient responsible charge related to this service. The patient expressed understanding and agreed to proceed.  Chief Complaint:   Routine Prenatal Visit  History of Present Illness:   Michelle Short is a 28 y.o. G2P0010 female at [redacted]w[redacted]d with an Estimated Date of Delivery: 06/02/19 being evaluated today for ongoing management of a low-risk pregnancy.  Today she reports no complaints. Contractions: Irregular.  .  Movement: Present. denies leaking of fluid. Review of Systems:   Pertinent items are noted in HPI Denies abnormal vaginal discharge w/ itching/odor/irritation, headaches, visual changes, shortness of breath, chest pain, abdominal pain, severe nausea/vomiting, or problems with urination or bowel movements unless otherwise stated above. Pertinent History Reviewed:  Reviewed past medical,surgical, social, obstetrical and family history.  Reviewed problem list, medications and allergies. Physical Assessment:   Vitals:   05/02/19 1501  BP: 124/83  Pulse: (!) 112  Weight: 146 lb (66.2 kg)  Body mass index is 25.06 kg/m.        Physical Examination:   General:  Alert, oriented and cooperative.   Mental Status: Normal mood and affect perceived. Normal judgment and thought content.  Rest of physical exam deferred due to type of encounter  No results found for this or any previous visit (from the past 24 hour(s)).   Assessment & Plan:  1) Pregnancy G2P0010 at [redacted]w[redacted]d with an Estimated Date of Delivery: 06/02/19    Meds: No orders of the defined types were placed in this encounter.  Labs/procedures today: none  Plan:  Continue routine obstetrical care. Has BP cuff. Check bp weekly, let us know if >140/90.   Reviewed: Preterm labor symptoms and general obstetric precautions including but not limited to vaginal bleeding, contractions, leaking of fluid and fetal movement were reviewed in detail with the patient. The patient was advised to call back or seek an in-person office evaluation/go to MAU at [facility] for any urgent or concerning symptoms. All questions were answered. Please refer to After Visit Summary for other counseling recommendations.   I provided 15 minutes of non-face-to-face time during this encounter.  Follow-up: Return in about 1 week (around 05/09/2019) for LROB in person.  No orders of the defined types were placed in this encounter.  [place], [facility] 05/02/2019 3:13 PM

## 2019-05-02 NOTE — Patient Instructions (Signed)
Morrie SheldonImani B Gavitt, I greatly value your feedback.  If you receive a survey following your visit with us today, we appreciate you taking the time to fill it out.  Thanks, Joellyn HaffKim Daxen Lanum, CNM, [facility]  [facility] M[facility] is now [facility] ([address] , [place] [id number]) Entrance located off of [address] Free 24/7 valet parking   Home Blood Pressure Monitoring for Patients   Your provider has recommended that you check your blood pressure (BP) at least once a week at home. If you do not have a blood pressure cuff at home, one will be provided for you. Contact your provider if you have not received your monitor within 1 week.   Helpful Tips for Accurate Home Blood Pressure Checks  . Don't smoke, exercise, or drink caffeine 30 minutes before checking your BP . Use the restroom before checking your BP (a full bladder can raise your pressure) . Relax in a comfortable upright chair . Feet on the ground . Left arm resting comfortably on a flat surface at the level of your heart . Legs uncrossed . Back supported . Sit quietly and don't talk . Place the cuff on your bare arm . Adjust snuggly, so that only two fingertips can fit between your skin and the top of the cuff . Check 2 readings separated by at least one minute . Keep a log of your BP readings . For a visual, please reference this diagram: http://ccnc.care/bpdiagram  Provider Name: [name]     Phone: [phone]  Zone 1: ALL CLEAR  Continue to monitor your symptoms:  . BP reading is less than 140 (top number) or less than 90 (bottom number)  . No right upper stomach pain . No headaches or seeing spots . No feeling nauseated or throwing up . No swelling in face and hands  Zone 2: CAUTION Call your doctor's office for any of the following:  . BP reading is greater than 140 (top number) or greater than 90 (bottom number)  . Stomach pain under your ribs in the middle  or right side . Headaches or seeing spots . Feeling nauseated or throwing up . Swelling in face and hands  Zone 3: EMERGENCY  Seek immediate medical care if you have any of the following:  . BP reading is greater than160 (top number) or greater than 110 (bottom number) . Severe headaches not improving with Tylenol . Serious difficulty catching your breath . Any worsening symptoms from Zone 2    Call the office [phone]) or go to [facility] if:  You begin to have strong, frequent contractions  Your water breaks.  Sometimes it is a big gush of fluid, sometimes it is just a trickle that keeps getting your panties wet or running down your legs  You have vaginal bleeding.  It is normal to have a small amount of spotting if your cervix was checked.   You don't feel your baby moving like normal.  If you don't, get you something to eat and drink and lay down and focus on feeling your baby move.  You should feel at least 10 movements in 2 hours.  If you don't, you should call the office or go to [facility].   Tips to Help You Sleep Better:   Get into a bedtime routine, try to do the same thing every night before going to bed to try to help your body wind down  Warm baths  Avoid caffeine for at least 3 hours before going to sleep   Keep your room at a slightly cooler temperature, can try running a fan  Turn off TV, lights, phone, electronics  Lots of pillows if needed to help you get comfortable  Lavender scented items can help you sleep. You can place lavender essential oil on a cotton ball and place under your pillowcase, or place in a diffuser. [employer] has a lavender scented sleep line (plug-ins, sprays, etc). Look in the pillow aisle for lavender scented pillows.   If none of the above things help, you can try 1/2 to 1 tablet of benadryl, unisom, or tylenol pm. Do not take this every night, only when you really need it.    Preterm Labor and Birth Information  The  normal length of a pregnancy is 39-41 weeks. Preterm labor is when labor starts before 37 completed weeks of pregnancy. What are the risk factors for preterm labor? Preterm labor is more likely to occur in women who:  Have certain infections during pregnancy such as a bladder infection, sexually transmitted infection, or infection inside the uterus (chorioamnionitis).  Have a shorter-than-normal cervix.  Have gone into preterm labor before.  Have had surgery on their cervix.  Are younger than age 64 or older than age 33.  Are African American.  Are pregnant with twins or multiple babies (multiple gestation).  Take street drugs or smoke while pregnant.  Do not gain enough weight while pregnant.  Became pregnant shortly after having been pregnant. What are the symptoms of preterm labor? Symptoms of preterm labor include:  Cramps similar to those that can happen during a menstrual period. The cramps may happen with diarrhea.  Pain in the abdomen or lower back.  Regular uterine contractions that may feel like tightening of the abdomen.  A feeling of increased pressure in the pelvis.  Increased watery or bloody mucus discharge from the vagina.  Water breaking (ruptured amniotic sac). Why is it important to recognize signs of preterm labor? It is important to recognize signs of preterm labor because babies who are born prematurely may not be fully developed. This can put them at an increased risk for:  Long-term (chronic) heart and lung problems.  Difficulty immediately after birth with regulating body systems, including blood sugar, body temperature, heart rate, and breathing rate.  Bleeding in the brain.  Cerebral palsy.  Learning difficulties.  Death. These risks are highest for babies who are born before 41 weeks of pregnancy. How is preterm labor treated? Treatment depends on the length of your pregnancy, your condition, and the health of your baby. It may involve:   Having a stitch (suture) placed in your cervix to prevent your cervix from opening too early (cerclage).  Taking or being given medicines, such as: ? Hormone medicines. These may be given early in pregnancy to help support the pregnancy. ? Medicine to stop contractions. ? Medicines to help mature the baby's lungs. These may be prescribed if the risk of delivery is high. ? Medicines to prevent your baby from developing cerebral palsy. If the labor happens before 34 weeks of pregnancy, you may need to stay in the hospital. What should I do if I think I am in preterm labor? If you think that you are going into preterm labor, call your health care provider right away. How can I prevent preterm labor in future pregnancies? To increase your chance of having a full-term pregnancy:  Do not use  any tobacco products, such as cigarettes, chewing tobacco, and e-cigarettes. If you need help quitting, ask your health care provider.  Do not use street drugs or medicines that have not been prescribed to you during your pregnancy.  Talk with your health care provider before taking any herbal supplements, even if you have been taking them regularly.  Make sure you gain a healthy amount of weight during your pregnancy.  Watch for infection. If you think that you might have an infection, get it checked right away.  Make sure to tell your health care provider if you have gone into preterm labor before. This information is not intended to replace advice given to you by your health care provider. Make sure you discuss any questions you have with your health care provider. Document Released: 01/28/2004 Document Revised: 04/19/2016 Document Reviewed: 03/30/2016 Elsevier Interactive Patient Education  2019 [employer]

## 2019-05-07 ENCOUNTER — Encounter: Payer: Self-pay | Admitting: *Deleted

## 2019-05-08 ENCOUNTER — Encounter: Payer: Self-pay | Admitting: Obstetrics and Gynecology

## 2019-05-08 ENCOUNTER — Other Ambulatory Visit: Payer: Self-pay

## 2019-05-08 ENCOUNTER — Ambulatory Visit (INDEPENDENT_AMBULATORY_CARE_PROVIDER_SITE_OTHER): Payer: Medicaid Other | Admitting: Obstetrics and Gynecology

## 2019-05-08 VITALS — BP 113/70 | HR 104 | Wt 149.0 lb

## 2019-05-08 DIAGNOSIS — Z3A36 36 weeks gestation of pregnancy: Secondary | ICD-10-CM | POA: Diagnosis not present

## 2019-05-08 DIAGNOSIS — Z331 Pregnant state, incidental: Secondary | ICD-10-CM

## 2019-05-08 DIAGNOSIS — Z1389 Encounter for screening for other disorder: Secondary | ICD-10-CM

## 2019-05-08 DIAGNOSIS — Z3483 Encounter for supervision of other normal pregnancy, third trimester: Secondary | ICD-10-CM

## 2019-05-08 LAB — POCT URINALYSIS DIPSTICK OB
Blood, UA: NEGATIVE
Glucose, UA: NEGATIVE
Ketones, UA: NEGATIVE
Leukocytes, UA: NEGATIVE
Nitrite, UA: NEGATIVE
POC,PROTEIN,UA: NEGATIVE

## 2019-05-08 LAB — OB RESULTS CONSOLE GC/CHLAMYDIA: Gonorrhea: NEGATIVE

## 2019-05-08 NOTE — Progress Notes (Signed)
Patient ID: Michelle Short, female   DOB: Jan 18, 1991, 28 y.o.   MRN: [id number]    LOW-RISK PREGNANCY VISIT Patient name: Michelle Short MRN [id number]  Date of birth: 09-11-1991 Chief Complaint:   Routine Prenatal Visit (gbs-gc-chl)  History of Present Illness:   Michelle Short is a 28 y.o. G94P0010 female at [redacted]w[redacted]d with an Estimated Date of Delivery: 06/02/19 being seen today for ongoing management of a low-risk pregnancy. Can her feet starting to swell some now but otherwise has no complaints Today she reports some feet swelling. Contractions: Irregular.  .  Movement: Present. denies leaking of fluid. Review of Systems:   Pertinent items are noted in HPI Denies abnormal vaginal discharge w/ itching/odor/irritation, headaches, visual changes, shortness of breath, chest pain, abdominal pain, severe nausea/vomiting, or problems with urination or bowel movements unless otherwise stated above. Pertinent History Reviewed:  Reviewed past medical,surgical, social, obstetrical and family history.  Reviewed problem list, medications and allergies. Physical Assessment:   Vitals:   05/08/19 1454  BP: 113/70  Pulse: (!) 104  Weight: 149 lb (67.6 kg)  Body mass index is 25.58 kg/m.        Physical Examination:   General appearance: Well appearing, and in no distress  Mental status: Alert, oriented to person, place, and time  Skin: Warm & dry  Cardiovascular: Normal heart rate noted  Respiratory: Normal respiratory effort, no distress  Abdomen: Soft, gravid, nontender  Pelvic: Cervical exam performed long closed and high, external hemorrhoid        Extremities: Edema: Trace  Fetal Status: Fetal Heart Rate (bpm): 126 Fundal Height: 37 cm Movement: Present    Results for orders placed or performed in visit on 05/08/19 (from the past 24 hour(s))  POC Urinalysis Dipstick OB   Collection Time: 05/08/19  2:55 PM  Result Value Ref Range   Color, UA     Clarity, UA     Glucose, UA Negative  Negative   Bilirubin, UA     Ketones, UA neg    Spec Grav, UA     Blood, UA neg    pH, UA     POC,PROTEIN,UA Negative Negative, Trace, Small (1+), Moderate (2+), Large (3+), 4+   Urobilinogen, UA     Nitrite, UA neg    Leukocytes, UA Negative Negative   Appearance     Odor      Assessment & Plan:  1) Low-risk pregnancy G2P0010 at [redacted]w[redacted]d with an Estimated Date of Delivery: 06/02/19    Meds: No orders of the defined types were placed in this encounter.  Labs/procedures today: GBS/GCCHL  Plan:   1. Continue routine obstetrical care  2. F/u 1 week televisit  Follow-up: Return in about 1 week (around 05/15/2019).  Orders Placed This Encounter  Procedures  . Strep Gp B NAA  . GC/Chlamydia Probe Amp  . POC Urinalysis Dipstick OB   By signing my name below, I, Samul Dada, attest that this documentation has been prepared under the direction and in the presence of Jonnie Kind, MD. Electronically Signed: [place]. 05/08/19. 3:21 PM.  I personally performed the services described in this documentation, which was SCRIBED in my presence. The recorded information has been reviewed and considered accurate. It has been edited as necessary during review. Jonnie Kind, MD

## 2019-05-10 LAB — STREP GP B NAA: Strep Gp B NAA: POSITIVE — AB

## 2019-05-13 LAB — GC/CHLAMYDIA PROBE AMP
Chlamydia trachomatis, NAA: NEGATIVE
Neisseria Gonorrhoeae by PCR: NEGATIVE

## 2019-05-15 ENCOUNTER — Encounter: Payer: Self-pay | Admitting: Women's Health

## 2019-05-15 ENCOUNTER — Ambulatory Visit (INDEPENDENT_AMBULATORY_CARE_PROVIDER_SITE_OTHER): Payer: Medicaid Other | Admitting: Women's Health

## 2019-05-15 ENCOUNTER — Other Ambulatory Visit: Payer: Self-pay

## 2019-05-15 VITALS — BP 121/67 | HR 99 | Wt 151.0 lb

## 2019-05-15 DIAGNOSIS — Z3483 Encounter for supervision of other normal pregnancy, third trimester: Secondary | ICD-10-CM

## 2019-05-15 DIAGNOSIS — Z3A37 37 weeks gestation of pregnancy: Secondary | ICD-10-CM

## 2019-05-15 NOTE — Patient Instructions (Signed)
Michelle Short, I greatly value your feedback.  If you receive a survey following your visit with [place] today, we appreciate you taking the time to fill it out.  Thanks, Michelle Short, CNM, [facility]  [name]!!! It is now [name] at [facility] ([name], [name] [address]) Entrance located off of [address] parking    Call the office [phone]) or go to [facility] if:  You begin to have strong, frequent contractions  Your water breaks.  Sometimes it is a big gush of fluid, sometimes it is just a trickle that keeps getting your panties wet or running down your legs  You have vaginal bleeding.  It is normal to have a small amount of spotting if your cervix was checked.   You don't feel your baby moving like normal.  If you don't, get you something to eat and drink and lay down and focus on feeling your baby move.  You should feel at least 10 movements in 2 hours.  If you don't, you should call the office or go to [name]   Your provider has recommended that you check your blood pressure (BP) at least once a week at home. If you do not have a blood pressure cuff at home, one will be provided for you. Contact your provider if you have not received your monitor within 1 week.   Helpful Tips for Accurate Home Blood Pressure Checks  . Don't smoke, exercise, or drink caffeine 30 minutes before checking your BP . Use the restroom before checking your BP (a full bladder can raise your pressure) . Relax in a comfortable upright chair . Feet on the ground . Left arm resting comfortably on a flat surface at the level of your heart . Legs uncrossed . Back supported . Sit quietly and don't talk . Place the cuff on your bare arm . Adjust snuggly, so that only two fingertips can fit between your skin and the top of the cuff . Check 2 readings separated by at least one minute  . Keep a log of your BP readings . For a visual, please reference this diagram: http://ccnc.care/bpdiagram  Provider Name: [name]     Phone: [phone]  Zone 1: ALL CLEAR  Continue to monitor your symptoms:  . BP reading is less than 140 (top number) or less than 90 (bottom number)  . No right upper stomach pain . No headaches or seeing spots . No feeling nauseated or throwing up . No swelling in face and hands  Zone 2: CAUTION Call your doctor's office for any of the following:  . BP reading is greater than 140 (top number) or greater than 90 (bottom number)  . Stomach pain under your ribs in the middle or right side . Headaches or seeing spots . Feeling nauseated or throwing up . Swelling in face and hands  Zone 3: EMERGENCY  Seek immediate medical care if you have any of the following:  . BP reading is greater than160 (top number) or greater than 110 (bottom number) . Severe headaches not improving with Tylenol . Serious difficulty catching your breath . Any worsening symptoms from Zone 2    Braxton Hicks Contractions Contractions of the uterus can occur throughout pregnancy, but they are not always a sign that you are in labor. You may have practice contractions called Braxton Hicks contractions. These  false labor contractions are sometimes confused with true labor. What are [place] contractions? Braxton Hicks contractions are tightening movements that occur in the muscles of the uterus before labor. Unlike true labor contractions, these contractions do not result in opening (dilation) and thinning of the cervix. Toward the end of pregnancy (32-34 weeks), Braxton Hicks contractions can happen more often and may become stronger. These contractions are sometimes difficult to tell apart from true labor because they can be very uncomfortable. You should not feel embarrassed if you go to the hospital with false labor. Sometimes, the only way to tell if you are in  true labor is for your health care provider to look for changes in the cervix. The health care provider will do a physical exam and may monitor your contractions. If you are not in true labor, the exam should show that your cervix is not dilating and your water has not broken. If there are no other health problems associated with your pregnancy, it is completely safe for you to be sent home with false labor. You may continue to have Braxton Hicks contractions until you go into true labor. How to tell the difference between true labor and false labor True labor  Contractions last 30-70 seconds.  Contractions become very regular.  Discomfort is usually felt in the top of the uterus, and it spreads to the lower abdomen and low back.  Contractions do not go away with walking.  Contractions usually become more intense and increase in frequency.  The cervix dilates and gets thinner. False labor  Contractions are usually shorter and not as strong as true labor contractions.  Contractions are usually irregular.  Contractions are often felt in the front of the lower abdomen and in the groin.  Contractions may go away when you walk around or change positions while lying down.  Contractions get weaker and are shorter-lasting as time goes on.  The cervix usually does not dilate or become thin. Follow these instructions at home:   Take over-the-counter and prescription medicines only as told by your health care provider.  Keep up with your usual exercises and follow other instructions from your health care provider.  Eat and drink lightly if you think you are going into labor.  If Braxton Hicks contractions are making you uncomfortable: ? Change your position from lying down or resting to walking, or change from walking to resting. ? Sit and rest in a tub of warm water. ? Drink enough fluid to keep your urine pale yellow. Dehydration may cause these contractions. ? Do slow and deep  breathing several times an hour.  Keep all follow-up prenatal visits as told by your health care provider. This is important. Contact a health care provider if:  You have a fever.  You have continuous pain in your abdomen. Get help right away if:  Your contractions become stronger, more regular, and closer together.  You have fluid leaking or gushing from your vagina.  You pass blood-tinged mucus (bloody show).  You have bleeding from your vagina.  You have low back pain that you never had before.  You feel your baby's head pushing down and causing pelvic pressure.  Your baby is not moving inside you as much as it used to. Summary  Contractions that occur before labor are called Braxton Hicks contractions, false labor, or practice contractions.  Braxton Hicks contractions are usually shorter, weaker, farther apart, and less regular than true labor contractions. True labor contractions usually become progressively  stronger and regular, and they become more frequent.  Manage discomfort from [facility] contractions by changing position, resting in a warm bath, drinking plenty of water, or practicing deep breathing. This information is not intended to replace advice given to you by your health care provider. Make sure you discuss any questions you have with your health care provider. Document Released: 03/23/2017 Document Revised: 08/22/2017 Document Reviewed: 03/23/2017 Elsevier Interactive Patient Education  2019 [employer].

## 2019-05-15 NOTE — Progress Notes (Signed)
   [place] VIRTUAL OBSTETRICS VISIT ENCOUNTER NOTE Patient name: Michelle Short MRN [id number]  Date of birth: 03-13-1991  I connected with patient on 05/15/19 at  4:00 PM EDT by [facility] and verified that I am speaking with the correct person using two identifiers. Due to COVID-19 recommendations, pt is not currently in our office.    I discussed the limitations, risks, security and privacy concerns of performing an evaluation and management service by telephone and the availability of in person appointments. I also discussed with the patient that there may be a patient responsible charge related to this service. The patient expressed understanding and agreed to proceed.  Chief Complaint:   Routine Prenatal Visit  History of Present Illness:   Michelle Short is a 28 y.o. G2P0010 female at [redacted]w[redacted]d with an Estimated Date of Delivery: 06/02/19 being evaluated today for ongoing management of a low-risk pregnancy.  Today she reports no complaints. Contractions: Irregular. Vag. Bleeding: None.  Movement: Present. denies leaking of fluid. Review of Systems:   Pertinent items are noted in HPI Denies abnormal vaginal discharge w/ itching/odor/irritation, headaches, visual changes, shortness of breath, chest pain, abdominal pain, severe nausea/vomiting, or problems with urination or bowel movements unless otherwise stated above. Pertinent History Reviewed:  Reviewed past medical,surgical, social, obstetrical and family history.  Reviewed problem list, medications and allergies. Physical Assessment:   Vitals:   05/15/19 1618  BP: 121/67  Pulse: 99  Weight: 151 lb (68.5 kg)  Body mass index is 25.92 kg/m.        Physical Examination:   General:  Alert, oriented and cooperative.   Mental Status: Normal mood and affect perceived. Normal judgment and thought content.  Rest of physical exam deferred due to type of encounter  No results found for this or any previous visit (from the past 24  hour(s)).  Assessment & Plan:  1) Pregnancy G2P0010 at [redacted]w[redacted]d with an Estimated Date of Delivery: 06/02/19   2) Seizures, well controlled on Keppra   Meds: No orders of the defined types were placed in this encounter.   Labs/procedures today: none  Plan:  Continue routine obstetrical care.  Has home bp cuff.  Check bp weekly, let us know if >140/90.   Reviewed: Term labor symptoms and general obstetric precautions including but not limited to vaginal bleeding, contractions, leaking of fluid and fetal movement were reviewed in detail with the patient. The patient was advised to call back or seek an in-person office evaluation/go to MAU at [facility] for any urgent or concerning symptoms. All questions were answered. Please refer to After Visit Summary for other counseling recommendations.    I provided 10 minutes of non-face-to-face time during this encounter.  Follow-up: Return in about 1 week (around 05/22/2019) for [place], [place].  No orders of the defined types were placed in this encounter.  [place], [facility] 05/15/2019 4:28 PM

## 2019-05-21 ENCOUNTER — Encounter: Payer: Self-pay | Admitting: *Deleted

## 2019-05-22 ENCOUNTER — Telehealth: Payer: Medicaid Other | Admitting: Advanced Practice Midwife

## 2019-05-22 ENCOUNTER — Other Ambulatory Visit: Payer: Self-pay

## 2019-05-22 DIAGNOSIS — Z3483 Encounter for supervision of other normal pregnancy, third trimester: Secondary | ICD-10-CM

## 2019-05-22 NOTE — Progress Notes (Unsigned)
Has some swelling. Reports good fetal movement. Denies pain pressure vaginal bleeding or discharge.

## 2019-05-22 NOTE — Progress Notes (Unsigned)
   [facility] VIRTUAL OBSTETRICS VISIT ENCOUNTER NOTE  I connected with Michelle Short on 05/22/19 at  9:00 AM EDT by telephone at home and verified that I am speaking with the correct person using two identifiers.   I discussed the limitations, risks, security and privacy concerns of performing an evaluation and management service by telephone and the availability of in person appointments. I also discussed with the patient that there may be a patient responsible charge related to this service. The patient expressed understanding and agreed to proceed.  Subjective:  Michelle Short is a 28 y.o. G2P0010 at [redacted]w[redacted]d being followed for ongoing prenatal care.  She is currently monitored for the following issues for this low-risk pregnancy and has Marijuana abuse; Seizure ([place]); Cervical cancer screening; Common migraine with intractable migraine; Supervision of normal pregnancy; and Low-lying placenta on their problem list.  Patient reports no complaints other than problems sleeping  Reports fetal movement. Denies any contractions, bleeding or leaking of fluid.   The following portions of the patient's history were reviewed and updated as appropriate: allergies, current medications, past family history, past medical history, past social history, past surgical history and problem list.   Objective:   General:  Alert, oriented and cooperative.   Mental Status: Normal mood and affect perceived. Normal judgment and thought content.  Rest of physical exam deferred due to type of encounter  Assessment and Plan:  Pregnancy: G2P0010 at [redacted]w[redacted]d There are no diagnoses linked to this encounter. Term labor symptoms and general obstetric precautions including but not limited to vaginal bleeding, contractions, leaking of fluid and fetal movement were reviewed in detail with the patient.  I discussed the assessment and treatment plan with the patient. The patient was provided an opportunity to ask questions and  all were answered. The patient agreed with the plan and demonstrated an understanding of the instructions. The patient was advised to call back or seek an in-person office evaluation/go to MAU at [facility] for any urgent or concerning symptoms. Please refer to After Visit Summary for other counseling recommendations.   I provided 10 minutes of non-face-to-face time during this encounter.  Return in about 1 week (around 05/29/2019) for LROB in person.  No future appointments.  Christin Fudge, [place] for [employer], [place]

## 2019-05-22 NOTE — Patient Instructions (Signed)
AM I IN LABOR? What is labor? Labor is the work that your body does to birth your baby. Your uterus (the womb) contracts. Your cervix (the mouth of the uterus) opens. You will push your baby out into the world.  What do contractions (labor pains) feel like? When they first start, contractions usually feel like cramps during your period. Sometimes you feel pain in your back. Most often, contractions feel like muscles pulling painfully in your lower belly. At first, the contractions will probably be 15 to 20 minutes apart. They will not feel too painful. As labor goes on, the contractions get stronger, closer together, and more painful.  How do I time the contractions? Time your contractions by counting the number of minutes from the start of one contraction to the start of the next contraction.  What should I do when the contractions start? If it is night and you can sleep, sleep. If it happens during the day, here are some things you can do to take care of yourself at home: ? Walk. If the pains you are having are real labor, walking will make the contractions come faster and harder. If the contractions are not going to continue and be real labor, walking will make the contractions slow down. ? Take a shower or bath. This will help you relax. ? Eat. Labor is a big event. It takes a lot of energy. ? Drink water. Not drinking enough water can cause false labor (contractions that hurt but do not open your cervix). If this is true labor, drinking water will help you have strength to get through your labor. ? Take a nap. Get all the rest you can. ? Get a massage. If your labor is in your back, a strong massage on your lower back may feel very good. Getting a foot massage is always good. ? Don't panic. You can do this. Your body was made for this. You are strong!  When should I go to the hospital or call my health care provider? ? Your contractions have been 5 minutes apart or less for at least 1  hour. ? If several contractions are so painful you cannot walk or talk during one. ? Your bag of waters breaks. (You may have a big gush of water or just water that runs down your legs when you walk.)  Are there other reasons to call my health care provider? Yes, you should call your health care provider or go to the hospital if you start to bleed like you are having a period- blood that soaks your underwear or runs down your legs, if you have sudden severe pain, if your baby has not moved for several hours, or if you are leaking green fluid. The rule is as follows: If you are very concerned about something, call.  Cervical Ripening: May try one or both  Red Raspberry Leaf capsules:  two 300mg  or 400mg  tablets with each meal, 2-3 times a day  Potential Side Effects Of Raspberry Leaf:  Most women do not experience any side effects from drinking raspberry leaf tea. However, nausea and loose stools are possible   IF YOU WANT TO TRY TO GET YOUR CERVIX READY FOR LABOR:  (THIS IS NOT DESIGNED TO START LABOR)  Evening Primrose Oil capsules: may take 1 to 3 capsules daily. May also prick one to release the oil and insert it into your vagina at night.  Some of the potential side effects:  Upset stomach  Loose stools or  diarrhea  Headaches  Nausea:

## 2019-05-30 ENCOUNTER — Ambulatory Visit (INDEPENDENT_AMBULATORY_CARE_PROVIDER_SITE_OTHER): Payer: Medicaid Other | Admitting: Advanced Practice Midwife

## 2019-05-30 ENCOUNTER — Encounter: Payer: Self-pay | Admitting: Advanced Practice Midwife

## 2019-05-30 ENCOUNTER — Other Ambulatory Visit: Payer: Self-pay

## 2019-05-30 VITALS — BP 134/81 | HR 111 | Wt 156.0 lb

## 2019-05-30 DIAGNOSIS — Z3483 Encounter for supervision of other normal pregnancy, third trimester: Secondary | ICD-10-CM

## 2019-05-30 DIAGNOSIS — Z3A39 39 weeks gestation of pregnancy: Secondary | ICD-10-CM

## 2019-05-30 DIAGNOSIS — Z331 Pregnant state, incidental: Secondary | ICD-10-CM

## 2019-05-30 DIAGNOSIS — Z1389 Encounter for screening for other disorder: Secondary | ICD-10-CM

## 2019-05-30 LAB — POCT URINALYSIS DIPSTICK OB
Blood, UA: NEGATIVE
Glucose, UA: NEGATIVE
Ketones, UA: NEGATIVE
Leukocytes, UA: NEGATIVE
Nitrite, UA: NEGATIVE
POC,PROTEIN,UA: NEGATIVE

## 2019-05-30 NOTE — Progress Notes (Signed)
LOW-RISK PREGNANCY VISIT Patient name: Michelle Short MRN [id number]  Date of birth: 25-Jun-1991 Chief Complaint:   Routine Prenatal Visit  History of Present Illness:   Michelle Short is a 28 y.o. G59P0010 female at [redacted]w[redacted]d with an Estimated Date of Delivery: 06/02/19 being seen today for ongoing management of a low-risk pregnancy.  Today she reports no complaints. Contractions: Irregular. Vag. Bleeding: None.  Movement: Present. denies leaking of fluid. Review of Systems:   Pertinent items are noted in HPI Denies abnormal vaginal discharge w/ itching/odor/irritation, headaches, visual changes, shortness of breath, chest pain, abdominal pain, severe nausea/vomiting, or problems with urination or bowel movements unless otherwise stated above.  Pertinent History Reviewed:  Medical & Surgical Hx:   Past Medical History:  Diagnosis Date  . Common migraine with intractable migraine 09/19/2018  . Headache   . Marijuana abuse   . Seizures ([place]) 12/2017   History reviewed. No pertinent surgical history. Family History  Problem Relation Age of Onset  . Hyperlipidemia Mother   . Diabetes Paternal Grandfather   . Cancer Maternal Grandmother        colon cancer  . Heart attack Maternal Grandfather     Current Outpatient Medications:  .  levETIRAcetam (KEPPRA) 500 MG tablet, Take 250 mg by mouth 2 (two) times daily. , Disp: , Rfl:  .  Prenatal Vit-Fe Fumarate-FA (MULTIVITAMIN-PRENATAL) 27-0.8 MG TABS tablet, Take 1 tablet by mouth daily at 12 noon., Disp: , Rfl:  .  promethazine (PHENERGAN) 25 MG tablet, Take 1 tablet (25 mg total) by mouth every 6 (six) hours as needed for nausea or vomiting. (Patient not taking: Reported on 12/26/2018), Disp: 20 tablet, Rfl: 0 Social History: Reviewed -  reports that she has never smoked. She has never used smokeless tobacco.  Physical Assessment:   Vitals:   05/30/19 1502  BP: 134/81  Pulse: (!) 111  Weight: 156 lb (70.8 kg)  Body mass index is  26.78 kg/m.        Physical Examination:   General appearance: Well appearing, and in no distress  Mental status: Alert, oriented to person, place, and time  Skin: Warm & dry  Cardiovascular: Normal heart rate noted  Respiratory: Normal respiratory effort, no distress  Abdomen: Soft, gravid, nontender  Pelvic: Cervical exam performed         Extremities: Edema: Trace  Fetal Status:     Movement: Present    Results for orders placed or performed in visit on 05/30/19 (from the past 24 hour(s))  POC Urinalysis Dipstick OB   Collection Time: 05/30/19  3:05 PM  Result Value Ref Range   Color, UA     Clarity, UA     Glucose, UA Negative Negative   Bilirubin, UA     Ketones, UA neg    Spec Grav, UA     Blood, UA neg    pH, UA     POC,PROTEIN,UA Negative Negative, Trace, Small (1+), Moderate (2+), Large (3+), 4+   Urobilinogen, UA     Nitrite, UA neg    Leukocytes, UA Negative Negative   Appearance     Odor      Assessment & Plan:  1) Low-risk pregnancy G2P0010 at [redacted]w[redacted]d with an Estimated Date of Delivery: 06/02/19     Labs/procedures/US today:   Plan:  Continue routine obstetrical care using red rasp leaf tea BID   Follow-up: Return in about 4 days (around 06/03/2019) for nst w/RN only and Wed or Thurs for  LROB.  Orders Placed This Encounter  Procedures  . POC Urinalysis Dipstick OB   Jacklyn ShellFrances Cresenzo-Dishmon CNM 05/30/2019 3:27 PM

## 2019-06-03 ENCOUNTER — Inpatient Hospital Stay (HOSPITAL_COMMUNITY): Payer: Medicaid Other | Admitting: Anesthesiology

## 2019-06-03 ENCOUNTER — Other Ambulatory Visit: Payer: Medicaid Other

## 2019-06-03 ENCOUNTER — Inpatient Hospital Stay (HOSPITAL_COMMUNITY)
Admission: AD | Admit: 2019-06-03 | Discharge: 2019-06-08 | DRG: 787 | Disposition: A | Discharge status: Home / Self Care | Payer: Medicaid Other | Attending: Obstetrics & Gynecology | Admitting: Obstetrics & Gynecology

## 2019-06-03 ENCOUNTER — Other Ambulatory Visit: Payer: Self-pay

## 2019-06-03 ENCOUNTER — Encounter (HOSPITAL_COMMUNITY): Payer: Self-pay | Admitting: Anesthesiology

## 2019-06-03 ENCOUNTER — Encounter (HOSPITAL_COMMUNITY): Payer: Self-pay

## 2019-06-03 DIAGNOSIS — Z1159 Encounter for screening for other viral diseases: Secondary | ICD-10-CM | POA: Diagnosis not present

## 2019-06-03 DIAGNOSIS — G40909 Epilepsy, unspecified, not intractable, without status epilepticus: Secondary | ICD-10-CM | POA: Diagnosis present

## 2019-06-03 DIAGNOSIS — Z3490 Encounter for supervision of normal pregnancy, unspecified, unspecified trimester: Secondary | ICD-10-CM

## 2019-06-03 DIAGNOSIS — O411230 Chorioamnionitis, third trimester, not applicable or unspecified: Secondary | ICD-10-CM | POA: Diagnosis not present

## 2019-06-03 DIAGNOSIS — O4292 Full-term premature rupture of membranes, unspecified as to length of time between rupture and onset of labor: Principal | ICD-10-CM | POA: Diagnosis present

## 2019-06-03 DIAGNOSIS — O99824 Streptococcus B carrier state complicating childbirth: Secondary | ICD-10-CM | POA: Diagnosis not present

## 2019-06-03 DIAGNOSIS — Z3A40 40 weeks gestation of pregnancy: Secondary | ICD-10-CM | POA: Diagnosis not present

## 2019-06-03 DIAGNOSIS — O480 Post-term pregnancy: Secondary | ICD-10-CM | POA: Diagnosis not present

## 2019-06-03 DIAGNOSIS — O26893 Other specified pregnancy related conditions, third trimester: Secondary | ICD-10-CM | POA: Diagnosis present

## 2019-06-03 DIAGNOSIS — Z3483 Encounter for supervision of other normal pregnancy, third trimester: Secondary | ICD-10-CM

## 2019-06-03 DIAGNOSIS — O99354 Diseases of the nervous system complicating childbirth: Secondary | ICD-10-CM | POA: Diagnosis present

## 2019-06-03 DIAGNOSIS — O324XX0 Maternal care for high head at term, not applicable or unspecified: Secondary | ICD-10-CM | POA: Diagnosis not present

## 2019-06-03 DIAGNOSIS — Z98891 History of uterine scar from previous surgery: Secondary | ICD-10-CM

## 2019-06-03 LAB — CBC
HCT: 36.9 % (ref 36.0–46.0)
Hemoglobin: 11.9 g/dL — ABNORMAL LOW (ref 12.0–15.0)
MCH: 29.8 pg (ref 26.0–34.0)
MCHC: 32.2 g/dL (ref 30.0–36.0)
MCV: 92.5 fL (ref 80.0–100.0)
Platelets: 163 10*3/uL (ref 150–400)
RBC: 3.99 MIL/uL (ref 3.87–5.11)
RDW: 13.4 % (ref 11.5–15.5)
WBC: 7.9 10*3/uL (ref 4.0–10.5)
nRBC: 0 % (ref 0.0–0.2)

## 2019-06-03 LAB — POCT FERN TEST: POCT Fern Test: POSITIVE

## 2019-06-03 LAB — TYPE AND SCREEN
ABO/RH(D): O POS
Antibody Screen: NEGATIVE

## 2019-06-03 LAB — RPR: RPR Ser Ql: NONREACTIVE

## 2019-06-03 LAB — SARS CORONAVIRUS 2 BY RT PCR (HOSPITAL ORDER, PERFORMED IN ~~LOC~~ HOSPITAL LAB): SARS Coronavirus 2: NEGATIVE

## 2019-06-03 MED ORDER — OXYCODONE-ACETAMINOPHEN 5-325 MG PO TABS: 1.0000 | ORAL_TABLET | ORAL | Status: DC | PRN

## 2019-06-03 MED ORDER — LACTATED RINGERS IV SOLN
500.0000 mL | INTRAVENOUS | Status: DC | PRN
  Administered 2019-06-04: 500 mL via INTRAVENOUS

## 2019-06-03 MED ORDER — ONDANSETRON HCL 4 MG/2ML IJ SOLN
4.0000 mg | Freq: Four times a day (QID) | INTRAMUSCULAR | Status: DC | PRN
  Administered 2019-06-03 – 2019-06-05 (×4): 4 mg via INTRAVENOUS
  Filled 2019-06-03 (×3): qty 2

## 2019-06-03 MED ORDER — DIPHENHYDRAMINE HCL 50 MG/ML IJ SOLN: 12.5000 mg | INTRAMUSCULAR | Status: DC | PRN

## 2019-06-03 MED ORDER — LACTATED RINGERS IV SOLN
INTRAVENOUS | Status: DC
  Administered 2019-06-03 – 2019-06-04 (×4): via INTRAVENOUS

## 2019-06-03 MED ORDER — PHENYLEPHRINE 40 MCG/ML (10ML) SYRINGE FOR IV PUSH (FOR BLOOD PRESSURE SUPPORT): 80.0000 ug | PREFILLED_SYRINGE | INTRAVENOUS | Status: DC | PRN

## 2019-06-03 MED ORDER — LACTATED RINGERS IV SOLN
500.0000 mL | Freq: Once | INTRAVENOUS | Status: AC
  Administered 2019-06-03: 500 mL via INTRAVENOUS

## 2019-06-03 MED ORDER — FENTANYL CITRATE (PF) 100 MCG/2ML IJ SOLN
100.0000 ug | INTRAMUSCULAR | Status: DC | PRN
  Administered 2019-06-03 (×4): 100 ug via INTRAVENOUS
  Filled 2019-06-03 (×4): qty 2

## 2019-06-03 MED ORDER — SOD CITRATE-CITRIC ACID 500-334 MG/5ML PO SOLN
30.0000 mL | ORAL | Status: DC | PRN
  Administered 2019-06-04 – 2019-06-05 (×2): 30 mL via ORAL
  Filled 2019-06-03 (×2): qty 30

## 2019-06-03 MED ORDER — EPHEDRINE 5 MG/ML INJ: 10.0000 mg | INTRAVENOUS | Status: DC | PRN

## 2019-06-03 MED ORDER — ACETAMINOPHEN 325 MG PO TABS: 650.0000 mg | ORAL_TABLET | ORAL | Status: DC | PRN

## 2019-06-03 MED ORDER — PHENYLEPHRINE 40 MCG/ML (10ML) SYRINGE FOR IV PUSH (FOR BLOOD PRESSURE SUPPORT)
80.0000 ug | PREFILLED_SYRINGE | INTRAVENOUS | Status: DC | PRN
  Filled 2019-06-03: qty 10

## 2019-06-03 MED ORDER — OXYTOCIN 40 UNITS IN NORMAL SALINE INFUSION - SIMPLE MED
2.5000 [IU]/h | INTRAVENOUS | Status: DC
  Filled 2019-06-03: qty 1000

## 2019-06-03 MED ORDER — FENTANYL 2.5 MCG/ML BUPIVACAINE 1/10 % EPIDURAL INFUSION (WH - ANES)
INTRAMUSCULAR | Status: DC | PRN
  Administered 2019-06-03: 12 mL/h via EPIDURAL
  Administered 2019-06-04: 03:00:00

## 2019-06-03 MED ORDER — SODIUM CHLORIDE 0.9 % IV SOLN
5.0000 10*6.[IU] | Freq: Once | INTRAVENOUS | Status: AC
  Administered 2019-06-03: 5 10*6.[IU] via INTRAVENOUS
  Filled 2019-06-03: qty 5

## 2019-06-03 MED ORDER — LIDOCAINE HCL (PF) 1 % IJ SOLN
30.0000 mL | INTRAMUSCULAR | Status: AC | PRN
  Administered 2019-06-03: 5 mL via SUBCUTANEOUS

## 2019-06-03 MED ORDER — PENICILLIN G 3 MILLION UNITS IVPB - SIMPLE MED
3.0000 10*6.[IU] | INTRAVENOUS | Status: DC
  Administered 2019-06-03 – 2019-06-04 (×10): 3 10*6.[IU] via INTRAVENOUS
  Filled 2019-06-03 (×10): qty 100

## 2019-06-03 MED ORDER — LEVETIRACETAM 250 MG PO TABS
250.0000 mg | ORAL_TABLET | Freq: Two times a day (BID) | ORAL | Status: DC
  Administered 2019-06-03 – 2019-06-04 (×4): 250 mg via ORAL
  Filled 2019-06-03 (×8): qty 1

## 2019-06-03 MED ORDER — FENTANYL-BUPIVACAINE-NACL 0.5-0.125-0.9 MG/250ML-% EP SOLN
12.0000 mL/h | EPIDURAL | Status: DC | PRN
  Administered 2019-06-04: 12 mL/h via EPIDURAL
  Filled 2019-06-03 (×4): qty 250

## 2019-06-03 MED ORDER — OXYTOCIN BOLUS FROM INFUSION: 500.0000 mL | Freq: Once | INTRAVENOUS | Status: DC

## 2019-06-03 MED ORDER — OXYCODONE-ACETAMINOPHEN 5-325 MG PO TABS: 2.0000 | ORAL_TABLET | ORAL | Status: DC | PRN

## 2019-06-03 NOTE — MAU Note (Signed)
Attempted to call report to L&D charge nurse. Charge nurse unable to take report at this time.

## 2019-06-03 NOTE — Anesthesia Procedure Notes (Addendum)
Epidural Patient location during procedure: OB Start time: 06/03/2019 12:48 PM End time: 06/03/2019 1:01 PM  Staffing Anesthesiologist: Barnet Glasgow, MD Performed: anesthesiologist   Preanesthetic Checklist Completed: patient identified, site marked, surgical consent, pre-op evaluation, timeout performed, IV checked, risks and benefits discussed and monitors and equipment checked  Epidural Patient position: sitting Prep: site prepped and draped and DuraPrep Patient monitoring: continuous pulse ox and blood pressure Approach: midline Location: L3-L4 Injection technique: LOR air  Needle:  Needle type: Tuohy  Needle gauge: 17 G Needle length: 9 cm and 9 Needle insertion depth: 5 cm cm Catheter type: closed end flexible Catheter size: 19 Gauge Catheter at skin depth: 10 cm Test dose: negative  Assessment Events: blood not aspirated, injection not painful, no injection resistance, negative IV test and no paresthesia  Additional Notes Patient identified. Risks/Benefits/Options discussed with patient including but not limited to bleeding, infection, nerve damage, paralysis, failed block, incomplete pain control, headache, blood pressure changes, nausea, vomiting, reactions to medication both or allergic, itching and postpartum back pain. Confirmed with bedside nurse the patient's most recent platelet count. Confirmed with patient that they are not currently taking any anticoagulation, have any bleeding history or any family history of bleeding disorders. Patient expressed understanding and wished to proceed. All questions were answered. Sterile technique was used throughout the entire procedure. Please see nursing notes for vital signs. Test dose was given through epidural needle and negative prior to continuing to dose epidural or start infusion. Warning signs of high block given to the patient including shortness of breath, tingling/numbness in hands, complete motor block, or any  concerning symptoms with instructions to call for help. Patient was given instructions on fall risk and not to get out of bed. All questions and concerns addressed with instructions to call with any issues. 1 Attempt (S) . Patient tolerated procedure well.

## 2019-06-03 NOTE — Progress Notes (Signed)
LABOR PROGRESS NOTE  Michelle Short is a 28 y.o. G2P0010 at [redacted]w[redacted]d admitted for SROM and normal labor.  Subjective: Doing well, epidural placed ~1PM and pain is much improved. Not feeling her contractions. Comfortable with no other concerns at this time.  Objective: BP 124/78   Pulse 71   Temp 98.9 F (37.2 C) (Oral)   Resp 17   Ht 5\' 4"  (1.626 m)   Wt 71.5 kg   LMP 08/26/2018 (Approximate)   SpO2 99%   BMI 27.07 kg/m  or  Vitals:   06/03/19 1400 06/03/19 1430 06/03/19 1500 06/03/19 1530  BP: 115/67 132/84 112/65 124/78  Pulse: 73 100 71 71  Resp: 16 14 16 17   Temp:   98.9 F (37.2 C)   TempSrc:   Oral   SpO2:      Weight:      Height:       Dilation: 3.5 Effacement (%): 70 Cervical Position: Middle Station: -3 Presentation: Vertex Exam by:: Ignacia Felling, RN FHT: Baseline rate 120, moderate varibility, +acels, -decel Toco: Contractions every 2-3 min  Labs: Lab Results  Component Value Date   WBC 7.9 06/03/2019   HGB 11.9 (L) 06/03/2019   HCT 36.9 06/03/2019   MCV 92.5 06/03/2019   PLT 163 06/03/2019    Patient Active Problem List   Diagnosis Date Noted  . Labor and delivery, indication for care 06/03/2019  . Low-lying placenta 01/23/2019  . Supervision of normal pregnancy 10/31/2018  . Common migraine with intractable migraine 09/19/2018  . Cervical cancer screening 04/17/2018  . Seizure ([place]) 01/11/2018  . Marijuana abuse     Assessment / Plan: 28 y.o. G2P0010 at [redacted]w[redacted]d here for SROM and normal labor.  Labor: S/p foley bulb at 1100, contractions q2-3 minutes, will continue to monitor  Fetal Wellbeing:  Category 1 as above Pain Control:  Epidural placed Anticipated MOD:  SVD  Vilma Meckel, MD  Family Medicine, PGY-2 06/03/2019, 5:02 PM

## 2019-06-03 NOTE — MAU Note (Signed)
Pt reports contractions since 0320 every 3-4 mins apart. Denies LOF or vaginal bleeding. Reports good fetal movement. Cervix checked last week and was closed.

## 2019-06-03 NOTE — Anesthesia Preprocedure Evaluation (Signed)
Anesthesia Evaluation  Patient identified by MRN, date of birth, ID band Patient awake    Reviewed: Allergy & Precautions, H&P , NPO status , Patient's Chart, lab work & pertinent test results  Airway Mallampati: II  TM Distance: >3 FB Neck ROM: Full    Dental no notable dental hx. (+) Teeth Intact   Pulmonary neg pulmonary ROS,    Pulmonary exam normal breath sounds clear to auscultation       Cardiovascular Exercise Tolerance: Good negative cardio ROS Normal cardiovascular exam Rhythm:Regular Rate:Normal     Neuro/Psych  Headaches, Seizures -,  Last SZ in Dec on Keppra negative psych ROS   GI/Hepatic negative GI ROS, Neg liver ROS,   Endo/Other  negative endocrine ROS  Renal/GU negative Renal ROS     Musculoskeletal   Abdominal   Peds  Hematology negative hematology ROS (+)   Anesthesia Other Findings   Reproductive/Obstetrics (+) Pregnancy                             Lab Results  Component Value Date   WBC 7.9 06/03/2019   HGB 11.9 (L) 06/03/2019   HCT 36.9 06/03/2019   MCV 92.5 06/03/2019   PLT 163 06/03/2019    Anesthesia Physical Anesthesia Plan  ASA: III  Anesthesia Plan: Epidural   Post-op Pain Management:    Induction:   PONV Risk Score and Plan:   Airway Management Planned:   Additional Equipment:   Intra-op Plan:   Post-operative Plan:   Informed Consent: I have reviewed the patients History and Physical, chart, labs and discussed the procedure including the risks, benefits and alternatives for the proposed anesthesia with the patient or authorized representative who has indicated his/her understanding and acceptance.       Plan Discussed with:   Anesthesia Plan Comments:         Anesthesia Quick Evaluation

## 2019-06-03 NOTE — H&P (Signed)
OBSTETRIC ADMISSION HISTORY AND PHYSICAL  Michelle Short is a 28 y.o. female G2P0010 with IUP at 1527w1d by LMP presenting for spontaneous rupture of membranes. She reports +FMs, no VB, no blurry vision, headaches or peripheral edema, and RUQ pain.  She plans on breast and bottle feeding. She request Depo for birth control. [facility]   Dating: By LMP --->  Estimated Date of Delivery: 06/02/19    [facility]  LAB RESULTS  Language English Pap 08/25/16 neg RCHD  Initiated care at [facility]9wk GC/CT Initial:  -/-          36wks:  Dating by LMP c/w 7wk    Support person Irving CopasAlex Brown Genetics NT/IT:  neg      AFP:         cfDNA:    [place]/HgbE neg  Flu vaccine 01/23/19  CF neg  TDaP vaccine 4/16 SMA   Rhogam       Blood Type O/Positive/-- (12/11 1529)  Anatomy US Isolated EICF, otherwise normal female 'Kane' Antibody Negative (12/11 1529)  Feeding Plan both HBsAg Negative (12/11 1529)  Contraception Depo or pill RPR Non Reactive (12/11 1529)  Circumcision Yes, at FT Rubella  6.82 (12/11 1529)  Pediatrician [facility] HIV Non Reactive (12/11 1529)  Prenatal Classes Info given      GTT/A1C Early:      26-28wks:75/9292  BTL Consent  GBS     POS   [ ]  PCN allergy  VBAC Consent n/a    Waterbirth [ ] Class [ ] Consent [ ] CNM visit PP Needs         Prenatal History/Complications:  Past Medical History: Past Medical History:  Diagnosis Date  . Common migraine with intractable migraine 09/19/2018  . Headache   . Marijuana abuse   . Seizures (HCC) 12/2017    Past Surgical History: History reviewed. No pertinent surgical history.  Obstetrical History: OB History    Gravida  2   Para      Term      Preterm      AB  1   Living        SAB  1   TAB      Ectopic      Multiple      Live Births              Social History: Social History   Socioeconomic History  . Marital status: Single    Spouse name: Irving Copaslex Brown  . Number of children:  0  . Years of education: 4115  . Highest education level: Some college, no degree  Occupational History  . Not on file  Social Needs  . Financial resource strain: Not hard at all  . Food insecurity    Worry: Never true    Inability: Never true  . Transportation needs    Medical: No    Non-medical: No  Tobacco Use  . Smoking status: Never Smoker  . Smokeless tobacco: Never Used  Substance and Sexual Activity  . Alcohol use: No  . Drug use: Not Currently    Types: Marijuana  . Sexual activity: Not Currently    Birth control/protection: None  Lifestyle  . Physical activity    Days per week: 3 days    Minutes per session: 40 min  . Stress: Not at all  Relationships  . Social connections    Talks on phone: More than three times a week    Gets  together: More than three times a week    Attends religious service: More than 4 times per year    Active member of club or organization: No    Attends meetings of clubs or organizations: Never    Relationship status: Never married  Other Topics Concern  . Not on file  Social History Narrative  . Not on file    Family History: Family History  Problem Relation Age of Onset  . Hyperlipidemia Mother   . Diabetes Paternal Grandfather   . Cancer Maternal Grandmother        colon cancer  . Heart attack Maternal Grandfather     Allergies: Allergies  Allergen Reactions  . Ibuprofen Other (See Comments)    "interferes with my seizure medication"  . Mosquito (Diagnostic)     Medications Prior to Admission  Medication Sig Dispense Refill Last Dose  . levETIRAcetam (KEPPRA) 500 MG tablet Take 250 mg by mouth 2 (two) times daily.      . Prenatal Vit-Fe Fumarate-FA (MULTIVITAMIN-PRENATAL) 27-0.8 MG TABS tablet Take 1 tablet by mouth daily at 12 noon.     . promethazine (PHENERGAN) 25 MG tablet Take 1 tablet (25 mg total) by mouth every 6 (six) hours as needed for nausea or vomiting. (Patient not taking: Reported on 12/26/2018) 20 tablet 0       Review of Systems   All systems reviewed and negative except as stated in HPI  Blood pressure (!) 138/92, pulse 88, temperature 98.5 F (36.9 C), temperature source Oral, resp. rate 18, height 5\' 4"  (1.626 m), weight 71.5 kg, last menstrual period 08/26/2018, SpO2 99 %, unknown if currently breastfeeding. General appearance: alert, cooperative and no distress Lungs: clear to auscultation bilaterally Heart: regular rate and rhythm Abdomen: soft, non-tender; bowel sounds normal Pelvic: n/a Extremities: Homans sign is negative, no sign of DVT DTR's +2 Presentation: cephalic Fetal monitoringBaseline: 120 bpm, Variability: Good {> 6 bpm), Accelerations: Reactive and Decelerations: Absent Uterine activity: every 2-4 minutes Dilation: 1 Effacement (%): 60, 70 Station: -2 Exam by:: Adah Perlhandra Phillips RN   Prenatal labs: ABO, Rh: O/Positive/-- (12/11 1529) Antibody: Negative (04/16 0854) Rubella: 6.82 (12/11 1529) RPR: Non Reactive (04/16 0854)  HBsAg: Negative (12/11 1529)  HIV: Non Reactive (04/16 0854)  GBS: Positive (06/17 1700)   Prenatal Transfer Tool  Maternal Diabetes: No Genetic Screening: Normal Maternal Ultrasounds/Referrals: Normal Fetal Ultrasounds or other Referrals:  None Maternal Substance Abuse:  No Significant Maternal Medications:  None Significant Maternal Lab Results: Group B Strep positive  Results for orders placed or performed during the hospital encounter of 06/03/19 (from the past 24 hour(s))  Fern Test   Collection Time: 06/03/19  6:32 AM  Result Value Ref Range   POCT Fern Test Positive = ruptured amniotic membanes   CBC   Collection Time: 06/03/19  6:54 AM  Result Value Ref Range   WBC 7.9 4.0 - 10.5 K/uL   RBC 3.99 3.87 - 5.11 MIL/uL   Hemoglobin 11.9 (L) 12.0 - 15.0 g/dL   HCT 16.136.9 09.636.0 - 04.546.0 %   MCV 92.5 80.0 - 100.0 fL   MCH 29.8 26.0 - 34.0 pg   MCHC 32.2 30.0 - 36.0 g/dL   RDW 40.913.4 81.111.5 - 91.415.5 %   Platelets 163 150 - 400 K/uL    nRBC 0.0 0.0 - 0.2 %    Patient Active Problem List   Diagnosis Date Noted  . Low-lying placenta 01/23/2019  . Supervision of normal pregnancy 10/31/2018  .  Common migraine with intractable migraine 09/19/2018  . Cervical cancer screening 04/17/2018  . Seizure ([place]) 01/11/2018  . Marijuana abuse     Assessment/Plan:  KENSLI BOWLEY is a 28 y.o. G2P0010 at [redacted]w[redacted]d here for spontaneous rupture of membranes  #Labor: Contracting frequently, expectant management for now. Will consider FB and cytotec if no change #Pain: Per patient request #FWB: Cat 1 #ID:  GBS pos, PCN prophylaxis #MOF: Both #MOC: Depo #Circ: Yes at [employer], [place]  06/03/2019, 7:24 AM

## 2019-06-03 NOTE — Progress Notes (Signed)
LABOR PROGRESS NOTE  Michelle Short is a 28 y.o. G2P0010 at [redacted]w[redacted]d  admitted for SROM and normal labor.  Subjective: Patient is doing well breathing through regular contractions. Pain controlled with IV pain medications PRN. No concerns at this time.  Objective: BP 121/76   Pulse 76   Temp 98.3 F (36.8 C) (Oral)   Resp 14   Ht 5\' 4"  (1.626 m)   Wt 71.5 kg   LMP 08/26/2018 (Approximate)   SpO2 99%   BMI 27.07 kg/m  or  Vitals:   06/03/19 0715 06/03/19 0736 06/03/19 0902 06/03/19 1045  BP:  140/82 108/66 121/76  Pulse:  82 77 76  Resp:  14 14   Temp:  98.3 F (36.8 C)    TempSrc:  Oral    SpO2: 99%     Weight:      Height:        Dilation: 1.5 Effacement (%): 60, 70 Cervical Position: Middle Station: -2 Presentation: Vertex Exam by:: Dr. Jearl Klinefelter: Baseline rate 115 (s/p Fentanyl), moderate varibility, +acel, -decel Toco: Contractions q2-3 min  Labs: Lab Results  Component Value Date   WBC 7.9 06/03/2019   HGB 11.9 (L) 06/03/2019   HCT 36.9 06/03/2019   MCV 92.5 06/03/2019   PLT 163 06/03/2019    Patient Active Problem List   Diagnosis Date Noted  . Labor and delivery, indication for care 06/03/2019  . Low-lying placenta 01/23/2019  . Supervision of normal pregnancy 10/31/2018  . Common migraine with intractable migraine 09/19/2018  . Cervical cancer screening 04/17/2018  . Seizure ([place]) 01/11/2018  . Marijuana abuse     Assessment / Plan: 28 y.o. G2P0010 at [redacted]w[redacted]d here for SROM and normal labor.  Labor: Regular contractions; foley bulb placed at 1100 Fetal Wellbeing: Category 1 tracing Pain Control:  IV Fentanyl PRN  Anticipated MOD:  SVD  Vilma Meckel, MD Family Medicine, PGY-2 06/03/2019, 11:21 AM

## 2019-06-03 NOTE — Progress Notes (Signed)
LABOR PROGRESS NOTE Late documentation due to patient care.   Michelle Short is a 28 y.o. G2P0010 at [redacted]w[redacted]d  admitted for SROM.   Subjective: Doing well, comfortable.   Objective: BP 125/75   Pulse 82   Temp 97.9 F (36.6 C) (Axillary)   Resp 14   Ht 5\' 4"  (1.626 m)   Wt 71.5 kg   LMP 08/26/2018 (Approximate)   SpO2 100%   BMI 27.07 kg/m  or  Vitals:   06/03/19 1930 06/03/19 2214 06/03/19 2300 06/03/19 2330  BP:   124/76 125/75  Pulse:   81 82  Resp:      Temp: 98 F (36.7 C) 97.9 F (36.6 C)    TempSrc:  Axillary    SpO2:  99% 100%   Weight:      Height:        Dilation: 6.5 Effacement (%): 80 Cervical Position: Middle Station: -1 Presentation: Vertex Exam by:: Dr. Higinio Plan, Resident FHT: baseline rate 120, moderate varibility, +acel, -decel Toco: every 2-3 min  Labs: Lab Results  Component Value Date   WBC 7.9 06/03/2019   HGB 11.9 (L) 06/03/2019   HCT 36.9 06/03/2019   MCV 92.5 06/03/2019   PLT 163 06/03/2019    Patient Active Problem List   Diagnosis Date Noted  . Labor and delivery, indication for care 06/03/2019  . Low-lying placenta 01/23/2019  . Supervision of normal pregnancy 10/31/2018  . Common migraine with intractable migraine 09/19/2018  . Cervical cancer screening 04/17/2018  . Seizure ([place]) 01/11/2018  . Marijuana abuse     Assessment / Plan: 28 y.o. G2P0010 at [redacted]w[redacted]d here for SROM.   Labor: Progressing well, contracting frequently. Additional leakage of meconium stained fluid during evaluation. Will continue with expectant management. Consider pit +/- IUPC if contractions start to space or minimal change on next evaluation  Fetal Wellbeing:  Cat 1 strip  Pain Control:  Epidural  Anticipated MOD:  SVD   1. Seizures:  No seizures since 10/2018. Well controlled on keppra.  --Cont Keppra 250mg  BID   Darrelyn Hillock, D.O. Family Medicine PGY-2  06/03/2019, 11:42 PM

## 2019-06-04 LAB — ABO/RH: ABO/RH(D): O POS

## 2019-06-04 MED ORDER — DIPHENHYDRAMINE HCL 50 MG/ML IJ SOLN
25.0000 mg | Freq: Once | INTRAMUSCULAR | Status: AC
  Administered 2019-06-04: 25 mg via INTRAVENOUS
  Filled 2019-06-04: qty 1

## 2019-06-04 MED ORDER — TERBUTALINE SULFATE 1 MG/ML IJ SOLN: 0.2500 mg | Freq: Once | INTRAMUSCULAR | Status: DC | PRN

## 2019-06-04 MED ORDER — OXYTOCIN 40 UNITS IN NORMAL SALINE INFUSION - SIMPLE MED
1.0000 m[IU]/min | INTRAVENOUS | Status: DC
  Administered 2019-06-04: 6 m[IU]/min via INTRAVENOUS
  Administered 2019-06-04 (×2): 2 m[IU]/min via INTRAVENOUS

## 2019-06-04 NOTE — Progress Notes (Signed)
Patient ID: GRATIA DISLA, female   DOB: [birth year], 28 y.o.   MRN: [id number] Michelle Short is a 28 y.o. G2P0010 at [redacted]w[redacted]d admitted for PROM  Subjective: Having pain under bilateral ribs w/ uc's  Objective: BP 116/71   Pulse 76   Temp 98.4 F (36.9 C) (Axillary)   Resp 18   Ht 5\' 4"  (1.626 m)   Wt 71.5 kg   LMP 08/26/2018 (Approximate)   SpO2 98%   BMI 27.07 kg/m  No intake/output data recorded.  FHT:  FHR: 145 bpm, variability: moderate,  accelerations:  Present,  decelerations:  Absent UC:   q 2-17mins MVUs 150-200  SVE:   Dilation: 8 Effacement (%): 90 Station: 0(caput at +1) Exam by:: [name], CNM  Pitocin @ 10 mu/min  Labs: Lab Results  Component Value Date   WBC 7.9 06/03/2019   HGB 11.9 (L) 06/03/2019   HCT 36.9 06/03/2019   MCV 92.5 06/03/2019   PLT 163 06/03/2019    Assessment / Plan: IOL d/t PROM 42hrs ago, forebag 30hrs ago, no change in cervix since last exam, MVUs 150-200, lots of caput-unable to tell position. Updated Dr. Harolyn Rutherford who is in [place], will call when she gets out  Labor: protracted Fetal Wellbeing:  Category I Pain Control:  Epidural Pre-eclampsia: n/a I/D:  PCN for GBS+ Anticipated MOD:  TBD  Michelle Short CNM, WHNP-BC 06/04/2019, 11:53 PM   2357: Dr. Harolyn Rutherford on unit, updated of no progression, plan for c/s. Dr. Harolyn Rutherford got called away to stat c/s for another pt  0004: Beginning to have lates again. Pitocin off, lates resolved almost instantly. Updated pt on poc of c/s for FTP, fetal intolerance to pitocin. Dr. Harolyn Rutherford will come to discuss c/s w/ her as soon as she is able to.   Michelle Short, CNM, [facility] 06/05/2019 12:14 AM

## 2019-06-04 NOTE — Progress Notes (Signed)
Patient ID: Michelle Short, female   DOB: [birth year], 28 y.o.   MRN: [id number]  Comfortable with epidural; has been in a variety of positions; ROM >24hr  BP 118/80, P 78, T 98.3 FHR 140s, +accels, occ variables, +SS Ctx q 1-5 mins, some coupling/tripling on Pit 62mu/min Cx 9/C/vtx caput at +1/bones at 0  IUP@term  Active labor/transition ROM>24hr GBS pos  -Plan to check cx in 2 hrs for progress, or sooner prn pressure/urge to push -Anticipate SVD  Myrtis Ser [facility] 06/04/2019 5:54 PM

## 2019-06-04 NOTE — Progress Notes (Signed)
Patient ID: Michelle Short, female   DOB: Jan 19, 1991, 28 y.o.   MRN: [id number] Michelle Short is a 28 y.o. G2P0010 at [redacted]w[redacted]d admitted for PROM  Subjective: Comfortable w/ epidural, no complaints, no pressure  Objective: BP 139/89   Pulse 84   Temp 98.2 F (36.8 C) (Oral)   Resp 17   Ht 5\' 4"  (1.626 m)   Wt 71.5 kg   LMP 08/26/2018 (Approximate)   SpO2 98%   BMI 27.07 kg/m  No intake/output data recorded.  FHT:  FHR: 130 bpm, variability: moderate,  accelerations:  Present,  decelerations:  Absent UC:   q 4-31mins  SVE:   Dilation: 8 Effacement (%): 90 Station: 0(caput at +1) Exam by:: Michelle Short, CNM  Good scalp stim   Pitocin @ 2 mu/min (restarted @ 2100) after turned off earlier d/t repetitive lates  Labs: Lab Results  Component Value Date   WBC 7.9 06/03/2019   HGB 11.9 (L) 06/03/2019   HCT 36.9 06/03/2019   MCV 92.5 06/03/2019   PLT 163 06/03/2019    Assessment / Plan: IOL d/t PROM 7/13 @ 0600, forebag 7/13 @ 1730 mod MSF, slowly progressed throught the day to 9cm, pit was d/c'd d/t repetitive lates, back on for almost an hour now, Cat 1FHR, cx swelling some, will give IV benadryl, IUPC replaced, turned Rt exaggerated sims w/ peanut ball. Updated Dr. Harolyn Short  Labor: slow Fetal Wellbeing:  Category I Pain Control:  Epidural Pre-eclampsia: n/a I/D:  PCN for GBS+ Anticipated MOD:  TBD  Michelle Short CNM, WHNP-BC 06/04/2019, 9:42 PM

## 2019-06-04 NOTE — Progress Notes (Signed)
LABOR PROGRESS NOTE  Michelle Short is a 28 y.o. G2P0010 at [redacted]w[redacted]d admitted for SROM.  Subjective: Resting and comfortable. No concerns at this time.  Objective: BP 117/69   Pulse 79   Temp 98.4 F (36.9 C) (Axillary)   Resp 18   Ht 5\' 4"  (1.626 m)   Wt 71.5 kg   LMP 08/26/2018 (Approximate)   SpO2 98%   BMI 27.07 kg/m  or  Vitals:   06/04/19 1301 06/04/19 1407 06/04/19 1431 06/04/19 1501  BP: 113/73  123/76 117/69  Pulse: 73  73 79  Resp: 16  18   Temp:  98.4 F (36.9 C)    TempSrc:  Axillary    SpO2:      Weight:      Height:       Dilation: 8 Effacement (%): 100 Cervical Position: Anterior Station: 0 Presentation: Vertex Exam by:: Dr. Jearl Klinefelter: Baseline rate 145-150, moderate varibility, +accels, occasional variable/late decels Toco: Contractions every 2 minutes   Labs: Lab Results  Component Value Date   WBC 7.9 06/03/2019   HGB 11.9 (L) 06/03/2019   HCT 36.9 06/03/2019   MCV 92.5 06/03/2019   PLT 163 06/03/2019    Patient Active Problem List   Diagnosis Date Noted  . Labor and delivery, indication for care 06/03/2019  . Low-lying placenta 01/23/2019  . Supervision of normal pregnancy 10/31/2018  . Common migraine with intractable migraine 09/19/2018  . Cervical cancer screening 04/17/2018  . Seizure ([place]) 01/11/2018  . Marijuana abuse     Assessment / Plan: 28 y.o. G2P0010 at [redacted]w[redacted]d here for SROM.  Labor: Progressing; continue Pitocin per protocol. Will recheck in 1-2 hours. Fetal Wellbeing:  Category 2 tracing as above; will continue to monitor. Pain Control:  Epidural in place Anticipated MOD:  SVD  Vilma Meckel, MD Family Medicine, PGY-2 06/04/2019, 3:40 PM

## 2019-06-04 NOTE — Plan of Care (Signed)

## 2019-06-04 NOTE — Progress Notes (Signed)
LABOR PROGRESS NOTE  Michelle Short is a 28 y.o. G2P0010 at [redacted]w[redacted]d  admitted for SROM.  Subjective: No concerns, pain is well-controlled. Excited to meet her baby.  Objective: BP 125/83   Pulse 83   Temp 97.7 F (36.5 C) (Axillary)   Resp 17   Ht 5\' 4"  (1.626 m)   Wt 71.5 kg   LMP 08/26/2018 (Approximate)   SpO2 98%   BMI 27.07 kg/m  or  Vitals:   06/04/19 0705 06/04/19 0831 06/04/19 0903 06/04/19 0908  BP:  127/81 125/83   Pulse:  71 83   Resp:  18 17   Temp: 98.7 F (37.1 C)   97.7 F (36.5 C)  TempSrc: Oral   Axillary  SpO2:      Weight:      Height:       Dilation: 6 Effacement (%): 90 Cervical Position: Middle Station: -1 Presentation: Vertex Exam by:: Dr. Vilma Meckel FHT: Baseline rate 120, moderate varibility, +accel, -decel Toco: Contractions ~56min with some coupling  Labs: Lab Results  Component Value Date   WBC 7.9 06/03/2019   HGB 11.9 (L) 06/03/2019   HCT 36.9 06/03/2019   MCV 92.5 06/03/2019   PLT 163 06/03/2019    Patient Active Problem List   Diagnosis Date Noted  . Labor and delivery, indication for care 06/03/2019  . Low-lying placenta 01/23/2019  . Supervision of normal pregnancy 10/31/2018  . Common migraine with intractable migraine 09/19/2018  . Cervical cancer screening 04/17/2018  . Seizure ([place]) 01/11/2018  . Marijuana abuse     Assessment / Plan: 28 y.o. G2P0010 at [redacted]w[redacted]d here for SROM.  Labor:  Minimal progress from last check; will increase Pitocin per protocol (now at 14).  Fetal Wellbeing:  Category 1 tracing Pain Control:  Epidural Anticipated MOD:  SVD  Vilma Meckel, MD  Family Medicine, PGY-2 06/04/2019, 10:31 AM

## 2019-06-04 NOTE — Progress Notes (Signed)
LABOR PROGRESS NOTE  Michelle Short is a 28 y.o. G2P0010 at [redacted]w[redacted]d  admitted for SROM.   Subjective: Doing well, having some heartburn. Not feeling contractions.   Objective: BP 127/89   Pulse 75   Temp 98.5 F (36.9 C) (Axillary)   Resp 19   Ht 5\' 4"  (1.626 m)   Wt 71.5 kg   LMP 08/26/2018 (Approximate)   SpO2 99%   BMI 27.07 kg/m  or  Vitals:   06/04/19 0140 06/04/19 0145 06/04/19 0150 06/04/19 0200  BP:    127/89  Pulse:    75  Resp:    19  Temp:      TempSrc:      SpO2: 94% 95% 98% 99%  Weight:      Height:        Dilation: 6.5 Effacement (%): 80 Cervical Position: Middle Station: -1 Presentation: Vertex Exam by:: Dr. Higinio Plan, Resident  FHT: baseline rate 120, moderate varibility, +acel, -decel Toco: every 3-5 min   Labs: Lab Results  Component Value Date   WBC 7.9 06/03/2019   HGB 11.9 (L) 06/03/2019   HCT 36.9 06/03/2019   MCV 92.5 06/03/2019   PLT 163 06/03/2019    Patient Active Problem List   Diagnosis Date Noted  . Labor and delivery, indication for care 06/03/2019  . Low-lying placenta 01/23/2019  . Supervision of normal pregnancy 10/31/2018  . Common migraine with intractable migraine 09/19/2018  . Cervical cancer screening 04/17/2018  . Seizure ([place]) 01/11/2018  . Marijuana abuse     Assessment / Plan: 28 y.o. G2P0010 at [redacted]w[redacted]d here for SROM.   Labor: Minimal change since previous evaluation. Will start pit 2x2 and titrate as tolerated. Placed IUPC at 0200 given difficulty tracing contractions.  Fetal Wellbeing: Cat 1 Pain Control:  Epidural placed  Anticipated MOD:  SVD   Darrelyn Hillock, D.O. Family Medicine PGY-2  06/04/2019, 2:17 AM

## 2019-06-04 NOTE — Progress Notes (Signed)
LABOR PROGRESS NOTE  Michelle Short is a 28 y.o. G2P0010 at [redacted]w[redacted]d  admitted for SROM.   Subjective: Sleeping prior to evaluation, comfortable.    Objective: BP 118/69   Pulse 75   Temp 98.5 F (36.9 C) (Oral)   Resp 17   Ht 5\' 4"  (1.626 m)   Wt 71.5 kg   LMP 08/26/2018 (Approximate)   SpO2 97%   BMI 27.07 kg/m  or  Vitals:   06/04/19 0600 06/04/19 0601 06/04/19 0630 06/04/19 0631  BP:  126/67  118/69  Pulse:  72  75  Resp:  18  17  Temp:      TempSrc:      SpO2: 97%  97%   Weight:      Height:         Dilation: 6.5 Effacement (%): 80 Cervical Position: Middle Station: -1 Presentation: Vertex Exam by:: Dr. Higinio Plan, Resident  FHT: baseline rate 120, moderate varibility, +acel, -decel Toco: Every 2-7 min   Labs: Lab Results  Component Value Date   WBC 7.9 06/03/2019   HGB 11.9 (L) 06/03/2019   HCT 36.9 06/03/2019   MCV 92.5 06/03/2019   PLT 163 06/03/2019    Patient Active Problem List   Diagnosis Date Noted  . Labor and delivery, indication for care 06/03/2019  . Low-lying placenta 01/23/2019  . Supervision of normal pregnancy 10/31/2018  . Common migraine with intractable migraine 09/19/2018  . Cervical cancer screening 04/17/2018  . Seizure ([place]) 01/11/2018  . Marijuana abuse     Assessment / Plan: 28 y.o. G2P0010 at [redacted]w[redacted]d here for SROM.   Labor: Minimal progression since last evaluation. Will cont to titrate pit as tolerated, currently at 10 (will go up to 12 now). Adequate MVUs since 0500 however with contraction coupling, will trial position changes.  Fetal Wellbeing:  Cat 1  Pain Control:  Epidural placed  Anticipated MOD:  SVD   Darrelyn Hillock, D.O. Family Medicine PGY-2  06/04/2019, 6:58 AM

## 2019-06-04 NOTE — Progress Notes (Signed)
LABOR PROGRESS NOTE  Michelle Short is a 28 y.o. G2P0010 at [redacted]w[redacted]d  admitted for SROM.  Subjective: Sleeping comfortably, no complaints at this time.  Objective: BP 113/73   Pulse 73   Temp 97.6 F (36.4 C) (Oral)   Resp 16   Ht 5\' 4"  (1.626 m)   Wt 71.5 kg   LMP 08/26/2018 (Approximate)   SpO2 98%   BMI 27.07 kg/m  or  Vitals:   06/04/19 1131 06/04/19 1201 06/04/19 1231 06/04/19 1301  BP: (!) 103/56 (!) 98/54 108/77 113/73  Pulse: 74 75  73  Resp: 18 17 17 16   Temp:      TempSrc:      SpO2:      Weight:      Height:       Dilation: 7 Effacement (%): 100 Cervical Position: Anterior Station: 0 Presentation: Vertex Exam by:: Dr. Jearl Klinefelter: Baseline rate 135, moderate varibility, +acel, -decel Toco: Contractions every 2 minutes  Labs: Lab Results  Component Value Date   WBC 7.9 06/03/2019   HGB 11.9 (L) 06/03/2019   HCT 36.9 06/03/2019   MCV 92.5 06/03/2019   PLT 163 06/03/2019    Patient Active Problem List   Diagnosis Date Noted  . Labor and delivery, indication for care 06/03/2019  . Low-lying placenta 01/23/2019  . Supervision of normal pregnancy 10/31/2018  . Common migraine with intractable migraine 09/19/2018  . Cervical cancer screening 04/17/2018  . Seizure ([place]) 01/11/2018  . Marijuana abuse     Assessment / Plan: 28 y.o. G2P0010 at [redacted]w[redacted]d here for SROM.  Labor:  Progressing well; will continue Pitocin per protocol.  Fetal Wellbeing:  Category 1 tracing as above Pain Control:  Epidural in place Anticipated MOD:  SVD  Vilma Meckel, MD  Family Medicine, PGY-2 06/04/2019, 1:41 PM

## 2019-06-05 ENCOUNTER — Encounter (HOSPITAL_COMMUNITY): Payer: Self-pay | Admitting: Anesthesiology

## 2019-06-05 ENCOUNTER — Encounter: Payer: Medicaid Other | Admitting: Obstetrics and Gynecology

## 2019-06-05 ENCOUNTER — Encounter (HOSPITAL_COMMUNITY)
Admission: AD | Disposition: A | Discharge status: Home / Self Care | Payer: Self-pay | Source: Home / Self Care | Attending: Obstetrics & Gynecology

## 2019-06-05 DIAGNOSIS — O324XX0 Maternal care for high head at term, not applicable or unspecified: Secondary | ICD-10-CM

## 2019-06-05 DIAGNOSIS — Z3A40 40 weeks gestation of pregnancy: Secondary | ICD-10-CM

## 2019-06-05 DIAGNOSIS — O99824 Streptococcus B carrier state complicating childbirth: Secondary | ICD-10-CM

## 2019-06-05 DIAGNOSIS — O480 Post-term pregnancy: Secondary | ICD-10-CM

## 2019-06-05 SURGERY — Surgical Case
Anesthesia: Epidural

## 2019-06-05 MED ORDER — SIMETHICONE 80 MG PO CHEW
80.0000 mg | CHEWABLE_TABLET | ORAL | Status: DC | PRN
  Administered 2019-06-06 – 2019-06-07 (×2): 80 mg via ORAL
  Filled 2019-06-05 (×2): qty 1

## 2019-06-05 MED ORDER — ENOXAPARIN SODIUM 40 MG/0.4ML ~~LOC~~ SOLN
40.0000 mg | SUBCUTANEOUS | Status: DC
  Administered 2019-06-05 – 2019-06-08 (×3): 40 mg via SUBCUTANEOUS
  Filled 2019-06-05 (×3): qty 0.4

## 2019-06-05 MED ORDER — SENNOSIDES-DOCUSATE SODIUM 8.6-50 MG PO TABS
2.0000 | ORAL_TABLET | ORAL | Status: DC
  Administered 2019-06-06 – 2019-06-08 (×3): 2 via ORAL
  Filled 2019-06-05 (×3): qty 2

## 2019-06-05 MED ORDER — DEXAMETHASONE SODIUM PHOSPHATE 10 MG/ML IJ SOLN
INTRAMUSCULAR | Status: AC
  Filled 2019-06-05: qty 1

## 2019-06-05 MED ORDER — DIPHENHYDRAMINE HCL 25 MG PO CAPS: 25.0000 mg | ORAL_CAPSULE | Freq: Four times a day (QID) | ORAL | Status: DC | PRN

## 2019-06-05 MED ORDER — NALBUPHINE HCL 10 MG/ML IJ SOLN: 5.0000 mg | Freq: Once | INTRAMUSCULAR | Status: DC | PRN

## 2019-06-05 MED ORDER — ACETAMINOPHEN 10 MG/ML IV SOLN
INTRAVENOUS | Status: AC
  Filled 2019-06-05: qty 100

## 2019-06-05 MED ORDER — MEPERIDINE HCL 25 MG/ML IJ SOLN: 6.2500 mg | INTRAMUSCULAR | Status: DC | PRN

## 2019-06-05 MED ORDER — SCOPOLAMINE 1 MG/3DAYS TD PT72: 1.0000 | MEDICATED_PATCH | Freq: Once | TRANSDERMAL | Status: DC

## 2019-06-05 MED ORDER — SODIUM BICARBONATE 8.4 % IV SOLN
INTRAVENOUS | Status: DC | PRN
  Administered 2019-06-05: 5 mL via EPIDURAL
  Administered 2019-06-05: 2 mL via EPIDURAL

## 2019-06-05 MED ORDER — TETANUS-DIPHTH-ACELL PERTUSSIS 5-2.5-18.5 LF-MCG/0.5 IM SUSP: 0.5000 mL | Freq: Once | INTRAMUSCULAR | Status: DC

## 2019-06-05 MED ORDER — OXYTOCIN 40 UNITS IN NORMAL SALINE INFUSION - SIMPLE MED
INTRAVENOUS | Status: AC
  Filled 2019-06-05: qty 1000

## 2019-06-05 MED ORDER — KETOROLAC TROMETHAMINE 30 MG/ML IJ SOLN: 30.0000 mg | Freq: Four times a day (QID) | INTRAMUSCULAR | Status: DC | PRN

## 2019-06-05 MED ORDER — SODIUM CHLORIDE 0.9 % IV SOLN
2.0000 g | INTRAVENOUS | Status: AC
  Administered 2019-06-05: 2 g via INTRAVENOUS

## 2019-06-05 MED ORDER — LEVETIRACETAM 500 MG PO TABS
500.0000 mg | ORAL_TABLET | Freq: Two times a day (BID) | ORAL | Status: DC
  Filled 2019-06-05: qty 1

## 2019-06-05 MED ORDER — DIPHENHYDRAMINE HCL 25 MG PO CAPS: 25.0000 mg | ORAL_CAPSULE | ORAL | Status: DC | PRN

## 2019-06-05 MED ORDER — HYDROCODONE-ACETAMINOPHEN 7.5-325 MG PO TABS
ORAL_TABLET | ORAL | Status: AC
  Filled 2019-06-05: qty 1

## 2019-06-05 MED ORDER — NALBUPHINE HCL 10 MG/ML IJ SOLN: 5.0000 mg | INTRAMUSCULAR | Status: DC | PRN

## 2019-06-05 MED ORDER — SODIUM CHLORIDE 0.9% FLUSH: 3.0000 mL | INTRAVENOUS | Status: DC | PRN

## 2019-06-05 MED ORDER — SIMETHICONE 80 MG PO CHEW
80.0000 mg | CHEWABLE_TABLET | ORAL | Status: DC
  Administered 2019-06-06 – 2019-06-08 (×3): 80 mg via ORAL
  Filled 2019-06-05 (×3): qty 1

## 2019-06-05 MED ORDER — COCONUT OIL OIL: 1.0000 "application " | TOPICAL_OIL | Status: DC | PRN

## 2019-06-05 MED ORDER — GABAPENTIN 300 MG PO CAPS
300.0000 mg | ORAL_CAPSULE | Freq: Two times a day (BID) | ORAL | Status: DC
  Administered 2019-06-05 – 2019-06-08 (×7): 300 mg via ORAL
  Filled 2019-06-05 (×7): qty 1

## 2019-06-05 MED ORDER — CEFOTETAN DISODIUM-DEXTROSE 2-2.08 GM-%(50ML) IV SOLR
INTRAVENOUS | Status: AC
  Filled 2019-06-05: qty 50

## 2019-06-05 MED ORDER — OXYCODONE-ACETAMINOPHEN 5-325 MG PO TABS: 1.0000 | ORAL_TABLET | ORAL | Status: DC | PRN

## 2019-06-05 MED ORDER — SODIUM CHLORIDE 0.9 % IR SOLN
Status: DC | PRN
  Administered 2019-06-05: 1

## 2019-06-05 MED ORDER — TRAMADOL HCL 50 MG PO TABS
50.0000 mg | ORAL_TABLET | Freq: Four times a day (QID) | ORAL | Status: DC | PRN
  Administered 2019-06-05 – 2019-06-08 (×7): 50 mg via ORAL
  Filled 2019-06-05 (×7): qty 1

## 2019-06-05 MED ORDER — NALOXONE HCL 0.4 MG/ML IJ SOLN: 0.4000 mg | INTRAMUSCULAR | Status: DC | PRN

## 2019-06-05 MED ORDER — METHYLERGONOVINE MALEATE 0.2 MG/ML IJ SOLN
INTRAMUSCULAR | Status: AC
  Filled 2019-06-05: qty 1

## 2019-06-05 MED ORDER — HYDROMORPHONE HCL 1 MG/ML IJ SOLN
0.2500 mg | INTRAMUSCULAR | Status: DC | PRN
  Administered 2019-06-05: 0.5 mg via INTRAVENOUS

## 2019-06-05 MED ORDER — DIPHENHYDRAMINE HCL 50 MG/ML IJ SOLN: 12.5000 mg | INTRAMUSCULAR | Status: DC | PRN

## 2019-06-05 MED ORDER — MAGNESIUM HYDROXIDE 400 MG/5ML PO SUSP: 30.0000 mL | ORAL | Status: DC | PRN

## 2019-06-05 MED ORDER — ZOLPIDEM TARTRATE 5 MG PO TABS: 5.0000 mg | ORAL_TABLET | Freq: Every evening | ORAL | Status: DC | PRN

## 2019-06-05 MED ORDER — OXYTOCIN 40 UNITS IN NORMAL SALINE INFUSION - SIMPLE MED: 2.5000 [IU]/h | INTRAVENOUS | Status: AC

## 2019-06-05 MED ORDER — SODIUM CHLORIDE 0.9 % IV SOLN
INTRAVENOUS | Status: DC | PRN
  Administered 2019-06-05: 02:00:00 via INTRAVENOUS

## 2019-06-05 MED ORDER — DIBUCAINE (PERIANAL) 1 % EX OINT
1.0000 "application " | TOPICAL_OINTMENT | CUTANEOUS | Status: DC | PRN
  Administered 2019-06-06: 1 via RECTAL
  Filled 2019-06-05: qty 28

## 2019-06-05 MED ORDER — MENTHOL 3 MG MT LOZG: 1.0000 | LOZENGE | OROMUCOSAL | Status: DC | PRN

## 2019-06-05 MED ORDER — MEASLES, MUMPS & RUBELLA VAC IJ SOLR: 0.5000 mL | Freq: Once | INTRAMUSCULAR | Status: DC

## 2019-06-05 MED ORDER — LIDOCAINE-EPINEPHRINE (PF) 2 %-1:200000 IJ SOLN
INTRAMUSCULAR | Status: AC
  Filled 2019-06-05: qty 10

## 2019-06-05 MED ORDER — DEXAMETHASONE SODIUM PHOSPHATE 10 MG/ML IJ SOLN
INTRAMUSCULAR | Status: DC | PRN
  Administered 2019-06-05: 10 mg via INTRAVENOUS

## 2019-06-05 MED ORDER — LACTATED RINGERS IV SOLN
INTRAVENOUS | Status: DC | PRN
  Administered 2019-06-05: 02:00:00 via INTRAVENOUS

## 2019-06-05 MED ORDER — PRENATAL MULTIVITAMIN CH
1.0000 | ORAL_TABLET | Freq: Every day | ORAL | Status: DC
  Administered 2019-06-05 – 2019-06-07 (×3): 1 via ORAL
  Filled 2019-06-05 (×4): qty 1

## 2019-06-05 MED ORDER — HYDROCODONE-ACETAMINOPHEN 7.5-325 MG PO TABS
1.0000 | ORAL_TABLET | Freq: Once | ORAL | Status: AC | PRN
  Administered 2019-06-05: 1 via ORAL

## 2019-06-05 MED ORDER — MORPHINE SULFATE (PF) 0.5 MG/ML IJ SOLN
INTRAMUSCULAR | Status: AC
  Filled 2019-06-05: qty 10

## 2019-06-05 MED ORDER — FENTANYL CITRATE (PF) 100 MCG/2ML IJ SOLN
INTRAMUSCULAR | Status: AC
  Filled 2019-06-05: qty 2

## 2019-06-05 MED ORDER — FENTANYL CITRATE (PF) 100 MCG/2ML IJ SOLN
INTRAMUSCULAR | Status: DC | PRN
  Administered 2019-06-05: 100 ug via EPIDURAL

## 2019-06-05 MED ORDER — ACETAMINOPHEN 10 MG/ML IV SOLN
1000.0000 mg | Freq: Once | INTRAVENOUS | Status: DC | PRN
  Administered 2019-06-05: 1000 mg via INTRAVENOUS

## 2019-06-05 MED ORDER — HYDROMORPHONE HCL 1 MG/ML IJ SOLN
INTRAMUSCULAR | Status: AC
  Filled 2019-06-05: qty 0.5

## 2019-06-05 MED ORDER — METHYLERGONOVINE MALEATE 0.2 MG/ML IJ SOLN
INTRAMUSCULAR | Status: DC | PRN
  Administered 2019-06-05: 0.2 mg via INTRAMUSCULAR

## 2019-06-05 MED ORDER — NALOXONE HCL 4 MG/10ML IJ SOLN
1.0000 ug/kg/h | INTRAVENOUS | Status: DC | PRN
  Filled 2019-06-05: qty 5

## 2019-06-05 MED ORDER — ONDANSETRON HCL 4 MG/2ML IJ SOLN: 4.0000 mg | Freq: Once | INTRAMUSCULAR | Status: DC | PRN

## 2019-06-05 MED ORDER — LEVETIRACETAM 250 MG PO TABS
250.0000 mg | ORAL_TABLET | Freq: Two times a day (BID) | ORAL | Status: DC
  Administered 2019-06-05 – 2019-06-08 (×7): 250 mg via ORAL
  Filled 2019-06-05 (×8): qty 1

## 2019-06-05 MED ORDER — ONDANSETRON HCL 4 MG/2ML IJ SOLN: 4.0000 mg | Freq: Three times a day (TID) | INTRAMUSCULAR | Status: DC | PRN

## 2019-06-05 MED ORDER — OXYCODONE-ACETAMINOPHEN 5-325 MG PO TABS: 2.0000 | ORAL_TABLET | ORAL | Status: DC | PRN

## 2019-06-05 MED ORDER — ONDANSETRON HCL 4 MG/2ML IJ SOLN
INTRAMUSCULAR | Status: AC
  Filled 2019-06-05: qty 2

## 2019-06-05 MED ORDER — SODIUM CHLORIDE 0.9 % IV SOLN
INTRAVENOUS | Status: DC | PRN
  Administered 2019-06-05: 40 [IU] via INTRAVENOUS

## 2019-06-05 MED ORDER — FERROUS SULFATE 325 (65 FE) MG PO TABS
325.0000 mg | ORAL_TABLET | Freq: Two times a day (BID) | ORAL | Status: DC
  Administered 2019-06-05 – 2019-06-08 (×6): 325 mg via ORAL
  Filled 2019-06-05 (×6): qty 1

## 2019-06-05 MED ORDER — ACETAMINOPHEN 500 MG PO TABS
1000.0000 mg | ORAL_TABLET | Freq: Four times a day (QID) | ORAL | Status: DC | PRN
  Administered 2019-06-05 – 2019-06-07 (×6): 1000 mg via ORAL
  Filled 2019-06-05 (×6): qty 2

## 2019-06-05 MED ORDER — SODIUM CHLORIDE (PF) 0.9 % IJ SOLN
INTRAMUSCULAR | Status: DC | PRN
  Administered 2019-06-03: 12 mL/h via EPIDURAL

## 2019-06-05 MED ORDER — HYDROMORPHONE HCL 1 MG/ML IJ SOLN: 1.0000 mg | INTRAMUSCULAR | Status: DC | PRN

## 2019-06-05 MED ORDER — LACTATED RINGERS IV SOLN
INTRAVENOUS | Status: DC
  Administered 2019-06-05: 13:00:00 via INTRAVENOUS

## 2019-06-05 MED ORDER — WITCH HAZEL-GLYCERIN EX PADS
1.0000 "application " | MEDICATED_PAD | CUTANEOUS | Status: DC | PRN
  Administered 2019-06-06: 1 via TOPICAL

## 2019-06-05 SURGICAL SUPPLY — 34 items
CHLORAPREP W/TINT 26ML (MISCELLANEOUS) ×3 IMPLANT
CLAMP CORD UMBIL (MISCELLANEOUS) IMPLANT
CLOTH BEACON ORANGE TIMEOUT ST (SAFETY) ×3 IMPLANT
DRSG OPSITE POSTOP 4X10 (GAUZE/BANDAGES/DRESSINGS) ×3 IMPLANT
ELECT REM PT RETURN 9FT ADLT (ELECTROSURGICAL) ×3
ELECTRODE REM PT RTRN 9FT ADLT (ELECTROSURGICAL) ×1 IMPLANT
EXTRACTOR VACUUM M CUP 4 TUBE (SUCTIONS) IMPLANT
EXTRACTOR VACUUM M CUP 4' TUBE (SUCTIONS)
GLOVE BIOGEL PI IND STRL 7.0 (GLOVE) ×3 IMPLANT
GLOVE BIOGEL PI INDICATOR 7.0 (GLOVE) ×6
GLOVE ECLIPSE 7.0 STRL STRAW (GLOVE) ×3 IMPLANT
GOWN STRL REUS W/TWL LRG LVL3 (GOWN DISPOSABLE) ×6 IMPLANT
HEMOSTAT SURGICEL 2X3 (HEMOSTASIS) ×3 IMPLANT
KIT ABG SYR 3ML LUER SLIP (SYRINGE) IMPLANT
NEEDLE HYPO 22GX1.5 SAFETY (NEEDLE) ×3 IMPLANT
NEEDLE HYPO 25X5/8 SAFETYGLIDE (NEEDLE) ×3 IMPLANT
NS IRRIG 1000ML POUR BTL (IV SOLUTION) ×3 IMPLANT
PACK C SECTION WH (CUSTOM PROCEDURE TRAY) ×3 IMPLANT
PAD ABD 7.5X8 STRL (GAUZE/BANDAGES/DRESSINGS) ×3 IMPLANT
PAD OB MATERNITY 4.3X12.25 (PERSONAL CARE ITEMS) ×3 IMPLANT
PENCIL SMOKE EVAC W/HOLSTER (ELECTROSURGICAL) ×3 IMPLANT
RTRCTR C-SECT PINK 25CM LRG (MISCELLANEOUS) IMPLANT
SPONGE GAUZE 4X4 12PLY STER LF (GAUZE/BANDAGES/DRESSINGS) ×6 IMPLANT
SUT PDS AB 0 CTX 36 PDP370T (SUTURE) IMPLANT
SUT PLAIN 2 0 XLH (SUTURE) IMPLANT
SUT VIC AB 0 CTX 36 (SUTURE) ×4
SUT VIC AB 0 CTX36XBRD ANBCTRL (SUTURE) ×2 IMPLANT
SUT VIC AB 2-0 CT1 27 (SUTURE) ×2
SUT VIC AB 2-0 CT1 TAPERPNT 27 (SUTURE) ×1 IMPLANT
SUT VIC AB 4-0 KS 27 (SUTURE) ×3 IMPLANT
SYR CONTROL 10ML LL (SYRINGE) ×3 IMPLANT
TOWEL OR 17X24 6PK STRL BLUE (TOWEL DISPOSABLE) ×3 IMPLANT
TRAY FOLEY W/BAG SLVR 14FR LF (SET/KITS/TRAYS/PACK) ×3 IMPLANT
WATER STERILE IRR 1000ML POUR (IV SOLUTION) ×6 IMPLANT

## 2019-06-05 NOTE — Anesthesia Postprocedure Evaluation (Signed)
Anesthesia Post Note  Patient: Michelle Short  Procedure(s) Performed: CESAREAN SECTION (N/A )     Patient location during evaluation: Mother Baby Anesthesia Type: Epidural Level of consciousness: oriented and awake and alert Pain management: pain level controlled Vital Signs Assessment: post-procedure vital signs reviewed and stable Respiratory status: spontaneous breathing and respiratory function stable Cardiovascular status: blood pressure returned to baseline and stable Postop Assessment: no headache, no backache, no apparent nausea or vomiting and able to ambulate Anesthetic complications: no    Last Vitals:  Vitals:   06/05/19 0345 06/05/19 0400  BP: (!) 120/95 121/87  Pulse: 76 85  Resp: 16 20  Temp:    SpO2: 100% 100%    Last Pain:  Vitals:   06/05/19 0358  TempSrc:   PainSc: 8    Pain Goal: Patients Stated Pain Goal: 9 (06/04/19 1014)              Epidural/Spinal Function Cutaneous sensation: Able to Wiggle Toes (06/05/19 0400), Patient able to flex knees: Yes (06/05/19 0400), Patient able to lift hips off bed: Yes (06/05/19 0400), Back pain beyond tenderness at insertion site: No (06/05/19 0400), Progressively worsening motor and/or sensory loss: No (06/05/19 0400), Bowel and/or bladder incontinence post epidural: No (06/05/19 0400)  Barnet Glasgow

## 2019-06-05 NOTE — Discharge Summary (Signed)
Postpartum Discharge Summary     Patient Name: Michelle Short DOB: Nov 11, 1991 MRN: [id number]  Date of admission: 06/03/2019 Delivering Provider: Verita Schneiders A   Date of discharge: 06/08/2019  Admitting diagnosis: 40WKS CTX Intrauterine pregnancy: [redacted]w[redacted]d     Secondary diagnosis:  Principal Problem:   S/P cesarean section for FTP Active Problems:   Supervision of normal pregnancy  Additional problems: failure to progress, fetal intolerance to pitocin, prolonged ROM     Discharge diagnosis: Term Pregnancy Delivered                                                                                                Post partum procedures:None  Augmentation: AROM, Pitocin and Foley Balloon  Complications: IRW>43 hours  Hospital course:  Induction of Labor With Cesarean Section  28 y.o. yo G2P0010 at [redacted]w[redacted]d was admitted to the hospital 06/03/2019 for induction of labor. Patient had a labor course significant for IOL d/t PROM, received foley bulb, then pitocin. She slowly progressed to 8cm then labor stalled and would have repetitive lates w/ pitocin increase. The patient went for cesarean section due to FTP/fetal intolerance to pitocin, and delivered a Viable infant,06/05/2019  Membrane Rupture Time/Date: 5:48 AM ,06/03/2019    Details of operation can be found in separate operative Note.  Patient had an uncomplicated postpartum course. She is ambulating, tolerating a regular diet, passing flatus, and urinating well.  Patient is discharged home in stable condition on 06/08/19.  She reports that she is unsure if baby will be going home today.  She requests oral contraception for birth control method.                        Magnesium Sulfate recieved: No BMZ received: No  Physical exam  Vitals:   06/06/19 2257 06/07/19 0537 06/08/19 0015 06/08/19 0601  BP: 119/76 124/90 119/86 118/82  Pulse: 85 86 82 69  Resp: 16 15 18 18   Temp: 98.4 F (36.9 C) 97.8 F (36.6 C) 98 F (36.7 C) 97.9  F (36.6 C)  TempSrc: Oral Oral Oral Oral  SpO2: 100% 100% 100% 100%  Weight:      Height:       General: alert, cooperative and no distress Lochia: appropriate Uterine Fundus: firm Incision: Healing well with no significant drainage, No significant erythema, Dressing is clean, dry, and intact DVT Evaluation: mild Calf/Ankle edema is present Labs: Lab Results  Component Value Date   WBC 11.5 (H) 06/06/2019   HGB 8.4 (L) 06/06/2019   HCT 26.5 (L) 06/06/2019   MCV 92.7 06/06/2019   PLT 172 06/06/2019   CMP Latest Ref Rng & Units 06/06/2019  Glucose 70 - 99 mg/dL -  BUN 6 - 20 mg/dL -  Creatinine 0.44 - 1.00 mg/dL 1.01(H)  Sodium 135 - 145 mmol/L -  Potassium 3.5 - 5.1 mmol/L -  Chloride 98 - 111 mmol/L -  CO2 22 - 32 mmol/L -  Calcium 8.9 - 10.3 mg/dL -  Total Protein 6.5 - 8.1 g/dL -  Total Bilirubin 0.3 - 1.2 mg/dL -  Alkaline Phos 38 - 126 U/L -  AST 15 - 41 U/L -  ALT 0 - 44 U/L -    Discharge instruction: per After Visit Summary and "Baby and Me Booklet".  After visit meds:  Allergies as of 06/08/2019      Reactions   Ibuprofen Other (See Comments)   "interferes with my seizure medication"      Medication List    TAKE these medications   acetaminophen 500 MG tablet Commonly known as: TYLENOL Take 2 tablets (1,000 mg total) by mouth every 6 (six) hours as needed for moderate pain.   levETIRAcetam 500 MG tablet Commonly known as: KEPPRA Take 250 mg by mouth 2 (two) times daily.   multivitamin-prenatal 27-0.8 MG Tabs tablet Take 1 tablet by mouth daily at 12 noon.            Discharge Care Instructions  (From admission, onward)         Start     Ordered   06/08/19 0000  Discharge wound care:    Comments: Leave Steri strips in place until visit unless >10 days then remove yourself.   06/08/19 1057          Diet: routine diet  Activity: Advance as tolerated. Pelvic rest for 6 weeks.   Outpatient follow up:2 weeks Follow up Appt: Future  Appointments  Date Time Provider Department Center  06/13/2019  2:15 PM Cheral MarkerBooker, [place]ly [place], [place] [facility]  07/10/2019  2:30 PM Cheral MarkerBooker, Kimberly R, CNM [facility]   Follow up Visit: Follow-up Information    [facility] in 2 week(s).   Specialty: Obstetrics and Gynecology Contact information: [address] [place] [phone]             Newborn Data: Live born adult  Birth Weight:   APGAR: ,   Newborn Delivery   Birth date/time:  Delivery type:       Baby Feeding: Bottle and Breast Disposition:NICU   06/08/2019 Cherre RobinsJessica L Ximena Todaro, CNM

## 2019-06-05 NOTE — Progress Notes (Signed)
Patient ID: Michelle Short, female   DOB: 02-04-1991, 28 y.o.   MRN: [id number] Michelle Short is a 28 y.o. G2P0010 at [redacted]w[redacted]d admitted for PROM  Subjective: Patient is still having pain under bilateral ribs during uterine contractions, but otherwise has no complaints.  Objective: BP 125/78   Pulse 80   Temp 98.4 F (36.9 C) (Axillary)   Resp 18   Ht 5\' 4"  (1.626 m)   Wt 71.5 kg   LMP 08/26/2018 (Approximate)   SpO2 98%   BMI 27.07 kg/m  Total I/O In: -  Out: 200 [Urine:200]  FHT:  FHR: 145 bpm, variability: moderate,  accelerations:  Present,  decelerations:  Variable (was having recurrent lates earlier but resolved once pitocin was turned off) UC:   q 5-6 mins MVUs 130-200  SVE:   Dilation: 8 Effacement (%): 90 Station: 0(caput at +1) Exam by:: [name], CNM   Labs: Lab Results  Component Value Date   WBC 7.9 06/03/2019   HGB 11.9 (L) 06/03/2019   HCT 36.9 06/03/2019   MCV 92.5 06/03/2019   PLT 163 06/03/2019    Assessment / Plan: Patient with protracted labor course and unable to maintain adequate contractions due to late decelerations.  Currently has reassuring FHR tracing, but has not had significant cervical change since yesterday morning. Has also developed cervical swelling, fetal heat caput and molding, all concerning for possible cephalopelvic disproportion. Given all of these factors, cesarean delivery was recommended. The risks of cesarean section discussed with the patient included but were not limited to: bleeding which may require transfusion or reoperation; infection which may require antibiotics; injury to bowel, bladder, ureters or other surrounding organs; injury to the fetus; need for additional procedures including hysterectomy in the event of a life-threatening hemorrhage; placental abnormalities wth subsequent pregnancies, incisional problems, thromboembolic phenomenon and other postoperative/anesthesia complications. The patient concurred with the  proposed plan, giving informed written consent for the procedure.  Anesthesia and OR aware. Preoperative prophylactic antibiotics and SCDs ordered on call to the OR.  To OR when ready.   Verita Schneiders, MD, [place] for [employer], [place]

## 2019-06-05 NOTE — Lactation Note (Signed)
This note was copied from a baby's chart. Lactation Consultation Note  Patient Name: Michelle Short [id number] Date: 06/05/2019 Reason for consult: Initial assessment;Primapara;1st time breastfeeding;Term  P1 mother whose infant is now 74 hours old.  Baby was asleep in the bassinet when I arrived.  Mother feels like baby has latched but stated, "I want to breast and bottle feed" upon arrival.  She has already given formula supplementation twice since birth. I provided a supplementation guideline sheet and reviewed it with mother.  She has been given 15 mls of supplementation after both feedings.    Encouraged to feed 8-12 times/24 hours or sooner if baby shows feeding cues.  Reviewed cues.  Colostrum container provided and milk storage times discussed.  Finger feeding demonstrated.  Suggested mother call her RN/LC for latch assistance as needed.  Reminded mother to continue hand expression before/after feeding to help increase milk supply.  Mother is planning on applying for [facility] and I suggested she call as soon as possible to make an appointment.  Mother verbalized understanding.    Mom made aware of O/P services, breastfeeding support groups, community resources, and our phone # for post-discharge questions. Virtual breast feeding support group information given.  Father present and sleeping on couch.   Maternal Data Formula Feeding for Exclusion: Yes Reason for exclusion: Mother's choice to formula and breast feed on admission Has patient been taught Hand Expression?: Yes Does the patient have breastfeeding experience prior to this delivery?: No  Feeding Feeding Type: Breast Fed  LATCH Score Latch: Repeated attempts needed to sustain latch, nipple held in mouth throughout feeding, stimulation needed to elicit sucking reflex.  Audible Swallowing: None  Type of Nipple: Everted at rest and after stimulation  Comfort (Breast/Nipple): Soft / non-tender  Hold (Positioning): Assistance  needed to correctly position infant at breast and maintain latch.  LATCH Score: 6  Interventions Interventions: Hand express;Breast feeding basics reviewed;Skin to skin;Assisted with latch  Lactation Tools Discussed/Used Tools: Pump Breast pump type: Manual WIC Program: (Mother plans to apply)   Consult Status Consult Status: Follow-up Date: 06/06/19 Follow-up type: In-patient    Falana Clagg R Maanav Kassabian 06/05/2019, 1:29 PM

## 2019-06-05 NOTE — Anesthesia Procedure Notes (Signed)
Epidural Patient location during procedure: OB Start time: 06/03/2019 12:48 PM End time: 06/05/2019 1:01 PM  Staffing Anesthesiologist: Barnet Glasgow, MD Performed: anesthesiologist   Preanesthetic Checklist Completed: patient identified, site marked, surgical consent, pre-op evaluation, timeout performed, IV checked, risks and benefits discussed and monitors and equipment checked  Epidural Patient position: sitting Prep: site prepped and draped and DuraPrep Patient monitoring: continuous pulse ox and blood pressure Approach: midline Location: L3-L4 Injection technique: LOR air  Needle:  Needle type: Tuohy  Needle gauge: 17 G Needle length: 9 cm and 9 Needle insertion depth: 5 cm cm Catheter type: closed end flexible Catheter size: 19 Gauge Catheter at skin depth: 10 cm Test dose: negative  Assessment Events: blood not aspirated, injection not painful, no injection resistance, negative IV test and no paresthesia  Additional Notes Patient identified. Risks/Benefits/Options discussed with patient including but not limited to bleeding, infection, nerve damage, paralysis, failed block, incomplete pain control, headache, blood pressure changes, nausea, vomiting, reactions to medication both or allergic, itching and postpartum back pain. Confirmed with bedside nurse the patient's most recent platelet count. Confirmed with patient that they are not currently taking any anticoagulation, have any bleeding history or any family history of bleeding disorders. Patient expressed understanding and wished to proceed. All questions were answered. Sterile technique was used throughout the entire procedure. Please see nursing notes for vital signs. Test dose was given through epidural needle and negative prior to continuing to dose epidural or start infusion. Warning signs of high block given to the patient including shortness of breath, tingling/numbness in hands, complete motor block, or any  concerning symptoms with instructions to call for help. Patient was given instructions on fall risk and not to get out of bed. All questions and concerns addressed with instructions to call with any issues. 1 Attempt (S) . Patient tolerated procedure well.

## 2019-06-05 NOTE — Op Note (Signed)
Michelle Short PROCEDURE DATE: 06/05/2019  PREOPERATIVE DIAGNOSES: Intrauterine pregnancy at [redacted]w[redacted]d weeks gestation; failure to progress: arrest of dilation and non-reassuring fetal status  POSTOPERATIVE DIAGNOSES: The same  PROCEDURE: Primary Low Transverse Cesarean Section  SURGEON:  Dr. Verita Schneiders  ANESTHESIOLOGY TEAM: Anesthesiologist: Barnet Glasgow, MD CRNA: Alvy Bimler, CRNA; Ignacia Bayley, CRNA  INDICATIONS: Michelle Short is a 28 y.o. G2P1011 at [redacted]w[redacted]d here for cesarean section secondary to the indications listed under preoperative diagnoses; please see preoperative note for further details.  The risks of cesarean section were discussed with the patient including but were not limited to: bleeding which may require transfusion or reoperation; infection which may require antibiotics; injury to bowel, bladder, ureters or other surrounding organs; injury to the fetus; need for additional procedures including hysterectomy in the event of a life-threatening hemorrhage; placental abnormalities wth subsequent pregnancies, incisional problems, thromboembolic phenomenon and other postoperative/anesthesia complications.   The patient concurred with the proposed plan, giving informed written consent for the procedure.    FINDINGS:  Viable female infant in cephalic presentation.  Apgars 4 and 7. Unable to get an adequate cord blood sample for gas analysis. Light meconium in amniotic fluid.  Intact placenta, three vessel cord.  Normal uterus, fallopian tubes and ovaries bilaterally.  ANESTHESIA: Epidural  ESTIMATED BLOOD LOSS: 326 ml as per Triton SPECIMENS: Placenta sent to pathology COMPLICATIONS: None immediate  PROCEDURE IN DETAIL:  The patient preoperatively received intravenous antibiotics and had sequential compression devices applied to her lower extremities.  She was then taken to the operating room where the epidural anesthesia was dosed up to surgical level and was found to  be adequate. She was then placed in a dorsal supine position with a leftward tilt, and prepped and draped in a sterile manner.  A foley catheter was placed into her bladder and attached to constant gravity.  After an adequate timeout was performed, a Pfannenstiel skin incision was made with scalpel and carried through to the underlying layer of fascia. The fascia was incised in the midline, and this incision was extended bilaterally using the Mayo scissors.  Kocher clamps were applied to the superior aspect of the fascial incision and the underlying rectus muscles were dissected off bluntly and sharply.  A similar process was carried out on the inferior aspect of the fascial incision. The rectus muscles were separated in the midline and the peritoneum was entered bluntly. The Alexis self-retaining retractor was introduced into the abdominal cavity.  Attention was turned to the lower uterine segment where a low transverse hysterotomy was made with a scalpel and extended bilaterally bluntly.  The infant was successfully delivered, the cord was clamped and cut after one minute, and the infant was handed over to the awaiting neonatology team. Uterine massage was then administered, and the placenta delivered intact with a three-vessel cord. The uterus was then cleared of clots and debris.  The hysterotomy was closed with 0 Vicryl in a running locked fashion, and an imbricating layer was also placed with 0 Vicryl. Figure-of-eight 0 Vicryl serosal stitches were placed to help with hemostasis.  The pelvis was cleared of all clot and debris. Hemostasis was confirmed on all surfaces.  The retractor was removed.  The peritoneum was closed with a 2-0 Vicryl running stitch. The fascia was then closed using 0 Vicryl in a running fashion.  The subcutaneous layer was irrigated, and the skin was closed with a 4-0 Vicryl subcuticular stitch. The patient tolerated the procedure well.  Sponge, instrument and needle counts were correct x  3.  She was taken to the recovery room in stable condition.    Michelle CollinsUGONNA  Michelle Piccini, MD, FACOG Obstetrician & Gynecologist, [facility] for [facility], [facility]

## 2019-06-05 NOTE — Anesthesia Preprocedure Evaluation (Signed)
Anesthesia Evaluation  Patient identified by MRN, date of birth, ID band Patient awake    Reviewed: Allergy & Precautions, NPO status , Patient's Chart, lab work & pertinent test results  Airway Mallampati: II  TM Distance: >3 FB Neck ROM: Full    Dental no notable dental hx. (+) Teeth Intact   Pulmonary neg pulmonary ROS,    Pulmonary exam normal breath sounds clear to auscultation       Cardiovascular Exercise Tolerance: Good negative cardio ROS Normal cardiovascular exam Rhythm:Regular Rate:Normal     Neuro/Psych  Headaches, Seizures -,     GI/Hepatic negative GI ROS, Neg liver ROS,   Endo/Other    Renal/GU      Musculoskeletal   Abdominal   Peds  Hematology   Anesthesia Other Findings   Reproductive/Obstetrics (+) Pregnancy                             Lab Results  Component Value Date   WBC 7.9 06/03/2019   HGB 11.9 (L) 06/03/2019   HCT 36.9 06/03/2019   MCV 92.5 06/03/2019   PLT 163 06/03/2019    Anesthesia Physical Anesthesia Plan  ASA: III and emergent  Anesthesia Plan: Epidural   Post-op Pain Management:    Induction: Intravenous  PONV Risk Score and Plan: 3 and Treatment may vary due to age or medical condition, Ondansetron and Dexamethasone  Airway Management Planned: Natural Airway  Additional Equipment:   Intra-op Plan:   Post-operative Plan:   Informed Consent: I have reviewed the patients History and Physical, chart, labs and discussed the procedure including the risks, benefits and alternatives for the proposed anesthesia with the patient or authorized representative who has indicated his/her understanding and acceptance.     Dental advisory given  Plan Discussed with:   Anesthesia Plan Comments:         Anesthesia Quick Evaluation

## 2019-06-05 NOTE — Transfer of Care (Signed)
Immediate Anesthesia Transfer of Care Note  Patient: Michelle Short  Procedure(s) Performed: CESAREAN SECTION (N/A )  Patient Location: PACU  Anesthesia Type:Epidural  Level of Consciousness: awake  Airway & Oxygen Therapy: Patient Spontanous Breathing  Post-op Assessment: Report given to RN and Post -op Vital signs reviewed and stable  Post vital signs: stable  Last Vitals:  Vitals Value Taken Time  BP 94/83 06/05/19 0248  Temp    Pulse 81 06/05/19 0252  Resp 12 06/05/19 0252  SpO2 100 % 06/05/19 0252  Vitals shown include unvalidated device data.  Last Pain:  Vitals:   06/04/19 2305  TempSrc:   PainSc: 5       Patients Stated Pain Goal: 9 (19/50/93 2671)  Complications: No apparent anesthesia complications

## 2019-06-06 ENCOUNTER — Encounter (HOSPITAL_COMMUNITY): Payer: Self-pay | Admitting: Obstetrics & Gynecology

## 2019-06-06 LAB — CBC
HCT: 26.5 % — ABNORMAL LOW (ref 36.0–46.0)
Hemoglobin: 8.4 g/dL — ABNORMAL LOW (ref 12.0–15.0)
MCH: 29.4 pg (ref 26.0–34.0)
MCHC: 31.7 g/dL (ref 30.0–36.0)
MCV: 92.7 fL (ref 80.0–100.0)
Platelets: 172 10*3/uL (ref 150–400)
RBC: 2.86 MIL/uL — ABNORMAL LOW (ref 3.87–5.11)
RDW: 13.7 % (ref 11.5–15.5)
WBC: 11.5 10*3/uL — ABNORMAL HIGH (ref 4.0–10.5)
nRBC: 0 % (ref 0.0–0.2)

## 2019-06-06 LAB — CREATININE, SERUM
Creatinine, Ser: 1.01 mg/dL — ABNORMAL HIGH (ref 0.44–1.00)
GFR calc Af Amer: >60 mL/min (ref >60)
GFR calc non Af Amer: >60 mL/min (ref >60)

## 2019-06-06 NOTE — Progress Notes (Signed)
Subjective: Postpartum Day 1: Cesarean Delivery Patient reports incisional pain and tolerating PO.    Objective: Vital signs in last 24 hours: Temp:  [98.2 F (36.8 C)-98.9 F (37.2 C)] 98.2 F (36.8 C) (07/16 0630) Pulse Rate:  [86-89] 89 (07/16 0630) Resp:  [16-18] 16 (07/16 0630) BP: (107-126)/(56-72) 110/62 (07/16 0630) SpO2:  [96 %-98 %] 98 % (07/16 0630)  Physical Exam:  General: alert, cooperative and no distress Lochia: appropriate Uterine Fundus: firm Incision: no significant drainage DVT Evaluation: No evidence of DVT seen on physical exam.  Recent Labs    06/06/19 0700  HGB 8.4*  HCT 26.5*    Assessment/Plan: Status post Cesarean section. Doing well postoperatively.  Continue current care.  Hansel Feinstein 06/06/2019, 9:03 AM

## 2019-06-07 NOTE — Clinical Social Work Maternal (Signed)
CLINICAL SOCIAL WORK MATERNAL/CHILD NOTE  Patient Details  Name: Michelle Short MRN: [id number] Date of Birth: 07/31/1991  Date:  Apr 17, 2019  Clinical Social Worker Initiating Note:  Laurey Arrow Date/Time: Initiated:  06/06/19/1400     Child's Name:  Michelle Short   Biological Parents:  Mother, Father(FOB is Amyr Sluder (04/02/1994))   Need for Interpreter:  None   Reason for Referral:  (MOB reported that she lives iwth her grandmother and sister.)   Address:  [address] [place] [address]    Phone number:  [phone] (home)     Additional phone number:  Household Members/Support Persons (HM/SP):       HM/SP Name Relationship DOB or Age  HM/SP -1        HM/SP -2        HM/SP -3        HM/SP -4        HM/SP -5        HM/SP -6        HM/SP -7        HM/SP -8          Natural Supports (not living in the home):  Immediate Family, Extended Family, Spouse/significant other, Artist Supports: None   Employment: Unemployed   Type of Work:     Education:  Attending college   Homebound arranged:    Museum/gallery curator Resources:  Medicaid   Other Resources:  Physicist, medical (MOB was provided information to apply for [facility])   Cultural/Religious Considerations Which May Impact Care:  None reported  Strengths:  Ability to meet basic needs , Home prepared for child    Psychotropic Medications:         Pediatrician:       Pediatrician List:   [place]      Pediatrician Fax Number:    Risk Factors/Current Problems:      Cognitive State:  Alert , Linear Thinking , Insightful    Mood/Affect:  Comfortable , Interested , Happy , Bright , Relaxed    CSW Assessment: CSW met with MOB in room 342 to complete an assessment for NICU admission, Edinburgh score 14 and hx of depression. When CSW arrived MOB was observing infant in isolette. MOB agave CSW  permission to complete assessment while MOB was at infant's bedside.   CSW asked about MOB's birthing experience and MOB reported that her experience was traumatic which resulted to her having a high Edinburgh Score 14.  MOB stated, "I was so scared that my baby was not going to survive and to have him to come."  CSW validated and normalized MOB's thoughts and feelings.CSW discussed common emotions often experienced related to a NICU admission as well as during the first couple weeks of the postpartum period.  CSW provided education regarding the baby blues period vs. perinatal mood disorders, discussed treatment and gave resources for mental health follow up if concerns arise.  CSW recommends self-evaluation during the postpartum time period using the New Mom Checklist from Postpartum Progress and encouraged MOB to contact a medical professional if symptoms are noted at any time.  MOB reported currently having a therapist to address symtoms of anxiety (MOB was unable to recall therapist name or agency name). CSW assessed for safety and MOB denied SI, HI, and DV. MOB reports having a good support team  that consists of MOB's and FOB's family.   CSW informed MOB about the NICU, what to expect and resources/supports available while infant is admitted to the NICU. MOB reported no transportation barriers for visitation of infant. MOB denied any questions/concerns. CSW provided MOB with a butterfly from [employer].   CSW provided review of Sudden Infant Death Syndrome (SIDS) precautions.    CSW encouraged MOB to contact CSW if any needs/concerns arise     CSW Plan/Description:  Psychosocial Support and Ongoing Assessment of Needs, Sudden Infant Death Syndrome (SIDS) Education, Perinatal Mood and Anxiety Disorder (PMADs) Education, Other Patient/Family Education, Other Information/Referral to [employer], MSW, [employer] Clinical Social Work [phone]   Dimple Nanas, LCSW 06/07/2019, 9:27 AM

## 2019-06-07 NOTE — Progress Notes (Signed)
Post-Op Day 2, PLTCS for arrest of dilation/NRFHT  Subjective: No complaints, up ad lib, voiding and tolerating PO, passing flatus,small lochia,.plans to breastfeed, Depo-Provera  Baby in NICU for feeding issues  Objective: Blood pressure 124/90, pulse 86, temperature 97.8 F (36.6 C), temperature source Oral, resp. rate 15, height 5\' 4"  (1.626 m), weight 71.5 kg, last menstrual period 08/26/2018, SpO2 100 %, unknown if currently breastfeeding.  Physical Exam:  General: alert, cooperative and no distress Lochia:normal flow Chest: CTAB Heart: RRR no m/r/g Abdomen: +BS, soft, nontender, dsg c/d/intact, no erythema Uterine Fundus: firm DVT Evaluation: No evidence of DVT seen on physical exam. Extremities: trace edema  Recent Labs    06/06/19 0700  HGB 8.4*  HCT 26.5*    Assessment/Plan: Plan for discharge tomorrow   LOS: 4 days   Christin Fudge 06/07/2019, 7:36 AM

## 2019-06-07 NOTE — Anesthesia Postprocedure Evaluation (Signed)
Anesthesia Post Note  Patient: Michelle Short  Procedure(s) Performed: AN AD [place]     Patient location during evaluation: Mother Baby Anesthesia Type: Epidural Level of consciousness: oriented and awake and alert Pain management: pain level controlled Vital Signs Assessment: post-procedure vital signs reviewed and stable Respiratory status: spontaneous breathing and respiratory function stable Cardiovascular status: blood pressure returned to baseline and stable Postop Assessment: no headache, no backache, no apparent nausea or vomiting and able to ambulate Anesthetic complications: no    Last Vitals:  Vitals:   06/06/19 2257 06/07/19 0537  BP: 119/76 124/90  Pulse: 85 86  Resp: 16 15  Temp: 36.9 C 36.6 C  SpO2: 100% 100%    Last Pain:  Vitals:   06/07/19 1228  TempSrc:   PainSc: 4                  Barnet [place]

## 2019-06-07 NOTE — Anesthesia Preprocedure Evaluation (Signed)
Anesthesia Evaluation  Patient identified by MRN, date of birth, ID band Patient awake    Reviewed: Allergy & Precautions, NPO status , Patient's Chart, lab work & pertinent test results  Airway Mallampati: II  TM Distance: >3 FB Neck ROM: Full    Dental no notable dental hx.    Pulmonary    Pulmonary exam normal breath sounds clear to auscultation       Cardiovascular Exercise Tolerance: Good Normal cardiovascular exam Rhythm:Regular Rate:Normal     Neuro/Psych Seizures -,     GI/Hepatic   Endo/Other    Renal/GU      Musculoskeletal   Abdominal   Peds  Hematology   Anesthesia Other Findings   Reproductive/Obstetrics (+) Pregnancy                             Anesthesia Physical Anesthesia Plan  ASA:   Anesthesia Plan: Epidural   Post-op Pain Management:    Induction:   PONV Risk Score and Plan:   Airway Management Planned:   Additional Equipment:   Intra-op Plan:   Post-operative Plan:   Informed Consent: I have reviewed the patients History and Physical, chart, labs and discussed the procedure including the risks, benefits and alternatives for the proposed anesthesia with the patient or authorized representative who has indicated his/her understanding and acceptance.       Plan Discussed with:   Anesthesia Plan Comments:         Anesthesia Quick Evaluation

## 2019-06-07 NOTE — Lactation Note (Signed)
This note was copied from a baby's chart. Lactation Consultation Note  Patient Name: Michelle Short [id number] Date: 06/07/2019 Reason for consult: Initial assessment;Term;Primapara;1st time breastfeeding;NICU baby;Mother's request  Visited with mom of a 25 hours old NICU female, her RN called for assistance. Baby is feeding supplemented through an IV, gavage, bottles and mom is also taking him to the breast; he's getting both, breast and Similac 20 calorie formula. LC came for the 8 pm feeding, RN finishing up with baby's heel stick and diaper change.   Prior latching baby to the breast practiced with mom the hand expression technique, noticed that she's getting full, her right breast is getting hard and her left one (where baby just fed) is also full but a little "softer" than the right one. Still able to hand express though, she's not engorged, reviewed engorgement prevention and treatment and treatment/prevention of sore nipples. LC showed mom how to do breast massage prior pumping in order to alleviate those hard knots.  LC also noted that DEBP set up in the NICU had the junctures loose and adjusted them, let mom know that she'll feel a difference in the suction level the next time she pumps. LC took baby STS to mother's left breast and he was able to latch, but started loosing the depth and required some repositioning. A few audible swallows noted upon breast compressions, baby fed for 10 minutes, LC had to breast the latch because it became shallow. Reviewed pumping schedule and advised mom to do more breast massage prior pumping.  Feeding plan:  1. Encouraged mom to feed baby STS whenever she's in the NICU; if baby is showing cues 2. She work on lots of breast massage, finger tapping and breast knitting prior pumping 3. She'll continue pumping every 3 hours, and start applying ice-packs if the pump is not completely emptying the breast  Mom reported all questions and concerns were answered,  she's aware of LC OP services and will call PRN.  Maternal Data    Feeding Feeding Type: Breast Fed  LATCH Score Latch: Repeated attempts needed to sustain latch, nipple held in mouth throughout feeding, stimulation needed to elicit sucking reflex.  Audible Swallowing: A few with stimulation  Type of Nipple: Everted at rest and after stimulation  Comfort (Breast/Nipple): Soft / non-tender  Hold (Positioning): Assistance needed to correctly position infant at breast and maintain latch.  LATCH Score: 7  Interventions Interventions: Breast feeding basics reviewed;Assisted with latch;Skin to skin;Breast massage;Hand express;Breast compression;Adjust position;Support pillows;DEBP  Lactation Tools Discussed/Used Tools: Pump Breast pump type: Double-Electric Breast Pump   Consult Status Consult Status: PRN Date: 06/08/19 Follow-up type: In-patient    [place] 06/07/2019, 8:34 PM

## 2019-06-08 MED ORDER — ACETAMINOPHEN 500 MG PO TABS: 1000.0000 mg | ORAL_TABLET | Freq: Four times a day (QID) | ORAL | 1 refills | Status: DC | PRN

## 2019-06-08 MED ORDER — TRAMADOL HCL 50 MG PO TABS: 50.0000 mg | ORAL_TABLET | Freq: Four times a day (QID) | ORAL | 0 refills | Status: DC | PRN

## 2019-06-08 NOTE — Discharge Instructions (Signed)

## 2019-06-08 NOTE — Lactation Note (Signed)
This note was copied from a baby's chart. Lactation Consultation Note  Patient Name: Michelle Short [id number] Date: 06/08/2019 Reason for consult: Follow-up assessment;1st time breastfeeding;Primapara;NICU baby;Term;Other (Comment)(milk is in bilaterally - boarderline engorged - ice provided)  Baby is 35 hours old  [place] visited mom in her room 416 and she mentioned her breast were hard.  LC offered to assess and mom receptive.  LC noted boarder line engorgement and the [place] provided ice packs.  LC instructed mom to ice for 15 -20 mins and then to pump for the 15 -20 mins.  Sore nipples and engorgement prevention and tx reviewed, supply and demand,  And the importance of being consistent around the clock to remove the milk and soften the both breast  8-12 times a day. LC recommended taking the DEBP kit to NICU and leaving it there so when she is visiting  She can pump after breastfeeding. Per mom plans to buy a DEBP Medela today. Storage of breast milk for NICU and  When the baby goes home.  Mom receptive to teaching and asked appropriate breast feeding questions.  Mom aware of the BG resources after D/c.    Maternal Data Has patient been taught Hand Expression?: Yes(LC reviewed and mom able to return demo)  Feeding Feeding Type: Formula Nipple Type: Nfant Slow Flow (purple)  LATCH Score                   Interventions Interventions: Breast feeding basics reviewed;DEBP  Lactation Tools Discussed/Used Tools: Pump Breast pump type: Double-Electric Breast Pump Pump Review: Milk Storage   Consult Status Consult Status: PRN Follow-up type: Other (comment)(baby in NICU)    [place] 06/08/2019, 9:25 AM

## 2019-06-13 ENCOUNTER — Encounter: Payer: Self-pay | Admitting: Women's Health

## 2019-06-13 ENCOUNTER — Ambulatory Visit (INDEPENDENT_AMBULATORY_CARE_PROVIDER_SITE_OTHER): Payer: Medicaid Other | Admitting: Women's Health

## 2019-06-13 ENCOUNTER — Other Ambulatory Visit: Payer: Self-pay

## 2019-06-13 VITALS — BP 123/72 | HR 78 | Ht 63.0 in | Wt 138.4 lb

## 2019-06-13 DIAGNOSIS — R600 Localized edema: Secondary | ICD-10-CM

## 2019-06-13 DIAGNOSIS — Z4889 Encounter for other specified surgical aftercare: Secondary | ICD-10-CM

## 2019-06-13 MED ORDER — HYDROCHLOROTHIAZIDE 12.5 MG PO TABS: 12.5000 mg | ORAL_TABLET | Freq: Every day | ORAL | 0 refills | Status: DC

## 2019-06-13 NOTE — Progress Notes (Signed)
   GYN VISIT Patient name: Michelle Short MRN [id number]  Date of birth: 09/10/1991 Chief Complaint:   Post-op Follow-up (c-section/ check incision)  History of Present Illness:   Michelle Short is a 28 y.o. G1P1011 African American female 8d s/p PLTCS being seen today for incision check. Breastfeeding- good milk supply. Denies dep/anx. Feet/legs swollen. Lochia normal.   Patient's last menstrual period was 08/26/2018 (approximate). The current method of family planning is abstinence.  Review of Systems:   Pertinent items are noted in HPI Denies fever/chills, dizziness, headaches, visual disturbances, fatigue, shortness of breath, chest pain, abdominal pain, vomiting, abnormal vaginal discharge/itching/odor/irritation, problems with periods, bowel movements, urination, or intercourse unless otherwise stated above.  Pertinent History Reviewed:  Reviewed past medical,surgical, social, obstetrical and family history.  Reviewed problem list, medications and allergies. Physical Assessment:   Vitals:   06/13/19 1419  BP: 123/72  Pulse: 78  Weight: 138 lb 6.4 oz (62.8 kg)  Height: 5\' 3"  (1.6 m)  Body mass index is 24.52 kg/m.       Physical Examination:   General appearance: alert, well appearing, and in no distress  Mental status: alert, oriented to person, place, and time  Skin: warm & dry   Cardiovascular: normal heart rate noted  Respiratory: normal respiratory effort, no distress  Abdomen: soft, non-tender, c/s incision healing well, small ~0.5cm open area mid incision on bottom  Pelvic: examination not indicated  Extremities: 1+ edema  No results found for this or any previous visit (from the past 24 hour(s)).  Assessment & Plan:  1) 8d s/p PLTCS> incision healing well, small open area midline bottom, continue to keep clean/dry, let us know if not continuing to heal  2) Bilateral LLE> rx HCTZ 12.5mg  daily x 7d  Meds:  Meds ordered this encounter  Medications  .  hydrochlorothiazide (HYDRODIURIL) 12.5 MG tablet    Sig: Take 1 tablet (12.5 mg total) by mouth daily.    Dispense:  7 tablet    Refill:  0    Order Specific Question:   Supervising Provider    Answer:   Elonda Husky, LUTHER H [[id number]]    No orders of the defined types were placed in this encounter.   Return for As scheduled 8/19 for , postpartum visit.  [place], [facility] 06/13/2019 3:46 PM

## 2019-06-30 ENCOUNTER — Encounter (HOSPITAL_COMMUNITY): Payer: Self-pay

## 2019-06-30 ENCOUNTER — Ambulatory Visit (HOSPITAL_COMMUNITY)
Admission: EM | Admit: 2019-06-30 | Discharge: 2019-06-30 | Disposition: A | Discharge status: Home / Self Care | Payer: Medicaid Other | Attending: Family Medicine | Admitting: Family Medicine

## 2019-06-30 ENCOUNTER — Other Ambulatory Visit: Payer: Self-pay

## 2019-06-30 DIAGNOSIS — R569 Unspecified convulsions: Secondary | ICD-10-CM | POA: Diagnosis not present

## 2019-06-30 DIAGNOSIS — R42 Dizziness and giddiness: Secondary | ICD-10-CM | POA: Diagnosis not present

## 2019-06-30 DIAGNOSIS — Z886 Allergy status to analgesic agent status: Secondary | ICD-10-CM | POA: Insufficient documentation

## 2019-06-30 DIAGNOSIS — Z79899 Other long term (current) drug therapy: Secondary | ICD-10-CM | POA: Insufficient documentation

## 2019-06-30 DIAGNOSIS — M542 Cervicalgia: Secondary | ICD-10-CM | POA: Diagnosis not present

## 2019-06-30 DIAGNOSIS — M79606 Pain in leg, unspecified: Secondary | ICD-10-CM | POA: Insufficient documentation

## 2019-06-30 DIAGNOSIS — Z20828 Contact with and (suspected) exposure to other viral communicable diseases: Secondary | ICD-10-CM | POA: Insufficient documentation

## 2019-06-30 DIAGNOSIS — R51 Headache: Secondary | ICD-10-CM | POA: Insufficient documentation

## 2019-06-30 DIAGNOSIS — Z800 Family history of malignant neoplasm of digestive organs: Secondary | ICD-10-CM | POA: Diagnosis not present

## 2019-06-30 DIAGNOSIS — Z833 Family history of diabetes mellitus: Secondary | ICD-10-CM | POA: Diagnosis not present

## 2019-06-30 DIAGNOSIS — R519 Headache, unspecified: Secondary | ICD-10-CM

## 2019-06-30 NOTE — Discharge Instructions (Addendum)
Continue with regular tylenol. Go home, rest in quiet dark room. Limit screen time.  Drink plenty of water to ensure adequate hydration.  Heat application to neck and stretching.  Will notify of any positive findings from covid testing.  Isolate until results are back.  You may monitor your results on your MyChart online as well.   If develop recurrent fever, increased shortness of breath  or chest pain  please return or go to the ER.

## 2019-06-30 NOTE — ED Provider Notes (Signed)
[facility]    CSN: [id number] Arrival date & time: 06/30/19  1420      History   Chief Complaint No chief complaint on file.   HPI Michelle Short is a 28 y.o. female.   Michelle SheldonImani B Rossi presents with complaints of headache and neck ache which started today. States early this morning she first developed leg pain and then felt dizzy when she stood up, pain when she took a deep breath. No longer with any of these symptoms, they were brief and fleeting. Now with headache and some neck pain. Worse if she moves her head. No vision changes. No eye pain. No cough, no congsetion, no chest pain  Or shortness of breath . States she had a temp of 101 this morning at home. Did take tylenol around 7811 which helped. c-section 7/13. Has been at home since, went to the hospital once with her son only. No other known ill contacts. States that her family members have had headaches also. No vomiting. No nausea. History  Of migraines, seizures.     ROS per HPI, negative if not otherwise mentioned.      Past Medical History:  Diagnosis Date  . Common migraine with intractable migraine 09/19/2018  . Headache   . Marijuana abuse   . Seizures (HCC) 12/2017    Patient Active Problem List   Diagnosis Date Noted  . S/P cesarean section for FTP 06/03/2019  . Supervision of normal pregnancy 10/31/2018  . Common migraine with intractable migraine 09/19/2018  . Cervical cancer screening 04/17/2018  . Seizure (HCC) 01/11/2018  . Marijuana abuse     Past Surgical History:  Procedure Laterality Date  . CESAREAN SECTION N/A 06/05/2019   Procedure: CESAREAN SECTION;  Surgeon: Tereso NewcomerAnyanwu, Ugonna A, MD;  Location: [facility];  Service: Obstetrics;  Laterality: N/A;    OB History    Gravida  2   Para  1   Term  1   Preterm      AB  1   Living  1     SAB  1   TAB      Ectopic      Multiple  0   Live Births  1            Home Medications    Prior to Admission  medications   Medication Sig Start Date End Date Taking? Authorizing Provider  acetaminophen (TYLENOL) 500 MG tablet Take 2 tablets (1,000 mg total) by mouth every 6 (six) hours as needed for moderate pain. 06/08/19   Gerrit HeckEmly, Jessica, CNM  hydrochlorothiazide (HYDRODIURIL) 12.5 MG tablet Take 1 tablet (12.5 mg total) by mouth daily. 06/13/19   Cheral MarkerBooker, Kimberly R, CNM  levETIRAcetam (KEPPRA) 500 MG tablet Take 250 mg by mouth 2 (two) times daily.     [provider]  Prenatal Vit-Fe Fumarate-FA (MULTIVITAMIN-PRENATAL) 27-0.8 MG TABS tablet Take 1 tablet by mouth daily at 12 noon.    [provider]  traMADol (ULTRAM) 50 MG tablet Take 1 tablet (50 mg total) by mouth every 6 (six) hours as needed (mild pain). 06/08/19   Gerrit HeckEmly, Jessica, CNM    Family History Family History  Problem Relation Age of Onset  . Hyperlipidemia Mother   . Diabetes Paternal Grandfather   . Cancer Maternal Grandmother        colon cancer  . Heart attack Maternal Grandfather     Social History Social History   Tobacco Use  . Smoking status:  Never Smoker  . Smokeless tobacco: Never Used  Substance Use Topics  . Alcohol use: No  . Drug use: Not Currently    Types: Marijuana     Allergies   Ibuprofen   Review of Systems Review of Systems   Physical Exam Triage Vital Signs ED Triage Vitals  Enc Vitals Group     BP 06/30/19 1433 106/69     Pulse Rate 06/30/19 1433 100     Resp 06/30/19 1433 16     Temp 06/30/19 1433 98.3 F (36.8 C)     Temp Source 06/30/19 1433 Oral     SpO2 06/30/19 1433 99 %     Weight 06/30/19 1432 120 lb (54.4 kg)     Height --      Head Circumference --      Peak Flow --      Pain Score 06/30/19 1432 4     Pain Loc --      Pain Edu? --      Excl. in [place]? --    No data found.  Updated Vital Signs BP 106/69 (BP Location: Right Arm)   Pulse 100   Temp 98.3 F (36.8 C) (Oral)   Resp 16   Wt 120 lb (54.4 kg)   LMP 08/26/2018 (Approximate)   SpO2 99%    BMI 21.26 kg/m    Physical Exam Constitutional:      General: She is not in acute distress.    Appearance: She is well-developed.  HENT:     Head: Normocephalic and atraumatic.     Mouth/Throat:     Mouth: Mucous membranes are moist.  Neck:     Musculoskeletal: Normal range of motion. No neck rigidity.     Meningeal: Brudzinski's sign absent.     Comments: Mild bilateral neck musculature with tenderness on palpation and pain with rotation; no spinous process tenderness; negative meningeal signs  Cardiovascular:     Rate and Rhythm: Normal rate.  Pulmonary:     Effort: Pulmonary effort is normal.  Musculoskeletal: Normal range of motion.  Lymphadenopathy:     Cervical: No cervical adenopathy.  Skin:    General: Skin is warm and dry.  Neurological:     General: No focal deficit present.     Mental Status: She is alert and oriented to person, place, and time. Mental status is at baseline.     Cranial Nerves: No cranial nerve deficit.     Sensory: No sensory deficit.     Motor: No weakness.      UC Treatments / Results  Labs (all labs ordered are listed, but only abnormal results are displayed) Labs Reviewed  NOVEL CORONAVIRUS, NAA (HOSPITAL ORDER, SEND-OUT TO REF LAB)    EKG   Radiology No results found.  Procedures Procedures (including critical care time)  Medications Ordered in UC Medications - No data to display  Initial Impression / Assessment and Plan / UC Course  I have reviewed the triage vital signs and the nursing notes.  Pertinent labs & imaging results that were available during my care of the patient were reviewed by me and considered in my medical decision making (see chart for details).     Afebrile here. No red flag findings here in clinic. She is breastfeeding, and restricted with nsaid use as well, overall limiting pain management options. Patient is non toxic, agreeable to supportive cares for headache. covid testing collected as well.  Return precautions provided. Patient verbalized understanding and agreeable  to plan.   Final Clinical Impressions(s) / UC Diagnoses   Final diagnoses:  Acute nonintractable headache, unspecified headache type  Neck pain     Discharge Instructions     Continue with regular tylenol. Go home, rest in quiet dark room. Limit screen time.  Drink plenty of water to ensure adequate hydration.  Heat application to neck and stretching.  Will notify of any positive findings from covid testing.  Isolate until results are back.  You may monitor your results on your MyChart online as well.   If develop recurrent fever, increased shortness of breath  or chest pain  please return or go to the ER.     ED Prescriptions    None     Controlled Substance Prescriptions [place] Controlled Substance Registry consulted? Not Applicable   Georgetta HaberBurky, Deontrey Massi B, NP 06/30/19 1536

## 2019-06-30 NOTE — ED Triage Notes (Signed)
Pt states she has neck pain and a headache. This started today.

## 2019-07-01 LAB — NOVEL CORONAVIRUS, NAA (HOSP ORDER, SEND-OUT TO REF LAB; TAT 18-24 HRS): SARS-CoV-2, NAA: NOT DETECTED

## 2019-07-02 ENCOUNTER — Encounter (HOSPITAL_COMMUNITY): Payer: Self-pay

## 2019-07-10 ENCOUNTER — Encounter: Payer: Self-pay | Admitting: Women's Health

## 2019-07-10 ENCOUNTER — Ambulatory Visit (INDEPENDENT_AMBULATORY_CARE_PROVIDER_SITE_OTHER): Payer: Medicaid Other | Admitting: Women's Health

## 2019-07-10 ENCOUNTER — Other Ambulatory Visit: Payer: Self-pay

## 2019-07-10 DIAGNOSIS — Z3202 Encounter for pregnancy test, result negative: Secondary | ICD-10-CM

## 2019-07-10 DIAGNOSIS — Z30011 Encounter for initial prescription of contraceptive pills: Secondary | ICD-10-CM

## 2019-07-10 LAB — POCT URINE PREGNANCY: Preg Test, Ur: NEGATIVE

## 2019-07-10 MED ORDER — NORETHINDRONE 0.35 MG PO TABS: 1.0000 | ORAL_TABLET | Freq: Every day | ORAL | 11 refills | Status: DC

## 2019-07-10 NOTE — Addendum Note (Signed)
Addended by: Linton Rump on: 07/10/2019 04:47 PM   Modules accepted: Orders

## 2019-07-10 NOTE — Patient Instructions (Signed)
Norethindrone tablets (contraception) What is this medicine? NORETHINDRONE (nor eth IN drone) is an oral contraceptive. The product contains a female hormone known as a progestin. It is used to prevent pregnancy. This medicine may be used for other purposes; ask your health care provider or pharmacist if you have questions. COMMON BRAND NAME(S): Camila, Deblitane 28-Day, Errin, Heather, Jencycla, Jolivette, Lyza, Nor-QD, Nora-BE, Norlyroc, Ortho Micronor, Sharobel 28-Day What should I tell my health care provider before I take this medicine? They need to know if you have any of these conditions:  blood vessel disease or blood clots  breast, cervical, or vaginal cancer  diabetes  heart disease  kidney disease  liver disease  mental depression  migraine  seizures  stroke  vaginal bleeding  an unusual or allergic reaction to norethindrone, other medicines, foods, dyes, or preservatives  pregnant or trying to get pregnant  breast-feeding How should I use this medicine? Take this medicine by mouth with a glass of water. You may take it with or without food. Follow the directions on the prescription label. Take this medicine at the same time each day and in the order directed on the package. Do not take your medicine more often than directed. Contact your pediatrician regarding the use of this medicine in children. Special care may be needed. This medicine has been used in female children who have started having menstrual periods. A patient package insert for the product will be given with each prescription and refill. Read this sheet carefully each time. The sheet may change frequently. Overdosage: If you think you have taken too much of this medicine contact a poison control center or emergency room at once. NOTE: This medicine is only for you. Do not share this medicine with others. What if I miss a dose? Try not to miss a dose. Every time you miss a dose or take a dose late  your chance of pregnancy increases. When 1 pill is missed (even if only 3 hours late), take the missed pill as soon as possible and continue taking a pill each day at the regular time (use a back up method of birth control for the next 48 hours). If more than 1 dose is missed, use an additional birth control method for the rest of your pill pack until menses occurs. Contact your health care professional if more than 1 dose has been missed. What may interact with this medicine? Do not take this medicine with any of the following medications:  amprenavir or fosamprenavir  bosentan This medicine may also interact with the following medications:  antibiotics or medicines for infections, especially rifampin, rifabutin, rifapentine, and griseofulvin, and possibly penicillins or tetracyclines  aprepitant  barbiturate medicines, such as phenobarbital  carbamazepine  felbamate  modafinil  oxcarbazepine  phenytoin  ritonavir or other medicines for HIV infection or AIDS  St. John's wort  topiramate This list may not describe all possible interactions. Give your health care provider a list of all the medicines, herbs, non-prescription drugs, or dietary supplements you use. Also tell them if you smoke, drink alcohol, or use illegal drugs. Some items may interact with your medicine. What should I watch for while using this medicine? Visit your doctor or health care professional for regular checks on your progress. You will need a regular breast and pelvic exam and Pap smear while on this medicine. Use an additional method of birth control during the first cycle that you take these tablets. If you have any reason to think you   are pregnant, stop taking this medicine right away and contact your doctor or health care professional. If you are taking this medicine for hormone related problems, it may take several cycles of use to see improvement in your condition. This medicine does not protect you  against HIV infection (AIDS) or any other sexually transmitted diseases. What side effects may I notice from receiving this medicine? Side effects that you should report to your doctor or health care professional as soon as possible:  breast tenderness or discharge  pain in the abdomen, chest, groin or leg  severe headache  skin rash, itching, or hives  sudden shortness of breath  unusually weak or tired  vision or speech problems  yellowing of skin or eyes Side effects that usually do not require medical attention (report to your doctor or health care professional if they continue or are bothersome):  changes in sexual desire  change in menstrual flow  facial hair growth  fluid retention and swelling  headache  irritability  nausea  weight gain or loss This list may not describe all possible side effects. Call your doctor for medical advice about side effects. You may report side effects to FDA at 1-800-FDA-1088. Where should I keep my medicine? Keep out of the reach of children. Store at room temperature between 15 and 30 degrees C (59 and 86 degrees F). Throw away any unused medicine after the expiration date. NOTE: This sheet is a summary. It may not cover all possible information. If you have questions about this medicine, talk to your doctor, pharmacist, or health care provider.  2020 Elsevier/Gold Standard (2012-07-27 16:41:35)  

## 2019-07-10 NOTE — Progress Notes (Signed)
POSTPARTUM VISIT Patient name: Michelle Short MRN [id number]  Date of birth: 1991/08/02 Chief Complaint:   postpartum visit (discuss birth control options)  History of Present Illness:   Michelle Short is a 28 y.o. G4P1011 African American female being seen today for a postpartum visit. She is 5 weeks postpartum following a primary cesarean section, low transverse incision at 40.3 gestational weeks d/t FTP @ 8cm. Anesthesia: epidural. Laceration: n/a. I have fully reviewed the prenatal and intrapartum course. Pregnancy uncomplicated. Postpartum course has been uncomplicated. Bleeding thin lochia. Bowel function is normal. Bladder function is normal.  Patient is not sexually active. Last sexual activity: prior to birth of baby.  Contraception method is oral progesterone-only contraceptive.    Last pap 08/25/16.  Results were normal .  Patient's last menstrual period was 08/26/2018 (approximate).  Baby's course has been uncomplicated. Baby is feeding by breast    Edinburgh Postpartum Depression Screening: negative Edinburgh Postnatal Depression Scale - 07/10/19 1443      Edinburgh Postnatal Depression Scale:  In the Past 7 Days   I have been able to laugh and see the funny side of things.  0    I have looked forward with enjoyment to things.  0    I have blamed myself unnecessarily when things went wrong.  0    I have been anxious or worried for no good reason.  0    I have felt scared or panicky for no good reason.  0    Things have been getting on top of me.  0    I have been so unhappy that I have had difficulty sleeping.  0    I have felt sad or miserable.  0    I have been so unhappy that I have been crying.  0    The thought of harming myself has occurred to me.  0    Edinburgh Postnatal Depression Scale Total  0      Review of Systems:   Pertinent items are noted in HPI Denies Abnormal vaginal discharge w/ itching/odor/irritation, headaches, visual changes, shortness of  breath, chest pain, abdominal pain, severe nausea/vomiting, or problems with urination or bowel movements. Pertinent History Reviewed:  Reviewed past medical,surgical, obstetrical and family history.  Reviewed problem list, medications and allergies. OB History  Gravida Para Term Preterm AB Living  2 1 1   1 1   SAB TAB Ectopic Multiple Live Births  1     0 1    # Outcome Date GA Lbr Len/2nd Weight Sex Delivery Anes PTL Lv  2 Term 06/05/19 [redacted]w[redacted]d  8 lb 6.6 oz (3.815 kg) M CS-Classical EPI  LIV  1 SAB 02/18/18           Physical Assessment:   Vitals:   07/10/19 1440  BP: 118/73  Pulse: 73  Weight: 127 lb 8 oz (57.8 kg)  Height: 5\' 4"  (1.626 m)  Body mass index is 21.89 kg/m.       Physical Examination:   General appearance: alert, well appearing, and in no distress  Mental status: alert, oriented to person, place, and time  Skin: warm & dry   Cardiovascular: normal heart rate noted   Respiratory: normal respiratory effort, no distress   Breasts: deferred, no complaints   Abdomen: soft, non-tender   Pelvic: examination not indicated  Rectal: not examined  Extremities: no edema       No results found for this or any previous visit (from  the past 24 hour(s)).  Assessment & Plan:  1) Postpartum exam 2) 5 wks s/p PLTCS d/t FTP 3) Breastfeeding 4) Depression screening 5) Contraception counseling, pt prefers oral progesterone-only contraceptive. Rx micronor w/ 11RF, understands has to take at exact same time daily to be effective, if late taking use condom as back-up   Meds:  Meds ordered this encounter  Medications  . norethindrone (MICRONOR) 0.35 MG tablet    Sig: Take 1 tablet (0.35 mg total) by mouth daily.    Dispense:  1 Package    Refill:  11    Order Specific Question:   Supervising Provider    Answer:   Lazaro ArmsEURE, LUTHER H [[id number]]    Follow-up: Return for 1st available, Pap & physical.   No orders of the defined types were placed in this encounter.   Cheral MarkerKimberly R  Tyion Boylen CNM, [facility]NP[facility] 07/10/2019 3:29 PM

## 2019-07-30 ENCOUNTER — Encounter: Payer: Self-pay | Admitting: Women's Health

## 2019-07-30 ENCOUNTER — Other Ambulatory Visit (HOSPITAL_COMMUNITY)
Admission: RE | Admit: 2019-07-30 | Discharge: 2019-07-30 | Disposition: A | Discharge status: Home / Self Care | Payer: Medicaid Other | Source: Ambulatory Visit | Attending: Obstetrics & Gynecology | Admitting: Obstetrics & Gynecology

## 2019-07-30 ENCOUNTER — Other Ambulatory Visit: Payer: Self-pay

## 2019-07-30 ENCOUNTER — Ambulatory Visit (INDEPENDENT_AMBULATORY_CARE_PROVIDER_SITE_OTHER): Payer: Medicaid Other | Admitting: Women's Health

## 2019-07-30 VITALS — BP 125/63 | HR 81 | Ht 64.0 in | Wt 124.0 lb

## 2019-07-30 DIAGNOSIS — Z01419 Encounter for gynecological examination (general) (routine) without abnormal findings: Secondary | ICD-10-CM

## 2019-07-30 DIAGNOSIS — Z124 Encounter for screening for malignant neoplasm of cervix: Secondary | ICD-10-CM

## 2019-07-30 NOTE — Progress Notes (Signed)
   WELL-WOMAN EXAMINATION Patient name: Michelle Short MRN [id number]  Date of birth: [birth year] Chief Complaint:   Gynecologic Exam  History of Present Illness:   Michelle Short is a 28 y.o. G60P1011 African American female being seen today for a routine well-woman exam.  Current complaints: none, hasn't started micronor, not sexually active. Still breastfeeding  PCP: none      does not desire labs Patient's last menstrual period was 07/28/2019. The current method of family planning is abstinence.  Last pap 08/25/16. Results were: normal @ [facility] Last mammogram: never. Results were: n/a. Family h/o breast cancer: No Last colonoscopy: never. Results were: n/a. Family h/o colorectal cancer: No Review of Systems:   Pertinent items are noted in HPI Denies any headaches, blurred vision, fatigue, shortness of breath, chest pain, abdominal pain, abnormal vaginal discharge/itching/odor/irritation, problems with periods, bowel movements, urination, or intercourse unless otherwise stated above. Pertinent History Reviewed:  Reviewed past medical,surgical, social and family history.  Reviewed problem list, medications and allergies. Physical Assessment:   Vitals:   07/30/19 1558  BP: 125/63  Pulse: 81  Weight: 124 lb (56.2 kg)  Height: 5\' 4"  (1.626 m)  Body mass index is 21.28 kg/m.        Physical Examination:   General appearance - well appearing, and in no distress  Mental status - alert, oriented to person, place, and time  Psych:  She has a normal mood and affect  Skin - warm and dry, normal color, no suspicious lesions noted  Chest - effort normal, all lung fields clear to auscultation bilaterally  Heart - normal rate and regular rhythm  Neck:  midline trachea, no thyromegaly or nodules  Breasts - breasts appear normal, no suspicious masses, no skin or nipple changes or  axillary nodes  Abdomen - soft, nontender, nondistended, no masses or organomegaly  Pelvic - VULVA: normal  appearing vulva with no masses, tenderness or lesions  VAGINA: normal appearing vagina with normal color and discharge, no lesions, on menses  CERVIX: normal appearing cervix without discharge or lesions, no CMT  Thin prep pap is done w/ HR HPV cotesting  UTERUS: uterus is felt to be normal size, shape, consistency and nontender   ADNEXA: No adnexal masses or tenderness noted.  Extremities:  No swelling or varicosities noted  No results found for this or any previous visit (from the past 24 hour(s)).  Assessment & Plan:  1) Well-Woman Exam  Labs/procedures today: pap  Mammogram @27yo  or sooner if problems Colonoscopy @40 -45yo or sooner if problems  No orders of the defined types were placed in this encounter.   Meds: No orders of the defined types were placed in this encounter.   Follow-up: Return in about 1 year (around 07/29/2020) for Physical.  [place], [place] [facility] 07/30/2019 4:22 PM

## 2019-08-02 LAB — CYTOLOGY - PAP
Chlamydia: NEGATIVE
Diagnosis: NEGATIVE
HPV: NOT DETECTED
Neisseria Gonorrhea: NEGATIVE

## 2019-08-07 ENCOUNTER — Ambulatory Visit (INDEPENDENT_AMBULATORY_CARE_PROVIDER_SITE_OTHER): Payer: Medicaid Other | Admitting: Obstetrics and Gynecology

## 2019-08-07 ENCOUNTER — Encounter: Payer: Self-pay | Admitting: Obstetrics and Gynecology

## 2019-08-07 ENCOUNTER — Other Ambulatory Visit: Payer: Self-pay

## 2019-08-07 VITALS — BP 114/76 | HR 78 | Ht 64.0 in | Wt 124.2 lb

## 2019-08-07 DIAGNOSIS — B009 Herpesviral infection, unspecified: Secondary | ICD-10-CM

## 2019-08-07 DIAGNOSIS — N9089 Other specified noninflammatory disorders of vulva and perineum: Secondary | ICD-10-CM

## 2019-08-07 NOTE — Progress Notes (Signed)
Ms Capobianco presents with a left labia lesion, noted 3-4 days ago. Initially thought to be related to shaving. Concerned about HSV No H/O HSV but boyfriend does have a H/O "cold sores" Breast feeding  PE AF VSS Lungs clear Heart RRR Abd soft + BS GU left labial lesion 1 x 1 cm, several ulcer like lesions noted over the area, crusted over, not painful, HSV culture obtained after unroofing lesions  A/P Left Labial lesion HSV culture obtained Await results and treat as incidcated

## 2019-08-10 LAB — HERPES SIMPLEX VIRUS CULTURE

## 2019-08-10 MED ORDER — VALACYCLOVIR HCL 1 G PO TABS: 1000.0000 mg | ORAL_TABLET | Freq: Two times a day (BID) | ORAL | 3 refills | Status: DC

## 2019-08-10 NOTE — Addendum Note (Signed)
Addended by: Chancy Milroy on: 08/10/2019 01:59 PM   Modules accepted: Orders

## 2019-08-27 NOTE — Progress Notes (Signed)
PATIENT: Michelle SheldonImani B Mckeen DOB: 09-22-1991  REASON FOR VISIT: follow up HISTORY FROM: patient  HISTORY OF PRESENT ILLNESS: Today 08/28/19  Ms. Michelle Short is a 28 year old female with history of migraine headaches and seizures.  Her last seizure occurred in December 2019, and was a nocturnal seizure.  At that time, she was pregnant and suffering from hyperemesis gravidarum and was unable to keep all her medications down.  She has since had her baby, who is 763 months old.  She is currently breast-feeding.  She has not had recurrent seizure.  She does report a few nights ago she felt she was going to have a seizure, felt dizzy, weak, which is often felt before a seizure event.  She has history of nocturnal seizures.  She is not sure at this time if she missed a dose of her medication.  She says she is generally compliant.  She remains on low-dose Keppra 250 mg twice a day.  She does not drive a car.  She does not have a driver's license.  She presents today for follow-up unaccompanied.  HISTORY 10/25/2018 Dr. Anne HahnWillis: Ms. Michelle Short is a 28 year old black female with a history of migraine headaches and seizures.  The patient currently is pregnant, she is [redacted] weeks along and she is having hyperemesis gravidarum.  The patient has had difficulty keeping her medications in, because of this, she had a nocturnal seizure on 21 October 2018.  All of her seizures occur at night while sleeping.  The patient is on low-dose Keppra taking 250 mg twice daily.  The patient has been treating her nausea with Phenergan, she has been able to control the nausea better lately, she is now getting her medications in.  He has stopped driving following the seizure.  She returns to this office for an evaluation.   REVIEW OF SYSTEMS: Out of a complete 14 system review of symptoms, the patient complains only of the following symptoms, and all other reviewed systems are negative.  Seizures  ALLERGIES: Allergies  Allergen Reactions   . Ibuprofen Other (See Comments)    "interferes with my seizure medication"    HOME MEDICATIONS: Outpatient Medications Prior to Visit  Medication Sig Dispense Refill  . levETIRAcetam (KEPPRA) 500 MG tablet Take 250 mg by mouth 2 (two) times daily.     . Prenatal Vit-Fe Fumarate-FA (MULTIVITAMIN-PRENATAL) 27-0.8 MG TABS tablet Take 1 tablet by mouth daily at 12 noon.    . valACYclovir (VALTREX) 1000 MG tablet Take 1 tablet (1,000 mg total) by mouth 2 (two) times daily. Take for ten days. 20 tablet 3   No facility-administered medications prior to visit.     PAST MEDICAL HISTORY: Past Medical History:  Diagnosis Date  . Common migraine with intractable migraine 09/19/2018  . Headache   . Marijuana abuse   . Seizures ([facility]) 12/2017    PAST SURGICAL HISTORY: Past Surgical History:  Procedure Laterality Date  . CESAREAN SECTION N/A 06/05/2019   Procedure: CESAREAN SECTION;  Surgeon: Tereso NewcomerAnyanwu, Ugonna A, MD;  Location: [facility] LD ORS;  Service: Obstetrics;  Laterality: N/A;    FAMILY HISTORY: Family History  Problem Relation Age of Onset  . Hyperlipidemia Mother   . Diabetes Paternal Grandfather   . Cancer Maternal Grandmother        colon cancer  . Heart attack Maternal Grandfather     SOCIAL HISTORY: Social History   Socioeconomic History  . Marital status: Single    Spouse name: Irving Copaslex Brown  . Number  of children: 1  . Years of education: 68  . Highest education level: Some college, no degree  Occupational History  . Not on file  Social Needs  . Financial resource strain: Not hard at all  . Food insecurity    Worry: Never true    Inability: Never true  . Transportation needs    Medical: No    Non-medical: No  Tobacco Use  . Smoking status: Never Smoker  . Smokeless tobacco: Never Used  Substance and Sexual Activity  . Alcohol use: No  . Drug use: Not Currently    Types: Marijuana  . Sexual activity: Not Currently    Birth control/protection: None  Lifestyle  .  Physical activity    Days per week: 3 days    Minutes per session: 40 min  . Stress: Not at all  Relationships  . Social connections    Talks on phone: More than three times a week    Gets together: More than three times a week    Attends religious service: More than 4 times per year    Active member of club or organization: No    Attends meetings of clubs or organizations: Never    Relationship status: Never married  . Intimate partner violence    Fear of current or ex partner: No    Emotionally abused: No    Physically abused: No    Forced sexual activity: No  Other Topics Concern  . Not on file  Social History Narrative  . Not on file    PHYSICAL EXAM  Vitals:   08/28/19 1313  BP: 122/70  Pulse: 74  Temp: 98 F (36.7 C)  TempSrc: Oral  Weight: 122 lb 9.6 oz (55.6 kg)  Height: 5\' 4"  (1.626 m)   Body mass index is 21.04 kg/m.  Generalized: Well developed, in no acute distress   Neurological examination  Mentation: Alert oriented to time, place, history taking. Follows all commands speech and language fluent Cranial nerve II-XII: Pupils were equal round reactive to light. Extraocular movements were full, visual field were full on confrontational test. Facial sensation and strength were normal.  Head turning and shoulder shrug  were normal and symmetric. Motor: The motor testing reveals 5 over 5 strength of all 4 extremities. Good symmetric motor tone is noted throughout.  Sensory: Sensory testing is intact to soft touch on all 4 extremities. No evidence of extinction is noted.  Coordination: Cerebellar testing reveals good finger-nose-finger and heel-to-shin bilaterally.  Gait and station: Gait is normal. Tandem gait is normal. Romberg is negative. No drift is seen.  Reflexes: Deep tendon reflexes are symmetric and normal bilaterally.   DIAGNOSTIC DATA (LABS, IMAGING, TESTING) - I reviewed patient records, labs, notes, testing and imaging myself where available.   Lab Results  Component Value Date   WBC 11.5 (H) 06/06/2019   HGB 8.4 (L) 06/06/2019   HCT 26.5 (L) 06/06/2019   MCV 92.7 06/06/2019   PLT 172 06/06/2019      Component Value Date/Time   NA 133 (L) 10/20/2018 1700   K 3.5 10/20/2018 1700   CL 105 10/20/2018 1700   CO2 21 (L) 10/20/2018 1700   GLUCOSE 88 10/20/2018 1700   BUN 7 10/20/2018 1700   CREATININE 1.01 (H) 06/06/2019 0700   CALCIUM 9.1 10/20/2018 1700   PROT 7.2 10/20/2018 1700   ALBUMIN 4.1 10/20/2018 1700   AST 22 10/20/2018 1700   ALT 13 10/20/2018 1700   ALKPHOS 47 10/20/2018  1700   BILITOT 0.6 10/20/2018 1700   GFRNONAA >60 06/06/2019 0700   GFRAA >60 06/06/2019 0700   No results found for: CHOL, HDL, LDLCALC, LDLDIRECT, TRIG, CHOLHDL No results found for: HGBA1C No results found for: VITAMINB12 No results found for: TSH   ASSESSMENT AND PLAN 28 y.o. year old female  has a past medical history of Common migraine with intractable migraine (09/19/2018), Headache, Marijuana abuse, and Seizures ([place]) (12/2017). here with:  1.  History of seizures, last occurrence December 2019 2.  History of hyperemesis gravidarum, postpartum, breast-feeding  She has history of nocturnal seizures.  She reports a few nights ago she had the feeling that a seizure was coming on, felt weak, dizzy.  She has remained on low-dose Keppra 250 mg twice daily.  I will increase her Keppra to 500 mg twice a day due to report of recent feeling of impending seizure event. She does not drive a car.  She will return in 6 months or sooner if needed.  I did advise her symptoms worsen or she develops any new symptoms she should let us know.   I spent 15 minutes with the patient. 50% of this time was spent discussing her plan of care.  Butler Denmark, AGNP-C, DNP 08/28/2019, 1:22 PM [facility] [address], [place],  [address] [phone]

## 2019-08-28 ENCOUNTER — Other Ambulatory Visit: Payer: Self-pay

## 2019-08-28 ENCOUNTER — Encounter: Payer: Self-pay | Admitting: Neurology

## 2019-08-28 ENCOUNTER — Ambulatory Visit: Payer: Medicaid Other | Admitting: Neurology

## 2019-08-28 VITALS — BP 122/70 | HR 74 | Temp 98.0°F | Ht 64.0 in | Wt 122.6 lb

## 2019-08-28 DIAGNOSIS — R569 Unspecified convulsions: Secondary | ICD-10-CM | POA: Diagnosis not present

## 2019-08-28 MED ORDER — LEVETIRACETAM 500 MG PO TABS: 500.0000 mg | ORAL_TABLET | Freq: Two times a day (BID) | ORAL | 11 refills | Status: DC

## 2019-08-28 NOTE — Patient Instructions (Signed)
1. Increase your Keppra 500 mg twice a day 2. Return in 6 months

## 2019-08-28 NOTE — Progress Notes (Signed)
I have read the note, and I agree with the clinical assessment and plan.  Zuleyka Kloc K Alisabeth Selkirk   

## 2019-08-29 ENCOUNTER — Telehealth: Payer: Self-pay | Admitting: *Deleted

## 2019-08-29 NOTE — Telephone Encounter (Signed)
Pt advised can take OTC Colace. Also can eat 4 prunes or drink prune juice. Pt voiced understanding. [place]

## 2019-08-29 NOTE — Telephone Encounter (Signed)
Patient wants to know if she can take a laxative while breastfeeding.

## 2019-12-05 ENCOUNTER — Other Ambulatory Visit: Payer: Self-pay | Admitting: Advanced Practice Midwife

## 2019-12-05 DIAGNOSIS — M6289 Other specified disorders of muscle: Secondary | ICD-10-CM

## 2019-12-14 ENCOUNTER — Encounter: Payer: Self-pay | Admitting: Advanced Practice Midwife

## 2019-12-16 ENCOUNTER — Other Ambulatory Visit: Payer: Self-pay | Admitting: Advanced Practice Midwife

## 2019-12-16 MED ORDER — METRONIDAZOLE 0.75 % VA GEL: 1.0000 | Freq: Every day | VAGINAL | 0 refills | Status: DC

## 2019-12-26 ENCOUNTER — Other Ambulatory Visit: Payer: Self-pay

## 2019-12-26 ENCOUNTER — Encounter: Payer: Self-pay | Admitting: Physical Therapy

## 2019-12-26 ENCOUNTER — Ambulatory Visit: Payer: Medicaid Other | Attending: Advanced Practice Midwife | Admitting: Physical Therapy

## 2019-12-26 DIAGNOSIS — R252 Cramp and spasm: Secondary | ICD-10-CM | POA: Diagnosis not present

## 2019-12-26 DIAGNOSIS — M6281 Muscle weakness (generalized): Secondary | ICD-10-CM | POA: Diagnosis not present

## 2019-12-27 NOTE — Therapy (Addendum)
[facility] [address] [place], [place], [address] Phone: [phone]   Fax:  [phone]  Physical Therapy Evaluation  Patient Details  Name: Michelle Short MRN: [id number] Date of Birth: 02/16/1991 Referring Provider (PT): Christin Fudge, [place]   Encounter Date: 12/26/2019  PT End of Session - 12/27/19 0830    Visit Number  1    Date for PT Re-Evaluation  02/20/20    Authorization Type  Mcaid    PT Start Time  1202   arrived late   PT Stop Time  1230    PT Time Calculation (min)  28 min    Activity Tolerance  Patient tolerated treatment well    Behavior During Therapy  [facility] for tasks assessed/performed       Past Medical History:  Diagnosis Date  . Common migraine with intractable migraine 09/19/2018  . Headache   . Marijuana abuse   . Seizures ([place]) 12/2017    Past Surgical History:  Procedure Laterality Date  . CESAREAN SECTION N/A 06/05/2019   Procedure: CESAREAN SECTION;  Surgeon: Osborne Oman, MD;  Location: [facility] LD ORS;  Service: Obstetrics;  Laterality: N/A;    There were no vitals filed for this visit.   Subjective Assessment - 12/26/19 1206    Subjective  I have had tailbone pain since about 7 month pregnant.  Pt has 25 mos old boy.    How long can you sit comfortably?  20-30 min    Patient Stated Goals  be able to sit and lie down without pain    Currently in Pain?  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    Referring Provider (PT)  Christin Fudge, CNM    Prior Therapy  No      Precautions   Precautions  None      Restrictions   Weight Bearing Restrictions  No      Balance Screen   Has the patient fallen in the past 6 months  No      [place] residence    Living Arrangements  Parent;Other relatives   grandma and sister and infant son     Prior Function   Level of Independence  Independent    Vocation  Full time employment    Vocation Requirements  stand for 12 hours    Leisure  spend time with dogs      Cognition   Overall Cognitive Status  Within Functional Limits for tasks assessed      Observation/Other Assessments   Observations  rounded back and sacral sitting posture      Posture/Postural Control   Posture/Postural Control  Postural limitations    Postural Limitations  Rounded Shoulders;Anterior  pelvic tilt      AROM   Overall AROM Comments  lumbar flex/ext 25% limited      Flexibility   Soft Tissue Assessment /Muscle Length  yes    Hamstrings  75%      Palpation   Palpation comment  lumbar paraspinals tight      Ambulation/Gait   Gait Pattern  Within Functional Limits                Objective measurements completed on examination: See above findings.    Pelvic Floor Special Questions - 12/27/19 0001    Prior Pelvic/Prostate Exam  Yes    Are you Pregnant or attempting pregnancy?  No    Number of Pregnancies  1    Number of C-Sections  1    Currently Sexually Active  Yes    Is this Painful  Yes    Marinoff Scale  pain interrupts completion    Fecal incontinence  --   sometimes contsipation, not bad   Skin Integrity  Intact    Pelvic Floor Internal Exam  pt identity confirmed and informed consent given to perform internal assessment    Exam Type  Vaginal    Palpation  high tone levators and pubococcygeus; TTP throughout    Strength  weak squeeze, no lift    Strength #  of seconds  1    Tone  high                    PT Long Term Goals - 12/27/19 4332      PT LONG TERM GOAL #1   Title  ind with HEP to manage pain    Time  8    Period  Weeks    Status  New    Target Date  02/20/20      PT LONG TERM GOAL #2   Title  pt will be able to sit and lie down as long as necessary to perform activities and get adquate sleep.    Baseline  can sit and lie down for 20-30 minutes at a time    Time  8    Period  Weeks    Status  New    Target Date  02/20/20      PT LONG TERM GOAL #3   Title  Pt will report <3/10 pain at the most due to improved ability to stretch and relax pelvic floor    Baseline  5/10    Time  8    Period  Weeks    Status  New    Target Date  02/20/20      PT LONG TERM GOAL #4   Title  Pt will report 1/3 or less on Marinoff scale    Baseline  2/3    Time  8    Period  Weeks    Status  New    Target Date  02/20/20             Plan - 12/27/19 0807    Clinical Impression Statement  Pt presents to clinic due to tailbone pain that she had since pregnancy.  Pt has difficulty sitting and lying down with pain of 5/10.  She has weakness in LE and tight hamstrings as mentioned above.  Pt has pelvic floor weakness of 2/5 MMT.  Marinoff scale is 2/3 pain with intercourse.  Pt has elevated tone of pelvic floor and TTP throughout bilateral levators and pubococcygeus.  Pt will  benefit from skilled PT to address these impairments to return to maximum function and quality of life.    Stability/Clinical Decision Making  Stable/Uncomplicated    Clinical Decision Making  Low    Rehab Potential  Excellent    PT Frequency  2x / week    PT Duration  8 weeks    PT Treatment/Interventions  ADLs/Self Care Home Management;Biofeedback;Cryotherapy;Electrical Stimulation;Moist Heat;Ultrasound;Therapeutic activities;Therapeutic exercise;Neuromuscular re-education;Patient/family education;Manual techniques;Dry needling;Passive range of  motion;Taping    PT Next Visit Plan  internal STM, biofeedback    PT Home Exercise Plan  start breathing and bulging and stretches    Consulted and Agree with Plan of Care  Patient       Patient will benefit from skilled therapeutic intervention in order to improve the following deficits and impairments:  Postural dysfunction, Impaired tone, Pain, Decreased coordination, Increased fascial restricitons, Decreased strength  Visit Diagnosis: Cramp and spasm  Muscle weakness (generalized)     Problem List Patient Active Problem List   Diagnosis Date Noted  . Labial lesion 08/07/2019  . S/P cesarean section for FTP 06/03/2019  . Common migraine with intractable migraine 09/19/2018  . Seizure ([place]) 01/11/2018  . Marijuana abuse     Jule Ser, PT 12/27/2019, 8:33 AM  [facility] [address], [place], [place], [address] Phone: [phone]   Fax:  [phone]  Name: KEIRSTIN MUSIL MRN: [id number] Date of Birth: 04-Sep-1991  PHYSICAL THERAPY DISCHARGE SUMMARY  Visits from Start of Care: 1  Current functional level related to goals / functional outcomes: Only came to eval   Remaining deficits: See above   Education / Equipment: eval only  Plan: Patient agrees to discharge.  Patient goals were not met. Patient is being discharged due to not returning since the last visit.  ?????     [employer], PT 01/14/20 3:49 PM

## 2020-01-07 ENCOUNTER — Ambulatory Visit: Payer: Medicaid Other | Admitting: Physical Therapy

## 2020-01-07 ENCOUNTER — Telehealth: Payer: Self-pay | Admitting: Physical Therapy

## 2020-01-07 NOTE — Telephone Encounter (Signed)
Patient did not show for appointment.  Patient was called and PT left message to please call us back.  Russella Dar, PT 01/07/20 4:02 PM

## 2020-01-14 ENCOUNTER — Ambulatory Visit: Payer: Medicaid Other | Admitting: Physical Therapy

## 2020-01-20 ENCOUNTER — Telehealth: Payer: Self-pay | Admitting: Neurology

## 2020-01-20 ENCOUNTER — Ambulatory Visit: Payer: Medicaid Other | Admitting: Neurology

## 2020-01-20 ENCOUNTER — Encounter: Payer: Self-pay | Admitting: Neurology

## 2020-01-20 ENCOUNTER — Encounter: Payer: Self-pay | Admitting: *Deleted

## 2020-01-20 ENCOUNTER — Other Ambulatory Visit: Payer: Self-pay

## 2020-01-20 VITALS — BP 105/71 | HR 71 | Temp 97.2°F | Ht 64.0 in | Wt 110.8 lb

## 2020-01-20 DIAGNOSIS — R569 Unspecified convulsions: Secondary | ICD-10-CM

## 2020-01-20 MED ORDER — LEVETIRACETAM ER 500 MG PO TB24: 1000.0000 mg | ORAL_TABLET | Freq: Every day | ORAL | 3 refills | Status: DC

## 2020-01-20 NOTE — Progress Notes (Signed)
PATIENT: Michelle Short DOB: 12-Aug-1991  REASON FOR VISIT: follow up HISTORY FROM: patient  HISTORY OF PRESENT ILLNESS: Today 01/20/20  Michelle Short is a 29 year old female with history of migraine headaches and seizures.  She had recurrent nocturnal seizure last night 01/20/2020.  She woke up around 7 AM, had bitten her tongue, no urinary incontinence.  Yesterday, she missed both doses of her Keppra.  She has been having issues taking the medication twice daily.  She has a 21-month-old baby, is still breast-feeding.  She thinks it could have triggered by increased stress, sleep deprivation.  She works second shift.  Today, she feels weak, and tired as result of the seizure.  She lives with her mom and siblings.  She presents today for evaluation unaccompanied.  HISTORY  08/28/2019 SS: Michelle Short is a 29 year old female with history of migraine headaches and seizures.  Her last seizure occurred in December 2019, and was a nocturnal seizure.  At that time, she was pregnant and suffering from hyperemesis gravidarum and was unable to keep all her medications down.  She has since had her baby, who is 16 months old.  She is currently breast-feeding.  She has not had recurrent seizure.  She does report a few nights ago she felt she was going to have a seizure, felt dizzy, weak, which is often felt before a seizure event.  She has history of nocturnal seizures.  She is not sure at this time if she missed a dose of her medication.  She says she is generally compliant.  She remains on low-dose Keppra 250 mg twice a day.  She does not drive a car.  She does not have a driver's license.  She presents today for follow-up unaccompanied.  REVIEW OF SYSTEMS: Out of a complete 14 system review of symptoms, the patient complains only of the following symptoms, and all other reviewed systems are negative.  Seizure  ALLERGIES: Allergies  Allergen Reactions  . Ibuprofen Other (See Comments)    "interferes with  my seizure medication"    HOME MEDICATIONS: Outpatient Medications Prior to Visit  Medication Sig Dispense Refill  . levETIRAcetam (KEPPRA) 500 MG tablet Take 1 tablet (500 mg total) by mouth 2 (two) times daily. 60 tablet 11  . metroNIDAZOLE (METROGEL VAGINAL) 0.75 % vaginal gel Place 1 Applicatorful vaginally at bedtime. FOR 5 NIGHTS 70 g 0  . Prenatal Vit-Fe Fumarate-FA (MULTIVITAMIN-PRENATAL) 27-0.8 MG TABS tablet Take 1 tablet by mouth daily at 12 noon.    . valACYclovir (VALTREX) 1000 MG tablet Take 1 tablet (1,000 mg total) by mouth 2 (two) times daily. Take for ten days. 20 tablet 3   No facility-administered medications prior to visit.    PAST MEDICAL HISTORY: Past Medical History:  Diagnosis Date  . Common migraine with intractable migraine 09/19/2018  . Headache   . Marijuana abuse   . Seizures ([facility]) 12/2017    PAST SURGICAL HISTORY: Past Surgical History:  Procedure Laterality Date  . CESAREAN SECTION N/A 06/05/2019   Procedure: CESAREAN SECTION;  Surgeon: Tereso Newcomer, MD;  Location: [facility];  Service: Obstetrics;  Laterality: N/A;    FAMILY HISTORY: Family History  Problem Relation Age of Onset  . Hyperlipidemia Mother   . Diabetes Paternal Grandfather   . Cancer Maternal Grandmother        colon cancer  . Heart attack Maternal Grandfather     SOCIAL HISTORY: Social History   Socioeconomic History  . Marital status:  Single    Spouse name: Nickola Major  . Number of children: 1  . Years of education: 55  . Highest education level: Some college, no degree  Occupational History  . Not on file  Tobacco Use  . Smoking status: Never Smoker  . Smokeless tobacco: Never Used  Substance and Sexual Activity  . Alcohol use: No  . Drug use: Not Currently    Types: Marijuana  . Sexual activity: Not Currently    Birth control/protection: None  Other Topics Concern  . Not on file  Social History Narrative  . Not on file   Social Determinants of Health    Financial Resource Strain:   . Difficulty of Paying Living Expenses: Not on file  Food Insecurity:   . Worried About Charity fundraiser in the Last Year: Not on file  . Ran Out of Food in the Last Year: Not on file  Transportation Needs:   . Lack of Transportation (Medical): Not on file  . Lack of Transportation (Non-Medical): Not on file  Physical Activity:   . Days of Exercise per Week: Not on file  . Minutes of Exercise per Session: Not on file  Stress:   . Feeling of Stress : Not on file  Social Connections:   . Frequency of Communication with Friends and Family: Not on file  . Frequency of Social Gatherings with Friends and Family: Not on file  . Attends Religious Services: Not on file  . Active Member of Clubs or Organizations: Not on file  . Attends Archivist Meetings: Not on file  . Marital Status: Not on file  Intimate Partner Violence:   . Fear of Current or Ex-Partner: Not on file  . Emotionally Abused: Not on file  . Physically Abused: Not on file  . Sexually Abused: Not on file      PHYSICAL EXAM  Vitals:   01/20/20 1431  BP: 105/71  Pulse: 71  Temp: (!) 97.2 F (36.2 C)  TempSrc: Oral  Weight: 110 lb 12.8 oz (50.3 kg)  Height: 5\' 4"  (1.626 m)   Body mass index is 19.02 kg/m.  Generalized: Well developed, in no acute distress, bite to tongue bilaterally  Neurological examination  Mentation: Alert oriented to time, place, history taking. Follows all commands speech and language fluent Cranial nerve II-XII: Pupils were equal round reactive to light. Extraocular movements were full, visual field were full on confrontational test. Facial sensation and strength were normal. Head turning and shoulder shrug were normal and symmetric. Motor: The motor testing reveals 5 over 5 strength of all 4 extremities. Good symmetric motor tone is noted throughout.  Sensory: Sensory testing is intact to soft touch on all 4 extremities. No evidence of  extinction is noted.  Coordination: Cerebellar testing reveals good finger-nose-finger and heel-to-shin bilaterally.  Gait and station: Gait is normal. Tandem gait is slightly unsteady. Romberg is negative. No drift is seen.  Reflexes: Deep tendon reflexes are symmetric and normal bilaterally.   DIAGNOSTIC DATA (LABS, IMAGING, TESTING) - I reviewed patient records, labs, notes, testing and imaging myself where available.  Lab Results  Component Value Date   WBC 11.5 (H) 06/06/2019   HGB 8.4 (L) 06/06/2019   HCT 26.5 (L) 06/06/2019   MCV 92.7 06/06/2019   PLT 172 06/06/2019      Component Value Date/Time   NA 133 (L) 10/20/2018 1700   K 3.5 10/20/2018 1700   CL 105 10/20/2018 1700   CO2 21 (  L) 10/20/2018 1700   GLUCOSE 88 10/20/2018 1700   BUN 7 10/20/2018 1700   CREATININE 1.01 (H) 06/06/2019 0700   CALCIUM 9.1 10/20/2018 1700   PROT 7.2 10/20/2018 1700   ALBUMIN 4.1 10/20/2018 1700   AST 22 10/20/2018 1700   ALT 13 10/20/2018 1700   ALKPHOS 47 10/20/2018 1700   BILITOT 0.6 10/20/2018 1700   GFRNONAA >60 06/06/2019 0700   GFRAA >60 06/06/2019 0700   No results found for: CHOL, HDL, LDLCALC, LDLDIRECT, TRIG, CHOLHDL No results found for: VQMG8Q No results found for: VITAMINB12 No results found for: TSH    ASSESSMENT AND PLAN 29 y.o. year old female  has a past medical history of Common migraine with intractable migraine (09/19/2018), Headache, Marijuana abuse, and Seizures ([facility]) (12/2017). here with:  1.  History of seizures, recent seizure 01/20/2020  2.  Breast-feeding  She had a recent seizure last night, in the setting of medication noncompliance, she missed both doses of Keppra yesterday.  She will remain on Keppra at current dose, but I will switch her to extended release, 500 mg, 2 tablets at bedtime.  I do not see any information where there should be any adverse effect with the conversion to extended release in regards to breast-feeding.  She is not to operate a  motor vehicle for 6 months.  She will call for recurrent seizure, otherwise follow-up at her next appointment.  I written her out of work for the next 3 days.  She does not operate heavy equipment, or climb heights for her occupation.   I spent 15 minutes with the patient. 50% of this time was spent discussing her plan of care.   Margie Ege, AGNP-C, DNP 01/20/2020, 2:51 PM [facility] [address] [place], [place] [address] [phone]

## 2020-01-20 NOTE — Patient Instructions (Addendum)
Switch to Keppra Extended release 500 mg tablet, take 2 at bedtime, take every night, do not miss any doses!  No driving for 6 months   Call for seizure, return in 6 months

## 2020-01-20 NOTE — Telephone Encounter (Signed)
Pt has called to report she had a seizure this morning.  Pt states she has a weird headache, her neck hurts and she woke up to a mouth full of blood because she bit her tongue, please call

## 2020-01-20 NOTE — Progress Notes (Signed)
I have read the note, and I agree with the clinical assessment and plan.  Ida Uppal K Elic Vencill   

## 2020-01-20 NOTE — Telephone Encounter (Signed)
I called pt.  She woke up this am.  Noted had mouthful of blood, bit 2 areas tongue, neck hurt, slight headache. She states legs weak.  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 Triggers stress, lack of sleep and she missed her doses of keppra 500mg  po bid.  She took her medication this am.  I made appt for her to come in this afternoon 1445.

## 2020-01-21 ENCOUNTER — Encounter: Payer: Medicaid Other | Admitting: Physical Therapy

## 2020-01-23 ENCOUNTER — Encounter: Payer: Self-pay | Admitting: *Deleted

## 2020-01-23 NOTE — Telephone Encounter (Signed)
Pt has called back to inform RN that she would like a note to excuse her from work until Monday, she does not believe she will feel up to returning to work on tomorrow.  Please call

## 2020-01-23 NOTE — Telephone Encounter (Signed)
It is okay to give her a note to return to work on Monday, however at this point, she should really be much improved.

## 2020-01-23 NOTE — Telephone Encounter (Signed)
I called pt and relayed that SS/NP was ok to do until Monday.  She stated that she felt really sore.  I will send via mychart.

## 2020-02-25 NOTE — Progress Notes (Deleted)
PATIENT: Michelle Short DOB: [birth year]  REASON FOR VISIT: follow up HISTORY FROM: patient  HISTORY OF PRESENT ILLNESS: Today 02/25/20  Ms. Hench is a 29 year old female with history of migraine headaches and seizures.  She had a nocturnal seizure on 01/20/2020.  She had woken up, had bit her tongue.  The day before she missed both doses of Keppra.  She is breast-feeding.  She was converted to extended release Keppra 500 mg, 2 tablets at bedtime.  HISTORY 01/20/2020 SS: Ms. Charlie is a 29 year old female with history of migraine headaches and seizures.  She had recurrent nocturnal seizure last night 01/20/2020.  She woke up around 7 AM, had bitten her tongue, no urinary incontinence.  Yesterday, she missed both doses of her Keppra.  She has been having issues taking the medication twice daily.  She has a 55-month-old baby, is still breast-feeding.  She thinks it could have triggered by increased stress, sleep deprivation.  She works second shift.  Today, she feels weak, and tired as result of the seizure.  She lives with her mom and siblings.  She presents today for evaluation unaccompanied.  REVIEW OF SYSTEMS: Out of a complete 14 system review of symptoms, the patient complains only of the following symptoms, and all other reviewed systems are negative.  ALLERGIES: Allergies  Allergen Reactions  . Ibuprofen Other (See Comments)    "interferes with my seizure medication"    HOME MEDICATIONS: Outpatient Medications Prior to Visit  Medication Sig Dispense Refill  . levETIRAcetam (KEPPRA XR) 500 MG 24 hr tablet Take 2 tablets (1,000 mg total) by mouth at bedtime. 180 tablet 3  . metroNIDAZOLE (METROGEL VAGINAL) 0.75 % vaginal gel Place 1 Applicatorful vaginally at bedtime. FOR 5 NIGHTS 70 g 0  . Prenatal Vit-Fe Fumarate-FA (MULTIVITAMIN-PRENATAL) 27-0.8 MG TABS tablet Take 1 tablet by mouth daily at 12 noon.    . valACYclovir (VALTREX) 1000 MG tablet Take 1 tablet (1,000 mg total) by  mouth 2 (two) times daily. Take for ten days. 20 tablet 3   No facility-administered medications prior to visit.    PAST MEDICAL HISTORY: Past Medical History:  Diagnosis Date  . Common migraine with intractable migraine 09/19/2018  . Headache   . Marijuana abuse   . Seizures ([place]) 12/2017    PAST SURGICAL HISTORY: Past Surgical History:  Procedure Laterality Date  . CESAREAN SECTION N/A 06/05/2019   Procedure: CESAREAN SECTION;  Surgeon: Osborne Oman, MD;  Location: [facility];  Service: Obstetrics;  Laterality: N/A;    FAMILY HISTORY: Family History  Problem Relation Age of Onset  . Hyperlipidemia Mother   . Diabetes Paternal Grandfather   . Cancer Maternal Grandmother        colon cancer  . Heart attack Maternal Grandfather     SOCIAL HISTORY: Social History   Socioeconomic History  . Marital status: Single    Spouse name: Nickola Major  . Number of children: 1  . Years of education: 66  . Highest education level: Some college, no degree  Occupational History  . Not on file  Tobacco Use  . Smoking status: Never Smoker  . Smokeless tobacco: Never Used  Substance and Sexual Activity  . Alcohol use: No  . Drug use: Not Currently    Types: Marijuana  . Sexual activity: Not Currently    Birth control/protection: None  Other Topics Concern  . Not on file  Social History Narrative  . Not on file   Social  Determinants of Health   Financial Resource Strain:   . Difficulty of Paying Living Expenses:   Food Insecurity:   . Worried About Programme researcher, broadcasting/film/video in the Last Year:   . Barista in the Last Year:   Transportation Needs:   . Freight forwarder (Medical):   Marland Kitchen Lack of Transportation (Non-Medical):   Physical Activity:   . Days of Exercise per Week:   . Minutes of Exercise per Session:   Stress:   . Feeling of Stress :   Social Connections:   . Frequency of Communication with Friends and Family:   . Frequency of Social Gatherings with  Friends and Family:   . Attends Religious Services:   . Active Member of Clubs or Organizations:   . Attends Banker Meetings:   Marland Kitchen Marital Status:   Intimate Partner Violence:   . Fear of Current or Ex-Partner:   . Emotionally Abused:   Marland Kitchen Physically Abused:   . Sexually Abused:       PHYSICAL EXAM  There were no vitals filed for this visit. There is no height or weight on file to calculate BMI.  Generalized: Well developed, in no acute distress   Neurological examination  Mentation: Alert oriented to time, place, history taking. Follows all commands speech and language fluent Cranial nerve II-XII: Pupils were equal round reactive to light. Extraocular movements were full, visual field were full on confrontational test. Facial sensation and strength were normal. Uvula tongue midline. Head turning and shoulder shrug  were normal and symmetric. Motor: The motor testing reveals 5 over 5 strength of all 4 extremities. Good symmetric motor tone is noted throughout.  Sensory: Sensory testing is intact to soft touch on all 4 extremities. No evidence of extinction is noted.  Coordination: Cerebellar testing reveals good finger-nose-finger and heel-to-shin bilaterally.  Gait and station: Gait is normal. Tandem gait is normal. Romberg is negative. No drift is seen.  Reflexes: Deep tendon reflexes are symmetric and normal bilaterally.   DIAGNOSTIC DATA (LABS, IMAGING, TESTING) - I reviewed patient records, labs, notes, testing and imaging myself where available.  Lab Results  Component Value Date   WBC 11.5 (H) 06/06/2019   HGB 8.4 (L) 06/06/2019   HCT 26.5 (L) 06/06/2019   MCV 92.7 06/06/2019   PLT 172 06/06/2019      Component Value Date/Time   NA 133 (L) 10/20/2018 1700   K 3.5 10/20/2018 1700   CL 105 10/20/2018 1700   CO2 21 (L) 10/20/2018 1700   GLUCOSE 88 10/20/2018 1700   BUN 7 10/20/2018 1700   CREATININE 1.01 (H) 06/06/2019 0700   CALCIUM 9.1 10/20/2018  1700   PROT 7.2 10/20/2018 1700   ALBUMIN 4.1 10/20/2018 1700   AST 22 10/20/2018 1700   ALT 13 10/20/2018 1700   ALKPHOS 47 10/20/2018 1700   BILITOT 0.6 10/20/2018 1700   GFRNONAA >60 06/06/2019 0700   GFRAA >60 06/06/2019 0700   No results found for: CHOL, HDL, LDLCALC, LDLDIRECT, TRIG, CHOLHDL No results found for: JYNW2N No results found for: VITAMINB12 No results found for: TSH    ASSESSMENT AND PLAN 29 y.o. year old female  has a past medical history of Common migraine with intractable migraine (09/19/2018), Headache, Marijuana abuse, and Seizures ([facility]) (12/2017). here with ***   I spent 15 minutes with the patient. 50% of this time was spent   Margie Ege, AGNP-C, DNP 02/25/2020, 4:35 PM [facility] [address] [place], [place] [address] [phone]

## 2020-02-26 ENCOUNTER — Ambulatory Visit: Payer: Medicaid Other | Admitting: Neurology

## 2020-03-03 ENCOUNTER — Ambulatory Visit: Payer: Medicaid Other | Admitting: Neurology

## 2020-04-18 ENCOUNTER — Encounter (HOSPITAL_COMMUNITY): Payer: Self-pay | Admitting: *Deleted

## 2020-04-18 ENCOUNTER — Emergency Department (HOSPITAL_COMMUNITY)
Admission: EM | Admit: 2020-04-18 | Discharge: 2020-04-18 | Disposition: A | Discharge status: Home / Self Care | Payer: Medicaid Other | Attending: Emergency Medicine | Admitting: Emergency Medicine

## 2020-04-18 ENCOUNTER — Other Ambulatory Visit: Payer: Self-pay

## 2020-04-18 DIAGNOSIS — W503XXA Accidental bite by another person, initial encounter: Secondary | ICD-10-CM | POA: Insufficient documentation

## 2020-04-18 DIAGNOSIS — R569 Unspecified convulsions: Secondary | ICD-10-CM

## 2020-04-18 DIAGNOSIS — G40909 Epilepsy, unspecified, not intractable, without status epilepticus: Secondary | ICD-10-CM | POA: Diagnosis not present

## 2020-04-18 DIAGNOSIS — Y9389 Activity, other specified: Secondary | ICD-10-CM | POA: Diagnosis not present

## 2020-04-18 DIAGNOSIS — Y92013 Bedroom of single-family (private) house as the place of occurrence of the external cause: Secondary | ICD-10-CM | POA: Insufficient documentation

## 2020-04-18 DIAGNOSIS — Y998 Other external cause status: Secondary | ICD-10-CM | POA: Diagnosis not present

## 2020-04-18 DIAGNOSIS — S032XXA Dislocation of tooth, initial encounter: Secondary | ICD-10-CM | POA: Diagnosis not present

## 2020-04-18 DIAGNOSIS — S0993XA Unspecified injury of face, initial encounter: Secondary | ICD-10-CM | POA: Diagnosis present

## 2020-04-18 LAB — CBG MONITORING, ED: Glucose-Capillary: 74 mg/dL (ref 70–99)

## 2020-04-18 MED ORDER — BACITRACIN ZINC 500 UNIT/GM EX OINT
TOPICAL_OINTMENT | CUTANEOUS | Status: AC
  Filled 2020-04-18: qty 0.9

## 2020-04-18 MED ORDER — AMOXICILLIN-POT CLAVULANATE 875-125 MG PO TABS: 1.0000 | ORAL_TABLET | Freq: Two times a day (BID) | ORAL | 0 refills | Status: DC

## 2020-04-18 MED ORDER — LEVETIRACETAM IN NACL 1000 MG/100ML IV SOLN
1000.0000 mg | Freq: Once | INTRAVENOUS | Status: AC
  Administered 2020-04-18: 1000 mg via INTRAVENOUS
  Filled 2020-04-18: qty 100

## 2020-04-18 MED ORDER — KETOROLAC TROMETHAMINE 30 MG/ML IJ SOLN
15.0000 mg | Freq: Once | INTRAMUSCULAR | Status: AC
  Administered 2020-04-18: 15 mg via INTRAVENOUS
  Filled 2020-04-18: qty 1

## 2020-04-18 NOTE — Discharge Instructions (Signed)
See your dentist on Monday regarding your loose tooth, until that time eat only soft foods such as applesauce, pudding, yogurt and drink plenty of liquids.  You should take Augmentin twice a day to treat the bite on your finger which may become infected, it may become infected even if you take the medication so please return for severe worsening pain swelling or fever or spreading redness.  Please make sure you take your Keppra as prescribed exactly as prescribed and do not miss any doses.  If you have further seizures which are worsening or becoming more frequent please return to the emergency department, otherwise see your doctor within 1 week for recheck of your seizures and the dosing of your Keppra

## 2020-04-18 NOTE — ED Provider Notes (Signed)
[facility] EMERGENCY DEPARTMENT Provider Note   CSN: [id number] Arrival date & time: 04/18/20  0940     History Chief Complaint  Patient presents with  . Seizures    Michelle Short is a 29 y.o. female.  HPI   29 year old female, history of seizures, she reports she had seizures after a head injury in 2019, she takes Keppra, states she has not missed any medications recently.  Takes what she thinks is 500 mg nightly.  She states that she was awakened from sleep this morning after likely having a seizure.  She had dental pain in her right lower canine had become dislodged.  She pushed it back into place, she had also bitten her tongue on the left and felt like she bit her finger.  She denies any other symptoms at this time, states that last night was normal, has not missed any meals, arrives by private vehicle.  Thinks she bit her finger as well.  Past Medical History:  Diagnosis Date  . Common migraine with intractable migraine 09/19/2018  . Headache   . Marijuana abuse   . Seizures (HCC) 12/2017    Patient Active Problem List   Diagnosis Date Noted  . Labial lesion 08/07/2019  . S/P cesarean section for FTP 06/03/2019  . Common migraine with intractable migraine 09/19/2018  . Seizure (HCC) 01/11/2018  . Marijuana abuse     Past Surgical History:  Procedure Laterality Date  . CESAREAN SECTION N/A 06/05/2019   Procedure: CESAREAN SECTION;  Surgeon: Michelle Newcomer, MD;  Location: [facility] LD ORS;  Service: Obstetrics;  Laterality: N/A;     OB History    Gravida  2   Para  1   Term  1   Preterm      AB  1   Living  1     SAB  1   TAB      Ectopic      Multiple  0   Live Births  1           Family History  Problem Relation Age of Onset  . Hyperlipidemia Mother   . Diabetes Paternal Grandfather   . Cancer Maternal Grandmother        colon cancer  . Heart attack Maternal Grandfather     Social History   Tobacco Use  . Smoking status: Never  Smoker  . Smokeless tobacco: Never Used  Substance Use Topics  . Alcohol use: No  . Drug use: Not Currently    Types: Marijuana    Home Medications Prior to Admission medications   Medication Sig Start Date End Date Taking? Authorizing Provider  levETIRAcetam (KEPPRA XR) 500 MG 24 hr tablet Take 2 tablets (1,000 mg total) by mouth at bedtime. 01/20/20  Yes Glean Salvo, NP  amoxicillin-clavulanate (AUGMENTIN) 875-125 MG tablet Take 1 tablet by mouth every 12 (twelve) hours. 04/18/20   Eber Hong, MD    Allergies    Ibuprofen  Review of Systems   Review of Systems  All other systems reviewed and are negative.   Physical Exam Updated Vital Signs BP 117/85 (BP Location: Right Arm)   Pulse 76   Temp 97.8 F (36.6 C) (Oral)   Resp 14   Ht 1.626 m (5\' 4" )   Wt 49 kg   LMP 03/02/2020   SpO2 98%   BMI 18.54 kg/m   Physical Exam Vitals and nursing note reviewed.  Constitutional:      General: She  is not in acute distress.    Appearance: She is well-developed.  HENT:     Head: Normocephalic and atraumatic.     Comments: No signs of trauma to the head, nontender scalp, maxillofacial structures are normal including the nasal bridge and the maxillofacial bones as well as the mandible.    Mouth/Throat:     Pharynx: No oropharyngeal exudate.     Comments: Dentition in good repair, right lower tooth with mild blood at the base, minimal subluxation, stable appearing, no fracture of the tooth, no malocclusion Eyes:     General: No scleral icterus.       Right eye: No discharge.        Left eye: No discharge.     Conjunctiva/sclera: Conjunctivae normal.     Pupils: Pupils are equal, round, and reactive to light.  Neck:     Thyroid: No thyromegaly.     Vascular: No JVD.  Cardiovascular:     Rate and Rhythm: Normal rate and regular rhythm.     Heart sounds: Normal heart sounds. No murmur. No friction rub. No gallop.   Pulmonary:     Effort: Pulmonary effort is normal. No  respiratory distress.     Breath sounds: Normal breath sounds. No wheezing or rales.  Abdominal:     General: Bowel sounds are normal. There is no distension.     Palpations: Abdomen is soft. There is no mass.     Tenderness: There is no abdominal tenderness.  Musculoskeletal:        General: No tenderness. Normal range of motion.     Cervical back: Normal range of motion and neck supple.     Comments: Thumb with mild bite mark, no sig wounds, abrasions  Lymphadenopathy:     Cervical: No cervical adenopathy.  Skin:    General: Skin is warm and dry.     Findings: No erythema or rash.  Neurological:     Mental Status: She is alert.     Coordination: Coordination normal.     Comments: The patient has normal speech and coordination she is able to stand out of the wheelchair pivot and sit on the bed, moving all 4 extremities with normal strength, normal level of alertness, normal coordination, no facial droop  Psychiatric:        Behavior: Behavior normal.     ED Results / Procedures / Treatments   Labs (all labs ordered are listed, but only abnormal results are displayed) Labs Reviewed  CBG MONITORING, ED    EKG None  Radiology No results found.  Procedures Procedures (including critical care time)  Medications Ordered in ED Medications  ketorolac (TORADOL) 30 MG/ML injection 15 mg (has no administration in time range)  levETIRAcetam (KEPPRA) IVPB 1000 mg/100 mL premix (0 mg Intravenous Stopped 04/18/20 1050)    ED Course  I have reviewed the triage vital signs and the nursing notes.  Pertinent labs & imaging results that were available during my care of the patient were reviewed by me and considered in my medical decision making (see chart for details).    MDM Rules/Calculators/A&P                      Rule out hypoglycemia, she may or may not of been taking her medications but appears well, will load with Keppra, observe in the emergency department, no indication  for advanced imaging or further work-up as she is otherwise well-appearing, mild injury to the tongue  does not need any suturing, tooth can be referred to dentistry, she is agreeable to this plan  The mother has now arrived, 11:20 AM, discussed with her about what happened, the patient had a seizure in her bed, was not feeling well [place], there was no witness seizure this morning, she is not good about taking her medications regularly and will miss doses from time to time, not sure if she took her medicine last night.  On repeat exam the patient is stable for discharge, protecting her airway, does not appear to be confused just tired and sleepy as expected.  She was given Keppra by IV, she will be given some intravenous Toradol for her headache, stressed the need for dental follow-up, mother and patient in agreement.  Finger bite - treated with 2wound care and abx, Augmentin for home.  Final Clinical Impression(s) / ED Diagnoses Final diagnoses:  Seizure ([place])  Tooth luxation, initial encounter  Human bite, initial encounter    Rx / DC Orders ED Discharge Orders         Ordered    amoxicillin-clavulanate (AUGMENTIN) 875-125 MG tablet  Every 12 hours     04/18/20 1127           Noemi Chapel, MD 04/18/20 1128

## 2020-04-18 NOTE — ED Triage Notes (Signed)
Pt states she had a seizure while she was sleeping and knocked one of her teeth loose

## 2020-05-08 ENCOUNTER — Other Ambulatory Visit: Payer: Self-pay

## 2020-05-08 ENCOUNTER — Emergency Department (HOSPITAL_COMMUNITY)
Admission: EM | Admit: 2020-05-08 | Discharge: 2020-05-08 | Disposition: A | Discharge status: Home / Self Care | Payer: Medicaid Other | Attending: Emergency Medicine | Admitting: Emergency Medicine

## 2020-05-08 ENCOUNTER — Encounter (HOSPITAL_COMMUNITY): Payer: Self-pay | Admitting: Emergency Medicine

## 2020-05-08 DIAGNOSIS — Z3A01 Less than 8 weeks gestation of pregnancy: Secondary | ICD-10-CM | POA: Diagnosis not present

## 2020-05-08 DIAGNOSIS — R112 Nausea with vomiting, unspecified: Secondary | ICD-10-CM

## 2020-05-08 DIAGNOSIS — O219 Vomiting of pregnancy, unspecified: Secondary | ICD-10-CM | POA: Diagnosis present

## 2020-05-08 DIAGNOSIS — O211 Hyperemesis gravidarum with metabolic disturbance: Secondary | ICD-10-CM | POA: Diagnosis not present

## 2020-05-08 DIAGNOSIS — O210 Mild hyperemesis gravidarum: Secondary | ICD-10-CM

## 2020-05-08 DIAGNOSIS — Z79899 Other long term (current) drug therapy: Secondary | ICD-10-CM | POA: Insufficient documentation

## 2020-05-08 LAB — URINALYSIS, ROUTINE W REFLEX MICROSCOPIC
Bilirubin Urine: NEGATIVE
Glucose, UA: NEGATIVE mg/dL
Ketones, ur: 20 mg/dL — AB
Nitrite: NEGATIVE
Protein, ur: NEGATIVE mg/dL
Specific Gravity, Urine: 1.017 (ref 1.005–1.030)
pH: 6 (ref 5.0–8.0)

## 2020-05-08 LAB — COMPREHENSIVE METABOLIC PANEL
ALT: 20 U/L (ref 0–44)
AST: 20 U/L (ref 15–41)
Albumin: 4.3 g/dL (ref 3.5–5.0)
Alkaline Phosphatase: 75 U/L (ref 38–126)
Anion gap: 11 (ref 5–15)
BUN: 8 mg/dL (ref 6–20)
CO2: 24 mmol/L (ref 22–32)
Calcium: 9 mg/dL (ref 8.9–10.3)
Chloride: 101 mmol/L (ref 98–111)
Creatinine, Ser: 0.7 mg/dL (ref 0.44–1.00)
GFR calc Af Amer: >60 mL/min (ref >60)
GFR calc non Af Amer: >60 mL/min (ref >60)
Glucose, Bld: 83 mg/dL (ref 70–99)
Potassium: 3.3 mmol/L — ABNORMAL LOW (ref 3.5–5.1)
Sodium: 136 mmol/L (ref 135–145)
Total Bilirubin: 1.3 mg/dL — ABNORMAL HIGH (ref 0.3–1.2)
Total Protein: 7.1 g/dL (ref 6.5–8.1)

## 2020-05-08 LAB — CBC WITH DIFFERENTIAL/PLATELET
Abs Immature Granulocytes: 0.01 10*3/uL (ref 0.00–0.07)
Basophils Absolute: 0 10*3/uL (ref 0.0–0.1)
Basophils Relative: 0 %
Eosinophils Absolute: 0 10*3/uL (ref 0.0–0.5)
Eosinophils Relative: 1 %
HCT: 40 % (ref 36.0–46.0)
Hemoglobin: 13 g/dL (ref 12.0–15.0)
Immature Granulocytes: 0 %
Lymphocytes Relative: 28 %
Lymphs Abs: 1.5 10*3/uL (ref 0.7–4.0)
MCH: 28.9 pg (ref 26.0–34.0)
MCHC: 32.5 g/dL (ref 30.0–36.0)
MCV: 88.9 fL (ref 80.0–100.0)
Monocytes Absolute: 0.3 10*3/uL (ref 0.1–1.0)
Monocytes Relative: 6 %
Neutro Abs: 3.4 10*3/uL (ref 1.7–7.7)
Neutrophils Relative %: 65 %
Platelets: 263 10*3/uL (ref 150–400)
RBC: 4.5 MIL/uL (ref 3.87–5.11)
RDW: 12.3 % (ref 11.5–15.5)
WBC: 5.3 10*3/uL (ref 4.0–10.5)
nRBC: 0 % (ref 0.0–0.2)

## 2020-05-08 LAB — I-STAT BETA HCG BLOOD, ED (MC, WL, AP ONLY): I-stat hCG, quantitative: >2000 m[IU]/mL — ABNORMAL HIGH (ref <5)

## 2020-05-08 MED ORDER — ONDANSETRON HCL 4 MG/2ML IJ SOLN
4.0000 mg | Freq: Once | INTRAMUSCULAR | Status: AC
  Administered 2020-05-08: 4 mg via INTRAVENOUS
  Filled 2020-05-08: qty 2

## 2020-05-08 MED ORDER — PRENATAL COMPLETE 14-0.4 MG PO TABS
1.0000 | ORAL_TABLET | Freq: Every day | ORAL | 1 refills | Status: DC
Start: 2020-05-08 — End: 2020-08-11

## 2020-05-08 MED ORDER — DOXYLAMINE SUCCINATE (SLEEP) 25 MG PO TABS: 12.5000 mg | ORAL_TABLET | Freq: Three times a day (TID) | ORAL | 0 refills | Status: DC | PRN

## 2020-05-08 MED ORDER — SODIUM CHLORIDE 0.9 % IV BOLUS (SEPSIS)
1000.0000 mL | Freq: Once | INTRAVENOUS | Status: AC
  Administered 2020-05-08: 1000 mL via INTRAVENOUS

## 2020-05-08 MED ORDER — ONDANSETRON 8 MG PO TBDP
8.0000 mg | ORAL_TABLET | Freq: Once | ORAL | Status: AC
  Administered 2020-05-08: 8 mg via ORAL
  Filled 2020-05-08: qty 1

## 2020-05-08 MED ORDER — ONDANSETRON 4 MG PO TBDP: ORAL_TABLET | ORAL | 0 refills | Status: DC

## 2020-05-08 MED ORDER — PYRIDOXINE HCL 25 MG PO TABS
25.0000 mg | ORAL_TABLET | Freq: Three times a day (TID) | ORAL | 0 refills | Status: DC | PRN
Start: 2020-05-08 — End: 2021-02-04

## 2020-05-08 NOTE — ED Provider Notes (Signed)
[facility] EMERGENCY DEPARTMENT Provider Note   CSN: [id number] Arrival date & time: 05/08/20  6333     History Chief Complaint  Patient presents with  . Emesis    Michelle Short is a 29 y.o. female.  The history is provided by the patient.  Emesis Severity:  Moderate Duration:  3 days Timing:  Intermittent Progression:  Worsening Chronicity:  New Relieved by:  Nothing Worsened by:  Liquids Associated symptoms: diarrhea   Associated symptoms: no abdominal pain and no fever    Patient with history of migraines, seizures presents with vomiting.  Patient recently started an antibiotic.  Soon after starting the medications, she began having nonbloody emesis.  She also reports loose nonbloody stools.  No abdominal pain.  No fevers.  She denies any coughing or shortness of breath    Past Medical History:  Diagnosis Date  . Common migraine with intractable migraine 09/19/2018  . Headache   . Marijuana abuse   . Seizures (HCC) 12/2017    Patient Active Problem List   Diagnosis Date Noted  . Labial lesion 08/07/2019  . S/P cesarean section for FTP 06/03/2019  . Common migraine with intractable migraine 09/19/2018  . Seizure (HCC) 01/11/2018  . Marijuana abuse     Past Surgical History:  Procedure Laterality Date  . CESAREAN SECTION N/A 06/05/2019   Procedure: CESAREAN SECTION;  Surgeon: Tereso Newcomer, MD;  Location: [facility] LD ORS;  Service: Obstetrics;  Laterality: N/A;     OB History    Gravida  2   Para  1   Term  1   Preterm      AB  1   Living  1     SAB  1   TAB      Ectopic      Multiple  0   Live Births  1           Family History  Problem Relation Age of Onset  . Hyperlipidemia Mother   . Diabetes Paternal Grandfather   . Cancer Maternal Grandmother        colon cancer  . Heart attack Maternal Grandfather     Social History   Tobacco Use  . Smoking status: Never Smoker  . Smokeless tobacco: Never Used  Vaping Use  .  Vaping Use: Never used  Substance Use Topics  . Alcohol use: No  . Drug use: Not Currently    Types: Marijuana    Home Medications Prior to Admission medications   Medication Sig Start Date End Date Taking? Authorizing Provider  amoxicillin-clavulanate (AUGMENTIN) 875-125 MG tablet Take 1 tablet by mouth every 12 (twelve) hours. 04/18/20   Eber Hong, MD  levETIRAcetam (KEPPRA XR) 500 MG 24 hr tablet Take 2 tablets (1,000 mg total) by mouth at bedtime. 01/20/20   Glean Salvo, NP    Allergies    Ibuprofen  Review of Systems   Review of Systems  Constitutional: Negative for fever.  Cardiovascular: Negative for chest pain.  Gastrointestinal: Positive for diarrhea and vomiting. Negative for abdominal pain.  Genitourinary: Negative for dysuria, vaginal bleeding and vaginal discharge.  Neurological: Negative for seizures.  All other systems reviewed and are negative.   Physical Exam Updated Vital Signs BP 115/69 (BP Location: Right Arm)   Pulse 61   Temp 98.3 F (36.8 C) (Oral)   Resp 18   Ht 1.626 m (5\' 4" )   Wt 49.4 kg   LMP  (LMP Unknown)  SpO2 100%   BMI 18.71 kg/m   Physical Exam CONSTITUTIONAL: Well developed/well nourished, well appearing HEAD: Normocephalic/atraumatic EYES: EOMI/PERRL, no icterus ENMT: Mucous membranes moist NECK: supple no meningeal signs SPINE/BACK:entire spine nontender CV: S1/S2 noted, no murmurs/rubs/gallops noted LUNGS: Lungs are clear to auscultation bilaterally, no apparent distress ABDOMEN: soft, nontender, no rebound or guarding, bowel sounds noted throughout abdomen GU:no cva tenderness NEURO: Pt is awake/alert/appropriate, moves all extremitiesx4.  No facial droop.  EXTREMITIES: pulses normal/equal, full ROM SKIN: warm, color normal, healing wound noted to right thumb.  No erythema, no drainage.  Full range of motion noted PSYCH: no abnormalities of mood noted, alert and oriented to situation  ED Results / Procedures /  Treatments   Labs (all labs ordered are listed, but only abnormal results are displayed) Labs Reviewed  COMPREHENSIVE METABOLIC PANEL - Abnormal; Notable for the following components:      Result Value   Potassium 3.3 (*)    Total Bilirubin 1.3 (*)    All other components within normal limits  I-STAT BETA HCG BLOOD, ED ([facility], [facility], [facility] ONLY) - Abnormal; Notable for the following components:   I-stat hCG, quantitative >2,000.0 (*)    All other components within normal limits  CBC WITH DIFFERENTIAL/PLATELET  URINALYSIS, ROUTINE W REFLEX MICROSCOPIC    EKG None  Radiology No results found.  Procedures Procedures   Medications Ordered in ED Medications  ondansetron (ZOFRAN-ODT) disintegrating tablet 8 mg (8 mg Oral Given 05/08/20 0531)  sodium chloride 0.9 % bolus 1,000 mL (1,000 mLs Intravenous New Bag/Given 05/08/20 0604)  ondansetron (ZOFRAN) injection 4 mg (4 mg Intravenous Given 05/08/20 5465)    ED Course  I have reviewed the triage vital signs and the nursing notes.  Pertinent labs  results that were available during my care of the patient were reviewed by me and considered in my medical decision making (see chart for details).    MDM Rules/Calculators/A&P                          Patient presents with vomiting.  She also reports some loose stools.  She reports this began after taking an antibiotic recently.  It appears patient had a seizure at the end of last month and had a dental injury as well as bit her thumb.  Antibiotics were given to help prevent infection.  She only started the medications approximately 3 days ago but was prescribed in the last month.  She reports the wound on her thumb is healing well and she has full range of motion.  Patient overall well-appearing.  We will give a dose of Zofran and reassess.  We will also check urinalysis and urine pregnancy 5:56 AM Pt vomited after oral zofran Plan for IV fluids/meds 6:59 AM Patient found to be  pregnant Denies abd pain/vag bleeding No focal ABD tenderness No indication for imaging She is on keppra, but tolerated well with previous pregnancy Anticipate d/c home Ordered PNV, B6 and pyrodoxine for hyperemesis She needs close OBGYN followup 7:06 AM Signed out to Dr Estell Harpin to ensure patient can take PO Final Clinical Impression(s) / ED Diagnoses Final diagnoses:  Non-intractable vomiting with nausea, unspecified vomiting type  Less than [redacted] weeks gestation of pregnancy  Hyperemesis gravidarum    Rx / DC Orders ED Discharge Orders         Ordered    Prenatal Vit-Fe Fumarate-FA (PRENATAL COMPLETE) 14-0.4 MG TABS  Daily at bedtime  Discontinue  Reprint     05/08/20 0655    pyridOXINE (VITAMIN B-6) 25 MG tablet  3 times daily PRN     Discontinue  Reprint     05/08/20 0659    doxylamine, Sleep, (UNISOM) 25 MG tablet  3 times daily PRN     Discontinue  Reprint     05/08/20 0659           Ripley Fraise, MD 05/08/20 [phone]

## 2020-05-08 NOTE — Discharge Instructions (Addendum)
Follow up with your ob-gyn md °

## 2020-05-08 NOTE — ED Notes (Signed)
Pt states she does not need to urinate at this time, aware of DO, call light w/in reach 

## 2020-05-08 NOTE — ED Notes (Signed)
Waiting for fluid bolus to administer before D/C.

## 2020-05-08 NOTE — ED Triage Notes (Signed)
Patient states that she has been taking an antibiotic and has not been able to eat or drink of the last few days. Patient states nausea and vomiting over the last few days.

## 2020-05-13 ENCOUNTER — Emergency Department (HOSPITAL_COMMUNITY)
Admission: EM | Admit: 2020-05-13 | Discharge: 2020-05-13 | Disposition: A | Discharge status: Home / Self Care | Payer: Medicaid Other | Attending: Emergency Medicine | Admitting: Emergency Medicine

## 2020-05-13 ENCOUNTER — Encounter (HOSPITAL_COMMUNITY): Payer: Self-pay

## 2020-05-13 ENCOUNTER — Other Ambulatory Visit: Payer: Self-pay

## 2020-05-13 DIAGNOSIS — Z3A01 Less than 8 weeks gestation of pregnancy: Secondary | ICD-10-CM | POA: Diagnosis not present

## 2020-05-13 DIAGNOSIS — O211 Hyperemesis gravidarum with metabolic disturbance: Secondary | ICD-10-CM | POA: Insufficient documentation

## 2020-05-13 DIAGNOSIS — O210 Mild hyperemesis gravidarum: Secondary | ICD-10-CM | POA: Diagnosis not present

## 2020-05-13 DIAGNOSIS — R1110 Vomiting, unspecified: Secondary | ICD-10-CM | POA: Diagnosis present

## 2020-05-13 LAB — COMPREHENSIVE METABOLIC PANEL
ALT: 20 U/L (ref 0–44)
AST: 18 U/L (ref 15–41)
Albumin: 4.5 g/dL (ref 3.5–5.0)
Alkaline Phosphatase: 71 U/L (ref 38–126)
Anion gap: 7 (ref 5–15)
BUN: 8 mg/dL (ref 6–20)
CO2: 25 mmol/L (ref 22–32)
Calcium: 9.2 mg/dL (ref 8.9–10.3)
Chloride: 104 mmol/L (ref 98–111)
Creatinine, Ser: 0.55 mg/dL (ref 0.44–1.00)
GFR calc Af Amer: >60 mL/min (ref >60)
GFR calc non Af Amer: >60 mL/min (ref >60)
Glucose, Bld: 80 mg/dL (ref 70–99)
Potassium: 3.3 mmol/L — ABNORMAL LOW (ref 3.5–5.1)
Sodium: 136 mmol/L (ref 135–145)
Total Bilirubin: 1.1 mg/dL (ref 0.3–1.2)
Total Protein: 7.5 g/dL (ref 6.5–8.1)

## 2020-05-13 LAB — URINALYSIS, ROUTINE W REFLEX MICROSCOPIC
Bacteria, UA: NONE SEEN
Bilirubin Urine: NEGATIVE
Glucose, UA: NEGATIVE mg/dL
Hgb urine dipstick: NEGATIVE
Ketones, ur: 80 mg/dL — AB
Leukocytes,Ua: NEGATIVE
Nitrite: NEGATIVE
Protein, ur: 30 mg/dL — AB
Specific Gravity, Urine: 1.028 (ref 1.005–1.030)
pH: 6 (ref 5.0–8.0)

## 2020-05-13 LAB — CBC
HCT: 39.6 % (ref 36.0–46.0)
Hemoglobin: 12.8 g/dL (ref 12.0–15.0)
MCH: 29 pg (ref 26.0–34.0)
MCHC: 32.3 g/dL (ref 30.0–36.0)
MCV: 89.6 fL (ref 80.0–100.0)
Platelets: 281 10*3/uL (ref 150–400)
RBC: 4.42 MIL/uL (ref 3.87–5.11)
RDW: 12.5 % (ref 11.5–15.5)
WBC: 6.6 10*3/uL (ref 4.0–10.5)
nRBC: 0 % (ref 0.0–0.2)

## 2020-05-13 LAB — LIPASE, BLOOD: Lipase: 23 U/L (ref 11–51)

## 2020-05-13 LAB — HCG, QUANTITATIVE, PREGNANCY: hCG, Beta Chain, Quant, S: 142298 m[IU]/mL — ABNORMAL HIGH (ref <5)

## 2020-05-13 MED ORDER — LACTATED RINGERS IV BOLUS
1000.0000 mL | Freq: Once | INTRAVENOUS | Status: AC
  Administered 2020-05-13: 1000 mL via INTRAVENOUS

## 2020-05-13 MED ORDER — PROMETHAZINE HCL 25 MG PO TABS
25.0000 mg | ORAL_TABLET | Freq: Three times a day (TID) | ORAL | 0 refills | Status: DC | PRN
Start: 2020-05-13 — End: 2020-08-11

## 2020-05-13 MED ORDER — PROMETHAZINE HCL 25 MG/ML IJ SOLN
25.0000 mg | Freq: Once | INTRAMUSCULAR | Status: AC
  Administered 2020-05-13: 25 mg via INTRAVENOUS

## 2020-05-13 MED ORDER — LEVETIRACETAM ER 500 MG PO TB24
1000.0000 mg | ORAL_TABLET | Freq: Once | ORAL | Status: AC
  Administered 2020-05-13: 1000 mg via ORAL
  Filled 2020-05-13: qty 2

## 2020-05-13 MED ORDER — METOCLOPRAMIDE HCL 5 MG/ML IJ SOLN
10.0000 mg | Freq: Once | INTRAMUSCULAR | Status: AC
  Administered 2020-05-13: 10 mg via INTRAVENOUS
  Filled 2020-05-13: qty 2

## 2020-05-13 MED ORDER — POTASSIUM CHLORIDE CRYS ER 20 MEQ PO TBCR
40.0000 meq | EXTENDED_RELEASE_TABLET | Freq: Once | ORAL | Status: AC
  Administered 2020-05-13: 40 meq via ORAL
  Filled 2020-05-13: qty 2

## 2020-05-13 MED ORDER — METOCLOPRAMIDE HCL 10 MG PO TABS
10.0000 mg | ORAL_TABLET | Freq: Three times a day (TID) | ORAL | 0 refills | Status: DC | PRN
Start: 2020-05-13 — End: 2020-08-11

## 2020-05-13 NOTE — Discharge Instructions (Signed)
If you develop worsening, continued, or recurrent abdominal pain, uncontrolled vomiting, fever, chest or back pain, or any other new/concerning symptoms then return to the ER for evaluation.   You may take the meds prescribed to you 5 days ago or the medicines I am prescribing tonight.  Either way you need to follow-up closely with your OB/GYN.

## 2020-05-13 NOTE — ED Provider Notes (Signed)
[facility] EMERGENCY DEPARTMENT Provider Note   CSN: [id number] Arrival date & time: 05/13/20  1638     History Chief Complaint  Patient presents with  . Emesis    Michelle Short is a 29 y.o. female.  HPI 29 year old female presents with vomiting.  Has been ongoing since she was here on 6/18.  She did not pick up the meds that were prescribed.  She vomits numerous times per day.  No fever or abdominal pain.  Some diarrhea.  Occasionally when vomiting a lot she will get some blood that is minimal.  She had hyperemesis with her other pregnancy.  She is currently estimated to be about [redacted] weeks pregnant.  No vaginal bleeding or discharge.  No urinary symptoms.  No flank pain.  She also has a history of seizures and has not taken her Keppra in a couple days because she is afraid it will stay down.  However she has not had a seizure since last month.   Past Medical History:  Diagnosis Date  . Common migraine with intractable migraine 09/19/2018  . Headache   . Marijuana abuse   . Seizures (HCC) 12/2017    Patient Active Problem List   Diagnosis Date Noted  . Labial lesion 08/07/2019  . S/P cesarean section for FTP 06/03/2019  . Common migraine with intractable migraine 09/19/2018  . Seizure (HCC) 01/11/2018  . Marijuana abuse     Past Surgical History:  Procedure Laterality Date  . CESAREAN SECTION N/A 06/05/2019   Procedure: CESAREAN SECTION;  Surgeon: Tereso Newcomer, MD;  Location: [facility] LD ORS;  Service: Obstetrics;  Laterality: N/A;     OB History    Gravida  3   Para  1   Term  1   Preterm      AB  1   Living  1     SAB  1   TAB      Ectopic      Multiple  0   Live Births  1           Family History  Problem Relation Age of Onset  . Hyperlipidemia Mother   . Diabetes Paternal Grandfather   . Cancer Maternal Grandmother        colon cancer  . Heart attack Maternal Grandfather     Social History   Tobacco Use  . Smoking status: Never  Smoker  . Smokeless tobacco: Never Used  Vaping Use  . Vaping Use: Never used  Substance Use Topics  . Alcohol use: No  . Drug use: Not Currently    Types: Marijuana    Home Medications Prior to Admission medications   Medication Sig Start Date End Date Taking? Authorizing Provider  amoxicillin-clavulanate (AUGMENTIN) 875-125 MG tablet Take 1 tablet by mouth every 12 (twelve) hours. 04/18/20   Eber Hong, MD  doxylamine, Sleep, (UNISOM) 25 MG tablet Take 0.5 tablets (12.5 mg total) by mouth 3 (three) times daily as needed. 05/08/20   Zadie Rhine, MD  levETIRAcetam (KEPPRA XR) 500 MG 24 hr tablet Take 2 tablets (1,000 mg total) by mouth at bedtime. Patient not taking: Reported on 05/13/2020 01/20/20   Glean Salvo, NP  levETIRAcetam (KEPPRA) 500 MG tablet Take 500 mg by mouth 2 (two) times daily. 05/02/20   [provider]  metoCLOPramide (REGLAN) 10 MG tablet Take 1 tablet (10 mg total) by mouth every 8 (eight) hours as needed for nausea. 05/13/20   Pricilla Loveless, MD  ondansetron (ZOFRAN ODT) 4 MG disintegrating tablet 4mg  ODT q4 hours prn nausea/vomit 05/08/20   05/10/20, MD  Prenatal Vit-Fe Fumarate-FA (PRENATAL COMPLETE) 14-0.4 MG TABS Take 1 tablet by mouth at bedtime. 05/08/20   05/10/20, MD  promethazine (PHENERGAN) 25 MG tablet Take 1 tablet (25 mg total) by mouth every 8 (eight) hours as needed for nausea or vomiting. 05/13/20   05/15/20, MD  pyridOXINE (VITAMIN B-6) 25 MG tablet Take 1 tablet (25 mg total) by mouth 3 (three) times daily as needed. 05/08/20   05/10/20, MD    Allergies    Ibuprofen  Review of Systems   Review of Systems  Constitutional: Negative for fever.  Gastrointestinal: Positive for nausea and vomiting. Negative for abdominal pain.  Genitourinary: Negative for dysuria, hematuria, vaginal bleeding and vaginal discharge.  Musculoskeletal: Negative for back pain.  Neurological: Negative for seizures.  All other  systems reviewed and are negative.   Physical Exam Updated Vital Signs BP 110/63   Pulse 79   Temp 98.4 F (36.9 C) (Oral)   Resp 16   Ht 5\' 4"  (1.626 m)   Wt 49.4 kg   LMP 04/09/2020   SpO2 99%   BMI 18.71 kg/m   Physical Exam Vitals and nursing note reviewed.  Constitutional:      Appearance: She is well-developed.  HENT:     Head: Normocephalic and atraumatic.     Right Ear: External ear normal.     Left Ear: External ear normal.     Nose: Nose normal.  Eyes:     General:        Right eye: No discharge.        Left eye: No discharge.  Cardiovascular:     Rate and Rhythm: Normal rate and regular rhythm.     Heart sounds: Normal heart sounds.  Pulmonary:     Effort: Pulmonary effort is normal.     Breath sounds: Normal breath sounds.  Abdominal:     Palpations: Abdomen is soft.     Tenderness: There is no abdominal tenderness. There is no right CVA tenderness or left CVA tenderness.  Skin:    General: Skin is warm and dry.  Neurological:     Mental Status: She is alert.  Psychiatric:        Mood and Affect: Mood is not anxious.     ED Results / Procedures / Treatments   Labs (all labs ordered are listed, but only abnormal results are displayed) Labs Reviewed  COMPREHENSIVE METABOLIC PANEL - Abnormal; Notable for the following components:      Result Value   Potassium 3.3 (*)    All other components within normal limits  HCG, QUANTITATIVE, PREGNANCY - Abnormal; Notable for the following components:   hCG, Beta Chain, Quant, S 142,298 (*)    All other components within normal limits  URINALYSIS, ROUTINE W REFLEX MICROSCOPIC - Abnormal; Notable for the following components:   APPearance HAZY (*)    Ketones, ur 80 (*)    Protein, ur 30 (*)    All other components within normal limits  CBC  LIPASE, BLOOD    EKG None  Radiology No results found.  Procedures Procedures (including critical care time)  Medications Ordered in ED Medications    lactated ringers bolus 1,000 mL (0 mLs Intravenous Stopped 05/13/20 2011)  metoCLOPramide (REGLAN) injection 10 mg (10 mg Intravenous Given 05/13/20 1923)  potassium chloride SA (KLOR-CON) CR tablet 40 mEq (40 mEq  Oral Given 05/13/20 2010)  levETIRAcetam (KEPPRA XR) 24 hr tablet 1,000 mg (1,000 mg Oral Given 05/13/20 2152)  promethazine (PHENERGAN) injection 25 mg (25 mg Intravenous Given 05/13/20 2129)  lactated ringers bolus 1,000 mL (0 mLs Intravenous Stopped 05/13/20 2315)    ED Course  I have reviewed the triage vital signs and the nursing notes.  Pertinent labs & imaging results that were available during my care of the patient were reviewed by me and considered in my medical decision making (see chart for details).    MDM Rules/Calculators/A&P                          Patient was given 2 L IV fluid.  Her vital signs have normalized.  She has ketonuria and mild hypokalemia.  Her exam is otherwise pretty unremarkable.  She has not had any seizures.  She was able to tolerate potassium as well as her Keppra.  She has had one episode of emesis in the ED but otherwise seems to be controlled.  She was prescribed meds 5 days ago but did not take them or fill them.  She should either fill these or I will also give her Phenergan/Reglan as prescription.  Follow-up with OB.  Return precautions. Final Clinical Impression(s) / ED Diagnoses Final diagnoses:  Hyperemesis gravidarum    Rx / DC Orders ED Discharge Orders         Ordered    promethazine (PHENERGAN) 25 MG tablet  Every 8 hours PRN     Discontinue  Reprint     05/13/20 2309    metoCLOPramide (REGLAN) 10 MG tablet  Every 8 hours PRN     Discontinue  Reprint     05/13/20 2309           Sherwood Gambler, MD 05/13/20 2320

## 2020-05-13 NOTE — ED Triage Notes (Signed)
Pt is [redacted] weeks pregnant. She has a history of seizures. Pt has been throwing up for the last 4 days. She takes Keppra daily, but hasn't taken it in the last 3 days. When patient forgets to take medicine she usually has bad seizures. She has not had an aura, but wanted to come and be examined.

## 2020-05-13 NOTE — ED Notes (Signed)
Called AC for keppra xr

## 2020-05-13 NOTE — ED Notes (Signed)
Patient states that she is starting to feel nauseated again. Not able to give urine specimen.

## 2020-05-15 ENCOUNTER — Ambulatory Visit (INDEPENDENT_AMBULATORY_CARE_PROVIDER_SITE_OTHER): Payer: Medicaid Other | Admitting: Women's Health

## 2020-05-15 ENCOUNTER — Other Ambulatory Visit: Payer: Self-pay

## 2020-05-15 ENCOUNTER — Encounter: Payer: Self-pay | Admitting: Women's Health

## 2020-05-15 VITALS — BP 111/73 | HR 93 | Ht 64.0 in | Wt 105.0 lb

## 2020-05-15 DIAGNOSIS — O219 Vomiting of pregnancy, unspecified: Secondary | ICD-10-CM

## 2020-05-15 DIAGNOSIS — G40909 Epilepsy, unspecified, not intractable, without status epilepticus: Secondary | ICD-10-CM

## 2020-05-15 DIAGNOSIS — Z3A01 Less than 8 weeks gestation of pregnancy: Secondary | ICD-10-CM

## 2020-05-15 DIAGNOSIS — O99351 Diseases of the nervous system complicating pregnancy, first trimester: Secondary | ICD-10-CM

## 2020-05-15 DIAGNOSIS — Z3491 Encounter for supervision of normal pregnancy, unspecified, first trimester: Secondary | ICD-10-CM

## 2020-05-15 MED ORDER — SCOPOLAMINE 1 MG/3DAYS TD PT72: 1.0000 | MEDICATED_PATCH | TRANSDERMAL | 3 refills | Status: DC

## 2020-05-15 NOTE — Progress Notes (Signed)
   GYN VISIT Patient name: Michelle Short MRN [id number]  Date of birth: [birth year] Chief Complaint:   Follow-up (pt just wants to talk to Selena Batten)  History of Present Illness:   Michelle Short is a 29 y.o. G2P1011 African American female being seen today for report of being pregnant, uncertain LMP- sometime around 5/20. Has been very sick w/ multiple ER visits, has rx for phenergan, zofran and reglan. Hasn't started phenergan. Still has suppositories at home from last pregnancy she can use. Is on keppra 500mg  BID for epilepsy, had a bad seizure about a month ago. Last saw neuro 01/20/20. Isn't able to keep keppra down most doses. Unsure if she wants to keep the pregnancy.      Depression screen [facility] 2/9 07/30/2019 10/31/2018  Decreased Interest 0 0  Down, Depressed, Hopeless 0 0  PHQ - 2 Score 0 0  Altered sleeping - 0  Tired, decreased energy - 0  Change in appetite - 0  Feeling bad or failure about yourself  - 0  Trouble concentrating - 0  Moving slowly or fidgety/restless - 0  PHQ-9 Score - 0    Patient's last menstrual period was 04/09/2020 (within days). The current method of family planning is none.  Review of Systems:   Pertinent items are noted in HPI Denies fever/chills, dizziness, headaches, visual disturbances, fatigue, shortness of breath, chest pain, abdominal pain, vomiting, abnormal vaginal discharge/itching/odor/irritation, problems with periods, bowel movements, urination, or intercourse unless otherwise stated above.  Pertinent History Reviewed:  Reviewed past medical,surgical, social, obstetrical and family history.  Reviewed problem list, medications and allergies. Physical Assessment:   Vitals:   05/15/20 0922  BP: 111/73  Pulse: 93  Weight: 105 lb (47.6 kg)  Height: 5\' 4"  (1.626 m)  Body mass index is 18.02 kg/m.       Physical Examination:   General appearance: alert, well appearing, and in no distress  Mental status: alert, oriented to person, place, and  time  Skin: warm & dry   Cardiovascular: normal heart rate noted  Respiratory: normal respiratory effort, no distress  Abdomen: soft, non-tender   Pelvic: examination not indicated  Extremities: no edema   Chaperone: n/a    No results found for this or any previous visit (from the past 24 hour(s)).  Assessment & Plan:  1) ~[redacted]wks pregnant based on uncertain LMP> will schedule for dating u/s in 2wks. Rx scopolamine patch. Keep taking zofran, reglan. Add phenergan as needed, can use suppositories. If decides for termination, cancel u/s appt.   2) Epilepsy> make appt w/ [facility] neurology asap  Meds:  Meds ordered this encounter  Medications  . scopolamine (TRANSDERM-SCOP) 1 MG/3DAYS    Sig: Place 1 patch (1.5 mg total) onto the skin every 3 (three) days.    Dispense:  10 patch    Refill:  3    Order Specific Question:   Supervising Provider    Answer:   04-12-2001 [[id number]]    Orders Placed This Encounter  Procedures  . OB Comp Less 14 Wks    Return in about 2 weeks (around 05/29/2020) for dating u/s.  [place] CNM, [facility] 05/15/2020 10:32 AM

## 2020-05-15 NOTE — Patient Instructions (Signed)
Michelle Short, I greatly value your feedback.  If you receive a survey following your visit with [place] today, we appreciate you taking the time to fill it out.  Thanks, [name], CNM, WHNP-BC   [facility] ([address] [place], [place] [address]) Entrance C, located off of [address] Free 24/7 valet parking   Nausea & Vomiting  Have saltine crackers or pretzels by your bed and eat a few bites before you raise your head out of bed in the morning  Eat small frequent meals throughout the day instead of large meals  Drink plenty of fluids throughout the day to stay hydrated, just don't drink a lot of fluids with your meals.  This can make your stomach fill up faster making you feel sick  Do not brush your teeth right after you eat  Products with real ginger are good for nausea, like ginger ale and ginger hard candy Make sure it says made with real ginger!  Sucking on sour candy like lemon heads is also good for nausea  If your prenatal vitamins make you nauseated, take them at night so you will sleep through the nausea  Sea Bands  If you feel like you need medicine for the nausea & vomiting please let us know  If you are unable to keep any fluids or food down please let us know   Constipation  Drink plenty of fluid, preferably water, throughout the day  Eat foods high in fiber such as fruits, vegetables, and grains  Exercise, such as walking, is a good way to keep your bowels regular  Drink warm fluids, especially warm prune juice, or decaf coffee  Eat a 1/2 cup of real oatmeal (not instant), 1/2 cup applesauce, and 1/2-1 cup warm prune juice every day  If needed, you may take Colace (docusate sodium) stool softener once or twice a day to help keep the stool soft.   If you still are having problems with constipation, you may take Miralax once daily as needed to help keep your bowels regular.     First Trimester of Pregnancy The first  trimester of pregnancy is from week 1 until the end of week 12 (months 1 through 3). A week after a sperm fertilizes an egg, the egg will implant on the wall of the uterus. This embryo will begin to develop into a baby. Genes from you and your partner are forming the baby. The female genes determine whether the baby is a boy or a girl. At 6-8 weeks, the eyes and face are formed, and the heartbeat can be seen on ultrasound. At the end of 12 weeks, all the baby's organs are formed.  Now that you are pregnant, you will want to do everything you can to have a healthy baby. Two of the most important things are to get good prenatal care and to follow your health care provider's instructions. Prenatal care is all the medical care you receive before the baby's birth. This care will help prevent, find, and treat any problems during the pregnancy and childbirth. BODY CHANGES Your body goes through many changes during pregnancy. The changes vary from woman to woman.   You may gain or lose a couple of pounds at first.  You may feel sick to your stomach (nauseous) and throw up (vomit). If the vomiting is uncontrollable, call your health care provider.  You may tire easily.  You may develop headaches that can be relieved  by medicines approved by your health care provider.  You may urinate more often. Painful urination may mean you have a bladder infection.  You may develop heartburn as a result of your pregnancy.  You may develop constipation because certain hormones are causing the muscles that push waste through your intestines to slow down.  You may develop hemorrhoids or swollen, bulging veins (varicose veins).  Your breasts may begin to grow larger and become tender. Your nipples may stick out more, and the tissue that surrounds them (areola) may become darker.  Your gums may bleed and may be sensitive to brushing and flossing.  Dark spots or blotches (chloasma, mask of pregnancy) may develop on your  face. This will likely fade after the baby is born.  Your menstrual periods will stop.  You may have a loss of appetite.  You may develop cravings for certain kinds of food.  You may have changes in your emotions from day to day, such as being excited to be pregnant or being concerned that something may go wrong with the pregnancy and baby.  You may have more vivid and strange dreams.  You may have changes in your hair. These can include thickening of your hair, rapid growth, and changes in texture. Some women also have hair loss during or after pregnancy, or hair that feels dry or thin. Your hair will most likely return to normal after your baby is born. WHAT TO EXPECT AT YOUR PRENATAL VISITS During a routine prenatal visit:  You will be weighed to make sure you and the baby are growing normally.  Your blood pressure will be taken.  Your abdomen will be measured to track your baby's growth.  The fetal heartbeat will be listened to starting around week 10 or 12 of your pregnancy.  Test results from any previous visits will be discussed. Your health care provider may ask you:  How you are feeling.  If you are feeling the baby move.  If you have had any abnormal symptoms, such as leaking fluid, bleeding, severe headaches, or abdominal cramping.  If you have any questions. Other tests that may be performed during your first trimester include:  Blood tests to find your blood type and to check for the presence of any previous infections. They will also be used to check for low iron levels (anemia) and Rh antibodies. Later in the pregnancy, blood tests for diabetes will be done along with other tests if problems develop.  Urine tests to check for infections, diabetes, or protein in the urine.  An ultrasound to confirm the proper growth and development of the baby.  An amniocentesis to check for possible genetic problems.  Fetal screens for spina bifida and Down syndrome.  You  may need other tests to make sure you and the baby are doing well. HOME CARE INSTRUCTIONS  Medicines  Follow your health care provider's instructions regarding medicine use. Specific medicines may be either safe or unsafe to take during pregnancy.  Take your prenatal vitamins as directed.  If you develop constipation, try taking a stool softener if your health care provider approves. Diet  Eat regular, well-balanced meals. Choose a variety of foods, such as meat or vegetable-based protein, fish, milk and low-fat dairy products, vegetables, fruits, and whole grain breads and cereals. Your health care provider will help you determine the amount of weight gain that is right for you.  Avoid raw meat and uncooked cheese. These carry germs that can cause birth defects in  the baby.  Eating four or five small meals rather than three large meals a day may help relieve nausea and vomiting. If you start to feel nauseous, eating a few soda crackers can be helpful. Drinking liquids between meals instead of during meals also seems to help nausea and vomiting.  If you develop constipation, eat more high-fiber foods, such as fresh vegetables or fruit and whole grains. Drink enough fluids to keep your urine clear or pale yellow. Activity and Exercise  Exercise only as directed by your health care provider. Exercising will help you:  Control your weight.  Stay in shape.  Be prepared for labor and delivery.  Experiencing pain or cramping in the lower abdomen or low back is a good sign that you should stop exercising. Check with your health care provider before continuing normal exercises.  Try to avoid standing for long periods of time. Move your legs often if you must stand in one place for a long time.  Avoid heavy lifting.  Wear low-heeled shoes, and practice good posture.  You may continue to have sex unless your health care provider directs you otherwise. Relief of Pain or Discomfort  Wear a  good support bra for breast tenderness.    Take warm sitz baths to soothe any pain or discomfort caused by hemorrhoids. Use hemorrhoid cream if your health care provider approves.    Rest with your legs elevated if you have leg cramps or low back pain.  If you develop varicose veins in your legs, wear support hose. Elevate your feet for 15 minutes, 3-4 times a day. Limit salt in your diet. Prenatal Care  Schedule your prenatal visits by the twelfth week of pregnancy. They are usually scheduled monthly at first, then more often in the last 2 months before delivery.  Write down your questions. Take them to your prenatal visits.  Keep all your prenatal visits as directed by your health care provider. Safety  Wear your seat belt at all times when driving.  Make a list of emergency phone numbers, including numbers for family, friends, the hospital, and police and fire departments. General Tips  Ask your health care provider for a referral to a local prenatal education class. Begin classes no later than at the beginning of month 6 of your pregnancy.  Ask for help if you have counseling or nutritional needs during pregnancy. Your health care provider can offer advice or refer you to specialists for help with various needs.  Do not use hot tubs, steam rooms, or saunas.  Do not douche or use tampons or scented sanitary pads.  Do not cross your legs for long periods of time.  Avoid cat litter boxes and soil used by cats. These carry germs that can cause birth defects in the baby and possibly loss of the fetus by miscarriage or stillbirth.  Avoid all smoking, herbs, alcohol, and medicines not prescribed by your health care provider. Chemicals in these affect the formation and growth of the baby.  Schedule a dentist appointment. At home, brush your teeth with a soft toothbrush and be gentle when you floss. SEEK MEDICAL CARE IF:   You have dizziness.  You have mild pelvic cramps, pelvic  pressure, or nagging pain in the abdominal area.  You have persistent nausea, vomiting, or diarrhea.  You have a bad smelling vaginal discharge.  You have pain with urination.  You notice increased swelling in your face, hands, legs, or ankles. SEEK IMMEDIATE MEDICAL CARE IF:  You have a fever.  You are leaking fluid from your vagina.  You have spotting or bleeding from your vagina.  You have severe abdominal cramping or pain.  You have rapid weight gain or loss.  You vomit blood or material that looks like coffee grounds.  You are exposed to [place] measles and have never had them.  You are exposed to fifth disease or chickenpox.  You develop a severe headache.  You have shortness of breath.  You have any kind of trauma, such as from a fall or a car accident. Document Released: 11/01/2001 Document Revised: 03/24/2014 Document Reviewed: 09/17/2013 [facility] Patient Information 2015 [place], [place]. This information is not intended to replace advice given to you by your health care provider. Make sure you discuss any questions you have with your health care provider.

## 2020-05-24 ENCOUNTER — Emergency Department (HOSPITAL_COMMUNITY): Payer: Medicaid Other

## 2020-05-24 ENCOUNTER — Other Ambulatory Visit: Payer: Self-pay

## 2020-05-24 ENCOUNTER — Encounter (HOSPITAL_COMMUNITY): Payer: Self-pay | Admitting: *Deleted

## 2020-05-24 ENCOUNTER — Emergency Department (HOSPITAL_COMMUNITY)
Admission: EM | Admit: 2020-05-24 | Discharge: 2020-05-24 | Disposition: A | Discharge status: Home / Self Care | Payer: Medicaid Other | Attending: Emergency Medicine | Admitting: Emergency Medicine

## 2020-05-24 DIAGNOSIS — O208 Other hemorrhage in early pregnancy: Secondary | ICD-10-CM | POA: Diagnosis present

## 2020-05-24 DIAGNOSIS — O039 Complete or unspecified spontaneous abortion without complication: Secondary | ICD-10-CM

## 2020-05-24 DIAGNOSIS — Z79899 Other long term (current) drug therapy: Secondary | ICD-10-CM | POA: Insufficient documentation

## 2020-05-24 DIAGNOSIS — O200 Threatened abortion: Secondary | ICD-10-CM | POA: Insufficient documentation

## 2020-05-24 DIAGNOSIS — R112 Nausea with vomiting, unspecified: Secondary | ICD-10-CM | POA: Diagnosis not present

## 2020-05-24 DIAGNOSIS — I499 Cardiac arrhythmia, unspecified: Secondary | ICD-10-CM | POA: Diagnosis not present

## 2020-05-24 DIAGNOSIS — Z3A00 Weeks of gestation of pregnancy not specified: Secondary | ICD-10-CM | POA: Diagnosis not present

## 2020-05-24 DIAGNOSIS — O209 Hemorrhage in early pregnancy, unspecified: Secondary | ICD-10-CM | POA: Diagnosis not present

## 2020-05-24 DIAGNOSIS — R52 Pain, unspecified: Secondary | ICD-10-CM

## 2020-05-24 LAB — CBC WITH DIFFERENTIAL/PLATELET
Abs Immature Granulocytes: 0.03 10*3/uL (ref 0.00–0.07)
Basophils Absolute: 0 10*3/uL (ref 0.0–0.1)
Basophils Relative: 0 %
Eosinophils Absolute: 0 10*3/uL (ref 0.0–0.5)
Eosinophils Relative: 0 %
HCT: 38.1 % (ref 36.0–46.0)
Hemoglobin: 12.3 g/dL (ref 12.0–15.0)
Immature Granulocytes: 0 %
Lymphocytes Relative: 11 %
Lymphs Abs: 1.1 10*3/uL (ref 0.7–4.0)
MCH: 29 pg (ref 26.0–34.0)
MCHC: 32.3 g/dL (ref 30.0–36.0)
MCV: 89.9 fL (ref 80.0–100.0)
Monocytes Absolute: 0.4 10*3/uL (ref 0.1–1.0)
Monocytes Relative: 4 %
Neutro Abs: 8.8 10*3/uL — ABNORMAL HIGH (ref 1.7–7.7)
Neutrophils Relative %: 85 %
Platelets: 288 10*3/uL (ref 150–400)
RBC: 4.24 MIL/uL (ref 3.87–5.11)
RDW: 12.7 % (ref 11.5–15.5)
WBC: 10.4 10*3/uL (ref 4.0–10.5)
nRBC: 0 % (ref 0.0–0.2)

## 2020-05-24 LAB — COMPREHENSIVE METABOLIC PANEL
ALT: 20 U/L (ref 0–44)
AST: 21 U/L (ref 15–41)
Albumin: 4.4 g/dL (ref 3.5–5.0)
Alkaline Phosphatase: 65 U/L (ref 38–126)
Anion gap: 13 (ref 5–15)
BUN: 8 mg/dL (ref 6–20)
CO2: 21 mmol/L — ABNORMAL LOW (ref 22–32)
Calcium: 9.3 mg/dL (ref 8.9–10.3)
Chloride: 101 mmol/L (ref 98–111)
Creatinine, Ser: 0.54 mg/dL (ref 0.44–1.00)
GFR calc Af Amer: >60 mL/min (ref >60)
GFR calc non Af Amer: >60 mL/min (ref >60)
Glucose, Bld: 75 mg/dL (ref 70–99)
Potassium: 3.3 mmol/L — ABNORMAL LOW (ref 3.5–5.1)
Sodium: 135 mmol/L (ref 135–145)
Total Bilirubin: 1.1 mg/dL (ref 0.3–1.2)
Total Protein: 7.2 g/dL (ref 6.5–8.1)

## 2020-05-24 LAB — CBC
HCT: 31.8 % — ABNORMAL LOW (ref 36.0–46.0)
Hemoglobin: 10.4 g/dL — ABNORMAL LOW (ref 12.0–15.0)
MCH: 29.5 pg (ref 26.0–34.0)
MCHC: 32.7 g/dL (ref 30.0–36.0)
MCV: 90.3 fL (ref 80.0–100.0)
Platelets: 238 10*3/uL (ref 150–400)
RBC: 3.52 MIL/uL — ABNORMAL LOW (ref 3.87–5.11)
RDW: 12.8 % (ref 11.5–15.5)
WBC: 11 10*3/uL — ABNORMAL HIGH (ref 4.0–10.5)
nRBC: 0 % (ref 0.0–0.2)

## 2020-05-24 LAB — HCG, QUANTITATIVE, PREGNANCY: hCG, Beta Chain, Quant, S: 179171 m[IU]/mL — ABNORMAL HIGH (ref <5)

## 2020-05-24 MED ORDER — LEVETIRACETAM IN NACL 500 MG/100ML IV SOLN
500.0000 mg | Freq: Once | INTRAVENOUS | Status: AC
  Administered 2020-05-24: 500 mg via INTRAVENOUS
  Filled 2020-05-24: qty 100

## 2020-05-24 MED ORDER — PROMETHAZINE HCL 25 MG RE SUPP: 25.0000 mg | Freq: Four times a day (QID) | RECTAL | 0 refills | Status: DC | PRN

## 2020-05-24 MED ORDER — PROMETHAZINE HCL 25 MG/ML IJ SOLN
25.0000 mg | Freq: Once | INTRAMUSCULAR | Status: AC
  Administered 2020-05-24: 25 mg via INTRAVENOUS
  Filled 2020-05-24: qty 1

## 2020-05-24 MED ORDER — SODIUM CHLORIDE 0.9 % IV BOLUS
1000.0000 mL | Freq: Once | INTRAVENOUS | Status: AC
  Administered 2020-05-24: 1000 mL via INTRAVENOUS

## 2020-05-24 MED ORDER — MISOPROSTOL 100 MCG PO TABS
800.0000 ug | ORAL_TABLET | Freq: Once | ORAL | Status: AC
  Administered 2020-05-24: 800 ug via BUCCAL
  Filled 2020-05-24: qty 8

## 2020-05-24 MED ORDER — FENTANYL CITRATE (PF) 100 MCG/2ML IJ SOLN
50.0000 ug | Freq: Once | INTRAMUSCULAR | Status: AC
  Administered 2020-05-24: 50 ug via INTRAVENOUS
  Filled 2020-05-24: qty 2

## 2020-05-24 NOTE — Discharge Instructions (Addendum)
Today you received medications that may make you sleepy or impair your ability to make decisions.  For the next 24 hours please do not drive, operate heavy machinery, care for a small child with out another adult present, or perform any activities that may cause harm to you or someone else if you were to fall asleep or be impaired.   You are being prescribed a medication which may make you sleepy. Please follow up of listed precautions for at least 24 hours after taking one dose.  I have given you a prescription today for Phenergan suppositories if you are unable to keep the pills down.  Do not take both of them at the same time, you may use the pills OR the suppositories.   Today it appears you had a miscarriage.  Please follow up with your OB/GYN.  If you need to return to the emergency room for this please go to the [facility] in [place] as this way you can be evaluated by the OB.

## 2020-05-24 NOTE — ED Notes (Signed)
Pt reports she believes she is having a miscarriage   She has had vag bleeding with cramping since this am   Followed by [facility]

## 2020-05-24 NOTE — ED Notes (Signed)
Awaiting disposition.

## 2020-05-24 NOTE — ED Triage Notes (Signed)
Patient comes to the ED with abdominal pain, vaginal bleeding beginning today.  Patient reports bright red blood with clots.  Patient is tearful in triage.  Patient unable to take her seizure medication, stating she "cannnot hold it down".  Last dose yesterday.

## 2020-05-24 NOTE — ED Notes (Signed)
Here for eval of vag bleed   abd pain and N

## 2020-05-24 NOTE — ED Provider Notes (Signed)
[facility] EMERGENCY DEPARTMENT Provider Note   CSN: [id number] Arrival date & time: 05/24/20  1442     History Chief Complaint  Patient presents with  . Abdominal Pain    Michelle Short is a 29 y.o. female G3P1011 6 weeks 3 days by LMP who presents today for evaluation of abdominal pain, nausea, vomiting, and vaginal bleeding.  She has struggled with nausea and vomiting this entire pregnancy however has not had abdominal pain or bleeding before.  She was recently seen by OB, according to chart review, and given a prescription for scopolamine to try which she has not yet tried due to concerns of it interacting with her Keppra.  She states that she has been unable to take her Keppra today.  She reports that she tried taking her home Phenergan prior to arrival however vomited it back up.  Reports vomiting since 5 AM today.  She began having vaginal bleeding at about 1 PM today.  She reports bright red blood with quarter sized clots.  She has gone through about 2 pads since this started.  Chart review showed that she was unsure if she wanted to keep the pregnancy or terminated, she reports that she has not done anything to attempt to terminate the pregnancy.  She does report that about 5 or 6 days ago she had some brown blood spotting however did not get checked for this.  She has not had any ultrasounds this pregnancy. She denies any sexual activity in the past few days.    HPI     Past Medical History:  Diagnosis Date  . Common migraine with intractable migraine 09/19/2018  . Headache   . Marijuana abuse   . Seizures (HCC) 12/2017    Patient Active Problem List   Diagnosis Date Noted  . Labial lesion 08/07/2019  . S/P cesarean section for FTP 06/03/2019  . Common migraine with intractable migraine 09/19/2018  . Seizure (HCC) 01/11/2018  . Marijuana abuse     Past Surgical History:  Procedure Laterality Date  . CESAREAN SECTION N/A 06/05/2019   Procedure: CESAREAN  SECTION;  Surgeon: Tereso Newcomer, MD;  Location: [facility] LD ORS;  Service: Obstetrics;  Laterality: N/A;     OB History    Gravida  3   Para  1   Term  1   Preterm      AB  1   Living  1     SAB  1   TAB      Ectopic      Multiple  0   Live Births  1           Family History  Problem Relation Age of Onset  . Hyperlipidemia Mother   . Diabetes Paternal Grandfather   . Cancer Maternal Grandmother        colon cancer  . Heart attack Maternal Grandfather     Social History   Tobacco Use  . Smoking status: Never Smoker  . Smokeless tobacco: Never Used  Vaping Use  . Vaping Use: Never used  Substance Use Topics  . Alcohol use: No  . Drug use: Not Currently    Types: Marijuana    Home Medications Prior to Admission medications   Medication Sig Start Date End Date Taking? Authorizing Provider  levETIRAcetam (KEPPRA) 500 MG tablet Take 500 mg by mouth 2 (two) times daily. 05/02/20  Yes [provider]  metoCLOPramide (REGLAN) 10 MG tablet Take 1 tablet (10 mg total)  by mouth every 8 (eight) hours as needed for nausea. 05/13/20  Yes Pricilla LovelessGoldston, Scott, MD  Prenatal Vit-Fe Fumarate-FA (PRENATAL COMPLETE) 14-0.4 MG TABS Take 1 tablet by mouth at bedtime. 05/08/20  Yes Zadie RhineWickline, Donald, MD  promethazine (PHENERGAN) 25 MG tablet Take 1 tablet (25 mg total) by mouth every 8 (eight) hours as needed for nausea or vomiting. 05/13/20  Yes Pricilla LovelessGoldston, Scott, MD  pyridOXINE (VITAMIN B-6) 25 MG tablet Take 1 tablet (25 mg total) by mouth 3 (three) times daily as needed. 05/08/20  Yes Zadie RhineWickline, Donald, MD  scopolamine (TRANSDERM-SCOP) 1 MG/3DAYS Place 1 patch (1.5 mg total) onto the skin every 3 (three) days. 05/15/20  Yes Cheral MarkerBooker, Kimberly R, CNM  promethazine (PHENERGAN) 25 MG suppository Place 1 suppository (25 mg total) rectally every 6 (six) hours as needed for nausea or vomiting. 05/24/20   Cristina GongHammond, Clayden Withem W, PA-C    Allergies    Ibuprofen  Review of Systems   Review  of Systems  Constitutional: Negative for chills and fever.  Respiratory: Negative for chest tightness and shortness of breath.   Cardiovascular: Negative for chest pain.  Gastrointestinal: Positive for abdominal pain, nausea and vomiting. Negative for constipation and diarrhea.  Genitourinary: Positive for pelvic pain and vaginal bleeding. Negative for vaginal discharge and vaginal pain.  Neurological: Negative for weakness and headaches.  All other systems reviewed and are negative.   Physical Exam Updated Vital Signs BP 106/61   Pulse 62   Temp 99.4 F (37.4 C) (Oral)   Resp 15   Ht 5\' 4"  (1.626 m)   Wt 49.4 kg   LMP 04/09/2020 (Within Days)   SpO2 100%   BMI 18.71 kg/m   Physical Exam Vitals and nursing note reviewed. Exam conducted with a chaperone present.  Constitutional:      Appearance: She is well-developed. She is ill-appearing. She is not diaphoretic.  HENT:     Head: Normocephalic and atraumatic.  Eyes:     General: No scleral icterus.       Right eye: No discharge.        Left eye: No discharge.     Conjunctiva/sclera: Conjunctivae normal.  Cardiovascular:     Rate and Rhythm: Normal rate and regular rhythm.     Heart sounds: Normal heart sounds.  Pulmonary:     Effort: Pulmonary effort is normal. No respiratory distress.     Breath sounds: Normal breath sounds. No stridor.  Abdominal:     General: Abdomen is flat. Bowel sounds are decreased. There is no distension.     Palpations: Abdomen is soft.     Tenderness: There is abdominal tenderness in the right lower quadrant, suprapubic area and left lower quadrant.     Hernia: No hernia is present.  Genitourinary:    Comments: Normal external female genitalia.  There is a large amount of blood and clots in the vaginal canal.  Swab stick was used to remove blood and clot however clot was not pulled.  There was what appeared to be POC, possibly placenta in the vaginal canal, this is sent to pathology.  Cervix is  about 2 cm dilated with a clot visualized in the cervical os.    Repeat pelvic exam while in the ER shows decreased blood in the vaginal canal, cervix is approximately 1 cm dilated with a clot visualized in the cervical os. Musculoskeletal:        General: No deformity.     Cervical back: Normal range of motion.  Skin:    General: Skin is warm and dry.  Neurological:     General: No focal deficit present.     Mental Status: She is alert.     Motor: No abnormal muscle tone.  Psychiatric:        Mood and Affect: Mood normal.        Behavior: Behavior normal.     ED Results / Procedures / Treatments   Labs (all labs ordered are listed, but only abnormal results are displayed) Labs Reviewed  CBC WITH DIFFERENTIAL/PLATELET - Abnormal; Notable for the following components:      Result Value   Neutro Abs 8.8 (*)    All other components within normal limits  HCG, QUANTITATIVE, PREGNANCY - Abnormal; Notable for the following components:   hCG, Beta Chain, Quant, S 179,171 (*)    All other components within normal limits  COMPREHENSIVE METABOLIC PANEL - Abnormal; Notable for the following components:   Potassium 3.3 (*)    CO2 21 (*)    All other components within normal limits  CBC - Abnormal; Notable for the following components:   WBC 11.0 (*)    RBC 3.52 (*)    Hemoglobin 10.4 (*)    HCT 31.8 (*)    All other components within normal limits  SURGICAL PATHOLOGY    EKG EKG Interpretation  Date/Time:  Sunday May 24 2020 16:00:32 EDT Ventricular Rate:  77 PR Interval:    QRS Duration: 92 QT Interval:  387 QTC Calculation: 438 R Axis:   67 Text Interpretation: Sinus arrhythmia RSR' in V1 or V2, probably normal variant No STMEI Confirmed by Alvester Chou [phone]) on 05/24/2020 4:28:24 PM   Radiology US OB LESS THAN 14 WEEKS WITH OB TRANSVAGINAL  Result Date: 05/24/2020 CLINICAL DATA:  Pain in bleeding for 6 hours. Last menstrual period 04/13/2020. Beta hCG of 179,171.  EXAM: OBSTETRIC <14 WK [place] AND TRANSVAGINAL OB US TECHNIQUE: Both transabdominal and transvaginal ultrasound examinations were performed for complete evaluation of the gestation as well as the maternal uterus, adnexal regions, and pelvic cul-de-sac. Transvaginal technique was performed to assess early pregnancy. COMPARISON:  Obstetric ultrasound dated 03/07/2019. FINDINGS: Intrauterine gestational sac: None Yolk sac:  Visualized. Embryo:  Visualized. Cardiac Activity: Visualized. Maternal uterus/adnexae: The uterus and adnexa appear normal. There is a small amount of free fluid in the cul-de-sac. IMPRESSION: No intrauterine gestational sac is identified. Recommend follow-up US in 10-14 days for definitive diagnosis. Electronically Signed   By: Romona Curls M.D.   On: 05/24/2020 19:46    Procedures Procedures (including critical care time)  Medications Ordered in ED Medications  levETIRAcetam (KEPPRA) IVPB 500 mg/100 mL premix (0 mg Intravenous Stopped 05/24/20 1728)  promethazine (PHENERGAN) injection 25 mg (25 mg Intravenous Given 05/24/20 1713)  sodium chloride 0.9 % bolus 1,000 mL (0 mLs Intravenous Stopped 05/24/20 1820)  fentaNYL (SUBLIMAZE) injection 50 mcg (50 mcg Intravenous Given 05/24/20 1820)  misoprostol (CYTOTEC) tablet 800 mcg (800 mcg Buccal Given 05/24/20 2254)    ED Course  I have reviewed the triage vital signs and the nursing notes.  Pertinent labs & imaging results that were available during my care of the patient were reviewed by me and considered in my medical decision making (see chart for details).  Clinical Course as of May 25 12  Wynelle Link May 24, 2020  2129 Repeat pelvic exam with nurse Earna Coder present.  Bleeding has slowed, however she still has a large clot in the cervical opening.    [  EH]  2222 I spoke with Dr. Adrian Blackwater of OB/GYN, he recommends 800 of cytotec buccally and discharge with out patient follow up.   [EH]    Clinical Course User Index [EH] Norman Clay   MDM Rules/Calculators/A&P                         Patient is a 29 year old woman who presents today for evaluation of vaginal bleeding.  She is reportedly 6 weeks 3 days by LMP.  On initial exam she appears uncomfortable and is dry heaving and vomiting with abdominal pain and cramping.  Review shows her blood type is O+, not a Rh immunoglobulin candidate.  hCG is elevated at 180,000.  Initial CBC shows hemoglobin of 12.3, repeat CBC after 4 hours shows hemoglobin is dropped to 10.4.  She had a elevation in white count from 10.4-11 therefore I do not suspect that this is purely dilutional.  CMP shows mild hypokalemia, from vomiting most likely, recommended increase p.o. intake of potassium.  She has a prescription for Phenergan at home however had been unable to tolerate it, she is given IV Phenergan.  She had been unable to keep down her home p.o. Keppra for her seizures and was given IV Keppra.  She is given IV fluids.  Her pain is treated with IV fentanyl and heating pads.    Ultrasound was obtained without evidence of intrauterine gestation or abnormal free fluid/visualized ectopic pregnancy.  I spoke with Dr. Gust Rung of OB/GYN.  Given that it appears patient is miscarrying and appears to be passing products of conception we will give a dose of Cytotec to attempt to facilitate passage of the clot that appears to be in her cervix..  I discussed this with the patient and she states her understanding.  Dr. Gust Rung is aware of patient's hemoglobin drop, given that her bleeding is slowed he recommends outpatient follow-up.    Patient is aware of Cytotec and its role in her treatment today.   Recommended close outpatient follow-up with return to [facility]U in [place] with additional ER care as needed for this condition.  I have given her a prescription for Phenergan suppositories, she is instructed that she may take the oral or the suppositories and not to take both of them at the same time or in  conjunction.    At discharge patient reported feeling better, was able to tolerate PO and reported her pain had improved.   Return precautions were discussed with patient who states their understanding.  At the time of discharge patient denied any unaddressed complaints or concerns.  Patient is agreeable for discharge home.  Note: Portions of this report may have been transcribed using voice recognition software. Every effort was made to ensure accuracy; however, inadvertent computerized transcription errors may be present,  Final Clinical Impression(s) / ED Diagnoses Final diagnoses:  Miscarriage  Non-intractable vomiting with nausea, unspecified vomiting type    Rx / DC Orders ED Discharge Orders         Ordered    promethazine (PHENERGAN) 25 MG suppository  Every 6 hours PRN     Discontinue  Reprint     05/24/20 2324           Cristina Gong, PA-C 05/25/20 0020    Terald Sleeper, MD 05/25/20 1339

## 2020-05-24 NOTE — ED Notes (Signed)
From US

## 2020-05-24 NOTE — ED Notes (Signed)
Ginger ale provided.

## 2020-05-27 LAB — SURGICAL PATHOLOGY

## 2020-06-02 ENCOUNTER — Other Ambulatory Visit: Payer: Medicaid Other

## 2020-06-02 ENCOUNTER — Ambulatory Visit: Payer: Medicaid Other

## 2020-06-02 ENCOUNTER — Other Ambulatory Visit: Payer: Self-pay

## 2020-06-02 DIAGNOSIS — O039 Complete or unspecified spontaneous abortion without complication: Secondary | ICD-10-CM

## 2020-06-08 ENCOUNTER — Encounter: Payer: Self-pay | Admitting: Obstetrics and Gynecology

## 2020-06-08 DIAGNOSIS — O039 Complete or unspecified spontaneous abortion without complication: Secondary | ICD-10-CM | POA: Insufficient documentation

## 2020-06-09 ENCOUNTER — Ambulatory Visit: Payer: Medicaid Other | Admitting: Adult Health

## 2020-07-23 ENCOUNTER — Encounter: Payer: Self-pay | Admitting: Neurology

## 2020-07-23 ENCOUNTER — Ambulatory Visit: Payer: Medicaid Other | Admitting: Neurology

## 2020-07-23 ENCOUNTER — Other Ambulatory Visit: Payer: Self-pay

## 2020-07-23 VITALS — BP 118/73 | HR 92 | Ht 64.0 in | Wt 105.6 lb

## 2020-07-23 DIAGNOSIS — R569 Unspecified convulsions: Secondary | ICD-10-CM

## 2020-07-23 MED ORDER — LEVETIRACETAM 500 MG PO TABS: 500.0000 mg | ORAL_TABLET | Freq: Two times a day (BID) | ORAL | 4 refills | Status: DC

## 2020-07-23 NOTE — Progress Notes (Signed)
I have read the note, and I agree with the clinical assessment and plan.  Tamla Winkels K Montravious Weigelt   

## 2020-07-23 NOTE — Patient Instructions (Signed)
Continue Keppra at current dosing Take it everyday, do not miss any doses Make sure still taking prenatal vitamin Call me if you have seizures See you back in 1 year

## 2020-07-23 NOTE — Progress Notes (Signed)
PATIENT: Michelle Short DOB: 06/28/1991  REASON FOR VISIT: follow up HISTORY FROM: patient  HISTORY OF PRESENT ILLNESS: Today 07/23/20 Michelle Short is a 29 year old female with history of migraine headaches and seizures.  Last nocturnal seizure was in March 2021, she had missed 2 doses of Keppra.  At last visit, was switched to Keppra extended release, but didn't fill the medication for some reason.  She remains on Keppra 500 mg twice a day, reports compliance.  No recurrent seizure.  Indicates she has been doing well, tolerating well.  No new problems or concerns.  She has a 80-year-old son.  Presents today for evaluation unaccompanied.  HISTORY 01/20/2020 SS: Michelle Short is a 29 year old female with history of migraine headaches and seizures.  She had recurrent nocturnal seizure last night 01/20/2020.  She woke up around 7 AM, had bitten her tongue, no urinary incontinence.  Yesterday, she missed both doses of her Keppra.  She has been having issues taking the medication twice daily.  She has a 77-month-old baby, is still breast-feeding.  She thinks it could have triggered by increased stress, sleep deprivation.  She works second shift.  Today, she feels weak, and tired as result of the seizure.  She lives with her mom and siblings.  She presents today for evaluation unaccompanied.   REVIEW OF SYSTEMS: Out of a complete 14 system review of symptoms, the patient complains only of the following symptoms, and all other reviewed systems are negative.  Seizure  ALLERGIES: Allergies  Allergen Reactions  . Ibuprofen Other (See Comments)    "interferes with my seizure medication"    HOME MEDICATIONS: Outpatient Medications Prior to Visit  Medication Sig Dispense Refill  . metoCLOPramide (REGLAN) 10 MG tablet Take 1 tablet (10 mg total) by mouth every 8 (eight) hours as needed for nausea. 20 tablet 0  . Prenatal Vit-Fe Fumarate-FA (PRENATAL COMPLETE) 14-0.4 MG TABS Take 1 tablet by mouth at  bedtime. 60 tablet 1  . promethazine (PHENERGAN) 25 MG suppository Place 1 suppository (25 mg total) rectally every 6 (six) hours as needed for nausea or vomiting. 12 each 0  . promethazine (PHENERGAN) 25 MG tablet Take 1 tablet (25 mg total) by mouth every 8 (eight) hours as needed for nausea or vomiting. 20 tablet 0  . pyridOXINE (VITAMIN B-6) 25 MG tablet Take 1 tablet (25 mg total) by mouth 3 (three) times daily as needed. 15 tablet 0  . scopolamine (TRANSDERM-SCOP) 1 MG/3DAYS Place 1 patch (1.5 mg total) onto the skin every 3 (three) days. 10 patch 3  . levETIRAcetam (KEPPRA) 500 MG tablet Take 500 mg by mouth 2 (two) times daily.     No facility-administered medications prior to visit.    PAST MEDICAL HISTORY: Past Medical History:  Diagnosis Date  . Common migraine with intractable migraine 09/19/2018  . Headache   . Marijuana abuse   . Seizures (HCC) 12/2017    PAST SURGICAL HISTORY: Past Surgical History:  Procedure Laterality Date  . CESAREAN SECTION N/A 06/05/2019   Procedure: CESAREAN SECTION;  Surgeon: Tereso Newcomer, MD;  Location: [facility] LD ORS;  Service: Obstetrics;  Laterality: N/A;    FAMILY HISTORY: Family History  Problem Relation Age of Onset  . Hyperlipidemia Mother   . Diabetes Paternal Grandfather   . Cancer Maternal Grandmother        colon cancer  . Heart attack Maternal Grandfather     SOCIAL HISTORY: Social History   Socioeconomic History  .  Marital status: Single    Spouse name: Irving Copas  . Number of children: 1  . Years of education: 73  . Highest education level: Some college, no degree  Occupational History  . Not on file  Tobacco Use  . Smoking status: Never Smoker  . Smokeless tobacco: Never Used  Vaping Use  . Vaping Use: Never used  Substance and Sexual Activity  . Alcohol use: No  . Drug use: Not Currently    Types: Marijuana  . Sexual activity: Not Currently    Birth control/protection: None  Other Topics Concern  . Not  on file  Social History Narrative  . Not on file   Social Determinants of Health   Financial Resource Strain:   . Difficulty of Paying Living Expenses: Not on file  Food Insecurity:   . Worried About Programme researcher, broadcasting/film/video in the Last Year: Not on file  . Ran Out of Food in the Last Year: Not on file  Transportation Needs:   . Lack of Transportation (Medical): Not on file  . Lack of Transportation (Non-Medical): Not on file  Physical Activity:   . Days of Exercise per Week: Not on file  . Minutes of Exercise per Session: Not on file  Stress:   . Feeling of Stress : Not on file  Social Connections:   . Frequency of Communication with Friends and Family: Not on file  . Frequency of Social Gatherings with Friends and Family: Not on file  . Attends Religious Services: Not on file  . Active Member of Clubs or Organizations: Not on file  . Attends Banker Meetings: Not on file  . Marital Status: Not on file  Intimate Partner Violence:   . Fear of Current or Ex-Partner: Not on file  . Emotionally Abused: Not on file  . Physically Abused: Not on file  . Sexually Abused: Not on file   PHYSICAL EXAM  Vitals:   07/23/20 1249  BP: 118/73  Pulse: 92  Weight: 105 lb 9.6 oz (47.9 kg)  Height: 5\' 4"  (1.626 m)   Body mass index is 18.13 kg/m.  Generalized: Well developed, in no acute distress   Neurological examination  Mentation: Alert oriented to time, place, history taking. Follows all commands speech and language fluent Cranial nerve II-XII: Pupils were equal round reactive to light. Extraocular movements were full, visual field were full on confrontational test. Facial sensation and strength were normal. Head turning and shoulder shrug  were normal and symmetric. Motor: The motor testing reveals 5 over 5 strength of all 4 extremities. Good symmetric motor tone is noted throughout.  Sensory: Sensory testing is intact to soft touch on all 4 extremities. No evidence of  extinction is noted.  Coordination: Cerebellar testing reveals good finger-nose-finger and heel-to-shin bilaterally.  Gait and station: Gait is normal.  Reflexes: Deep tendon reflexes are symmetric and normal bilaterally.   DIAGNOSTIC DATA (LABS, IMAGING, TESTING) - I reviewed patient records, labs, notes, testing and imaging myself where available.  Lab Results  Component Value Date   WBC 11.0 (H) 05/24/2020   HGB 10.4 (L) 05/24/2020   HCT 31.8 (L) 05/24/2020   MCV 90.3 05/24/2020   PLT 238 05/24/2020      Component Value Date/Time   NA 135 05/24/2020 1616   K 3.3 (L) 05/24/2020 1616   CL 101 05/24/2020 1616   CO2 21 (L) 05/24/2020 1616   GLUCOSE 75 05/24/2020 1616   BUN 8 05/24/2020 1616  CREATININE 0.54 05/24/2020 1616   CALCIUM 9.3 05/24/2020 1616   PROT 7.2 05/24/2020 1616   ALBUMIN 4.4 05/24/2020 1616   AST 21 05/24/2020 1616   ALT 20 05/24/2020 1616   ALKPHOS 65 05/24/2020 1616   BILITOT 1.1 05/24/2020 1616   GFRNONAA >60 05/24/2020 1616   GFRAA >60 05/24/2020 1616   No results found for: CHOL, HDL, LDLCALC, LDLDIRECT, TRIG, CHOLHDL No results found for: YYTK3T No results found for: VITAMINB12 No results found for: TSH  ASSESSMENT AND PLAN 29 y.o. year old female  has a past medical history of Common migraine with intractable migraine (09/19/2018), Headache, Marijuana abuse, and Seizures (HCC) (12/2017). here with:  1.  History of seizures  -No recurrent seizures since last seen -Continue Keppra 500 mg twice a day -Encouraged to continue prenatal vitamin, or multivitamin with folic acid -Call for recurrent seizure, follow-up in 1 year or sooner if needed  I spent 20 minutes of face-to-face and non-face-to-face time with patient.  This included previsit chart review, lab review, study review, order entry, electronic health record documentation, patient education.  Margie Ege, AGNP-C, DNP 07/23/2020, 1:10 PM [facility] [address]  [address] [place], [place] [address] [phone]

## 2020-08-11 ENCOUNTER — Ambulatory Visit (INDEPENDENT_AMBULATORY_CARE_PROVIDER_SITE_OTHER): Payer: Medicaid Other | Admitting: Adult Health

## 2020-08-11 ENCOUNTER — Encounter: Payer: Self-pay | Admitting: Adult Health

## 2020-08-11 VITALS — BP 114/78 | HR 89 | Ht 64.0 in | Wt 110.5 lb

## 2020-08-11 DIAGNOSIS — Z3202 Encounter for pregnancy test, result negative: Secondary | ICD-10-CM

## 2020-08-11 DIAGNOSIS — N760 Acute vaginitis: Secondary | ICD-10-CM | POA: Diagnosis not present

## 2020-08-11 DIAGNOSIS — N898 Other specified noninflammatory disorders of vagina: Secondary | ICD-10-CM

## 2020-08-11 DIAGNOSIS — Z30011 Encounter for initial prescription of contraceptive pills: Secondary | ICD-10-CM | POA: Diagnosis not present

## 2020-08-11 DIAGNOSIS — Z124 Encounter for screening for malignant neoplasm of cervix: Secondary | ICD-10-CM | POA: Insufficient documentation

## 2020-08-11 DIAGNOSIS — N750 Cyst of Bartholin's gland: Secondary | ICD-10-CM

## 2020-08-11 DIAGNOSIS — B9689 Other specified bacterial agents as the cause of diseases classified elsewhere: Secondary | ICD-10-CM | POA: Diagnosis not present

## 2020-08-11 DIAGNOSIS — Z30016 Encounter for initial prescription of transdermal patch hormonal contraceptive device: Secondary | ICD-10-CM | POA: Insufficient documentation

## 2020-08-11 LAB — POCT WET PREP (WET MOUNT)
Clue Cells Wet Prep Whiff POC: POSITIVE
WBC, Wet Prep HPF POC: POSITIVE

## 2020-08-11 LAB — POCT URINE PREGNANCY: Preg Test, Ur: NEGATIVE

## 2020-08-11 MED ORDER — METRONIDAZOLE 500 MG PO TABS
500.0000 mg | ORAL_TABLET | Freq: Two times a day (BID) | ORAL | 0 refills | Status: DC
Start: 2020-08-11 — End: 2021-02-04

## 2020-08-11 MED ORDER — NORETHINDRONE 0.35 MG PO TABS
1.0000 | ORAL_TABLET | Freq: Every day | ORAL | 11 refills | Status: DC
Start: 2020-08-11 — End: 2021-02-04

## 2020-08-11 MED ORDER — SULFAMETHOXAZOLE-TRIMETHOPRIM 800-160 MG PO TABS
1.0000 | ORAL_TABLET | Freq: Two times a day (BID) | ORAL | 0 refills | Status: DC
Start: 2020-08-11 — End: 2021-02-04

## 2020-08-11 NOTE — Progress Notes (Signed)
°  Subjective:     Patient ID: Michelle Short, female   DOB: August 07, 1991, 29 y.o.   MRN: [id number]  HPI Michelle Short is a 29 year old black female,single, G3P1021, in complaining of vaginal discharge and swollen in vagina ? Bartholin cyst and wants the patch ,but has migraine with aura, so will go with POP.  Review of Systems Vaginal discharge Swollen in vaginal area,?bartholin cyst  Reviewed past medical,surgical, social and family history. Reviewed medications and allergies.     Objective:   Physical Exam BP 114/78 (BP Location: Left Arm, Patient Position: Sitting, Cuff Size: Normal)    Pulse 89    Ht 5\' 4"  (1.626 m)    Wt 110 lb 8 oz (50.1 kg)    LMP 07/27/2020 (Approximate)    Breastfeeding Unknown    BMI 18.97 kg/m UPT is negative. Skin warm and dry.Pelvic: external genitalia is normal in appearance,but has swollen marbled sized knot in left labia at bartholin gland, vagina: white discharge with odor,urethra has no lesions or masses noted, cervix:smooth and bulbous, uterus: normal size, shape and contour, non tender, no masses felt, adnexa: no masses or tenderness noted. Bladder is non tender and no masses felt. Wet prep: + for clue cells and +WBCs. Examination chaperoned by 09/26/2020 LPN Fall risk is low. Examination chaperoned by Malachy Mood young LPN.   Upstream - 08/11/20 1202      Pregnancy Intention Screening   Does the patient want to become pregnant in the next year? No    Does the patient's partner want to become pregnant in the next year? No    Would the patient like to discuss contraceptive options today? Yes      Contraception Wrap Up   Current Method Female Condom    End Method Oral Contraceptive    Contraception Counseling Provided Yes              Assessment:     1. Pregnancy examination or test, negative result   2. Cyst of left Bartholin's gland Warm compress and will rx septra ds Meds ordered this encounter  Medications   norethindrone (MICRONOR) 0.35 MG  tablet    Sig: Take 1 tablet (0.35 mg total) by mouth daily.    Dispense:  28 tablet    Refill:  11    Order Specific Question:   Supervising Provider    Answer:   1203 H [[id number]]   sulfamethoxazole-trimethoprim (BACTRIM DS) 800-160 MG tablet    Sig: Take 1 tablet by mouth 2 (two) times daily. Take 1 bid    Dispense:  28 tablet    Refill:  0    Order Specific Question:   Supervising Provider    Answer:   Duane Lope H [[id number]]   metroNIDAZOLE (FLAGYL) 500 MG tablet    Sig: Take 1 tablet (500 mg total) by mouth 2 (two) times daily.    Dispense:  14 tablet    Refill:  0    Order Specific Question:   Supervising Provider    Answer:   Duane Lope, LUTHER H [[id number]]    3. BV (bacterial vaginosis) Will rx flagyl  4. Vaginal discharge   5. Encounter for initial prescription of contraceptive pills Will rx Micronor, start today and use condoms     Plan:     Follow up in 2 weeks

## 2020-08-24 ENCOUNTER — Telehealth: Payer: Self-pay | Admitting: Obstetrics & Gynecology

## 2020-08-24 MED ORDER — PREDNISONE 10 MG PO TABS
ORAL_TABLET | ORAL | 0 refills | Status: DC
Start: 2020-08-24 — End: 2021-02-04

## 2020-08-26 ENCOUNTER — Ambulatory Visit: Payer: Medicaid Other | Admitting: Adult Health

## 2020-09-07 ENCOUNTER — Emergency Department (HOSPITAL_COMMUNITY): Payer: Medicaid Other

## 2020-09-07 ENCOUNTER — Other Ambulatory Visit: Payer: Self-pay

## 2020-09-07 ENCOUNTER — Encounter: Payer: Self-pay | Admitting: Neurology

## 2020-09-07 ENCOUNTER — Emergency Department (HOSPITAL_COMMUNITY)
Admission: EM | Admit: 2020-09-07 | Discharge: 2020-09-07 | Disposition: A | Discharge status: Home / Self Care | Payer: Medicaid Other | Attending: Emergency Medicine | Admitting: Emergency Medicine

## 2020-09-07 ENCOUNTER — Encounter (HOSPITAL_COMMUNITY): Payer: Self-pay | Admitting: Emergency Medicine

## 2020-09-07 DIAGNOSIS — R1111 Vomiting without nausea: Secondary | ICD-10-CM | POA: Diagnosis not present

## 2020-09-07 DIAGNOSIS — R569 Unspecified convulsions: Secondary | ICD-10-CM | POA: Diagnosis not present

## 2020-09-07 DIAGNOSIS — R0602 Shortness of breath: Secondary | ICD-10-CM | POA: Diagnosis not present

## 2020-09-07 DIAGNOSIS — I959 Hypotension, unspecified: Secondary | ICD-10-CM | POA: Diagnosis not present

## 2020-09-07 DIAGNOSIS — G4489 Other headache syndrome: Secondary | ICD-10-CM | POA: Diagnosis not present

## 2020-09-07 DIAGNOSIS — Z20822 Contact with and (suspected) exposure to covid-19: Secondary | ICD-10-CM | POA: Insufficient documentation

## 2020-09-07 DIAGNOSIS — R519 Headache, unspecified: Secondary | ICD-10-CM | POA: Diagnosis not present

## 2020-09-07 DIAGNOSIS — R9431 Abnormal electrocardiogram [ECG] [EKG]: Secondary | ICD-10-CM | POA: Diagnosis not present

## 2020-09-07 LAB — COMPREHENSIVE METABOLIC PANEL
ALT: 21 U/L (ref 0–44)
AST: 26 U/L (ref 15–41)
Albumin: 4.3 g/dL (ref 3.5–5.0)
Alkaline Phosphatase: 64 U/L (ref 38–126)
Anion gap: 11 (ref 5–15)
BUN: 13 mg/dL (ref 6–20)
CO2: 24 mmol/L (ref 22–32)
Calcium: 9.1 mg/dL (ref 8.9–10.3)
Chloride: 102 mmol/L (ref 98–111)
Creatinine, Ser: 0.86 mg/dL (ref 0.44–1.00)
GFR, Estimated: >60 mL/min (ref >60)
Glucose, Bld: 156 mg/dL — ABNORMAL HIGH (ref 70–99)
Potassium: 3.5 mmol/L (ref 3.5–5.1)
Sodium: 137 mmol/L (ref 135–145)
Total Bilirubin: 1 mg/dL (ref 0.3–1.2)
Total Protein: 7.3 g/dL (ref 6.5–8.1)

## 2020-09-07 LAB — CBC
HCT: 36.9 % (ref 36.0–46.0)
Hemoglobin: 12.1 g/dL (ref 12.0–15.0)
MCH: 29 pg (ref 26.0–34.0)
MCHC: 32.8 g/dL (ref 30.0–36.0)
MCV: 88.5 fL (ref 80.0–100.0)
Platelets: 235 10*3/uL (ref 150–400)
RBC: 4.17 MIL/uL (ref 3.87–5.11)
RDW: 12.6 % (ref 11.5–15.5)
WBC: 13.7 10*3/uL — ABNORMAL HIGH (ref 4.0–10.5)
nRBC: 0 % (ref 0.0–0.2)

## 2020-09-07 LAB — CK: Total CK: 221 U/L (ref 38–234)

## 2020-09-07 LAB — RESPIRATORY PANEL BY RT PCR (FLU A&B, COVID)
Influenza A by PCR: NEGATIVE
Influenza B by PCR: NEGATIVE
SARS Coronavirus 2 by RT PCR: NEGATIVE

## 2020-09-07 LAB — VALPROIC ACID LEVEL: Valproic Acid Lvl: <10 ug/mL — ABNORMAL LOW (ref 50.0–100.0)

## 2020-09-07 LAB — HCG, QUANTITATIVE, PREGNANCY: hCG, Beta Chain, Quant, S: 1 m[IU]/mL (ref <5)

## 2020-09-07 MED ORDER — DIPHENHYDRAMINE HCL 50 MG/ML IJ SOLN
12.5000 mg | Freq: Once | INTRAMUSCULAR | Status: AC
  Administered 2020-09-07: 12.5 mg via INTRAVENOUS
  Filled 2020-09-07: qty 1

## 2020-09-07 MED ORDER — KETOROLAC TROMETHAMINE 30 MG/ML IJ SOLN
30.0000 mg | Freq: Once | INTRAMUSCULAR | Status: AC
  Administered 2020-09-07: 30 mg via INTRAVENOUS
  Filled 2020-09-07: qty 1

## 2020-09-07 MED ORDER — LACTATED RINGERS IV BOLUS
1000.0000 mL | Freq: Once | INTRAVENOUS | Status: AC
  Administered 2020-09-07: 1000 mL via INTRAVENOUS

## 2020-09-07 MED ORDER — METOCLOPRAMIDE HCL 5 MG/ML IJ SOLN
10.0000 mg | Freq: Once | INTRAMUSCULAR | Status: AC
  Administered 2020-09-07: 10 mg via INTRAVENOUS
  Filled 2020-09-07: qty 2

## 2020-09-07 NOTE — ED Notes (Signed)
Per MD request, RN attempted to get Pt to ambulate. At this time Pt refuses and reports being too tired to try to ambulate. MD Notified.

## 2020-09-07 NOTE — ED Triage Notes (Signed)
Pt with hx of seizures that had a witnessed seizure at 1930pm last night. Pt here now because of "severe body aches-from head to toes". Pt takes Depakote for seizures. Per EMS, last seizure 5 months ago.

## 2020-09-07 NOTE — ED Provider Notes (Signed)
[facility] EMERGENCY DEPARTMENT Provider Note   CSN: [id number] Arrival date & time: 09/07/20  0018     History Chief Complaint  Patient presents with  . Seizures   Level 5 caveat due to altered mental status Michelle Short is a 29 y.o. female.  The history is provided by the EMS personnel. The history is limited by the condition of the patient.  Seizures  Patient presents for reportedly having a seizure.  Patient had a seizure at home over 5 hours prior to arrival.  Patient has known history of seizures.  Apparently she regained consciousness after the seizure, but then began reporting headache and pain from head to toe.  On my initial evaluation patient was confused and unable to answer questions    Past Medical History:  Diagnosis Date  . Common migraine with intractable migraine 09/19/2018  . Headache   . Marijuana abuse   . Seizures (HCC) 12/2017    Patient Active Problem List   Diagnosis Date Noted  . Encounter for initial prescription of transdermal patch hormonal contraceptive device 08/11/2020  . Vaginal discharge 08/11/2020  . BV (bacterial vaginosis) 08/11/2020  . Pregnancy examination or test, negative result 08/11/2020  . Spontaneous miscarriage 06/08/2020  . Labial lesion 08/07/2019  . S/P cesarean section for FTP 06/03/2019  . Common migraine with intractable migraine 09/19/2018  . Seizure (HCC) 01/11/2018  . Marijuana abuse     Past Surgical History:  Procedure Laterality Date  . CESAREAN SECTION N/A 06/05/2019   Procedure: CESAREAN SECTION;  Surgeon: Tereso Newcomer, MD;  Location: [facility] LD ORS;  Service: Obstetrics;  Laterality: N/A;     OB History    Gravida  3   Para  1   Term  1   Preterm      AB  2   Living  1     SAB  2   TAB      Ectopic      Multiple  0   Live Births  1           Family History  Problem Relation Age of Onset  . Hyperlipidemia Mother   . Diabetes Paternal Grandfather   . Cancer Maternal  Grandmother        colon cancer  . Heart attack Maternal Grandfather     Social History   Tobacco Use  . Smoking status: Never Smoker  . Smokeless tobacco: Never Used  Vaping Use  . Vaping Use: Never used  Substance Use Topics  . Alcohol use: No  . Drug use: Not Currently    Types: Marijuana    Home Medications Prior to Admission medications   Medication Sig Start Date End Date Taking? Authorizing Provider  levETIRAcetam (KEPPRA) 500 MG tablet Take 1 tablet (500 mg total) by mouth 2 (two) times daily. 07/23/20   Glean Salvo, NP  metroNIDAZOLE (FLAGYL) 500 MG tablet Take 1 tablet (500 mg total) by mouth 2 (two) times daily. 08/11/20   Adline Potter, NP  norethindrone (MICRONOR) 0.35 MG tablet Take 1 tablet (0.35 mg total) by mouth daily. 08/11/20   Adline Potter, NP  predniSONE (DELTASONE) 10 MG tablet Take 4 tablets all at once daily for 10 days 08/24/20   Lazaro Arms, MD  pyridOXINE (VITAMIN B-6) 25 MG tablet Take 1 tablet (25 mg total) by mouth 3 (three) times daily as needed. 05/08/20   Zadie Rhine, MD  sulfamethoxazole-trimethoprim (BACTRIM DS) 800-160 MG tablet Take 1  tablet by mouth 2 (two) times daily. Take 1 bid 08/11/20   Adline Potter, NP    Allergies    Ibuprofen and Sulfa antibiotics  Review of Systems   Review of Systems  Unable to perform ROS: Mental status change  Neurological: Positive for seizures.    Physical Exam Updated Vital Signs BP 106/84   Pulse 61   Temp 97.8 F (36.6 C) (Rectal)   Resp 17   Ht 1.626 m (5\' 4" )   Wt 49.9 kg   LMP 08/24/2020   SpO2 96%   BMI 18.88 kg/m   Physical Exam CONSTITUTIONAL: Disheveled, no acute distress HEAD: Normocephalic/atraumatic EYES: EOMI/PERRL ENMT: Mucous membranes moist, no signs of trauma NECK: supple no meningeal signs CV: S1/S2 noted, no murmurs/rubs/gallops noted LUNGS: Lungs are clear to auscultation bilaterally, no apparent distress ABDOMEN: soft, nontender NEURO: Pt is  sleeping but arousable.  She moves all extremities x4.  However she appears to be confused does not answer questions appropriately EXTREMITIES: pulses normal/equal, full ROM,, no deformities SKIN: warm, color normal PSYCH: Unable to assess  ED Results / Procedures / Treatments   Labs (all labs ordered are listed, but only abnormal results are displayed) Labs Reviewed  COMPREHENSIVE METABOLIC PANEL - Abnormal; Notable for the following components:      Result Value   Glucose, Bld 156 (*)    All other components within normal limits  CBC - Abnormal; Notable for the following components:   WBC 13.7 (*)    All other components within normal limits  VALPROIC ACID LEVEL - Abnormal; Notable for the following components:   Valproic Acid Lvl <10 (*)    All other components within normal limits  RESPIRATORY PANEL BY RT PCR (FLU A&B, COVID)  CK  HCG, QUANTITATIVE, PREGNANCY    EKG EKG Interpretation  Date/Time:  Monday September 07 2020 00:31:21 EDT Ventricular Rate:  74 PR Interval:    QRS Duration: 93 QT Interval:  402 QTC Calculation: 446 R Axis:   62 Text Interpretation: Sinus rhythm RSR' in V1 or V2, probably normal variant Borderline T abnormalities, anterior leads Interpretation limited secondary to artifact Confirmed by [id number] (Zadie Rhine) on 09/07/2020 12:44:41 AM   Radiology CT Head Wo Contrast  Result Date: 09/07/2020 CLINICAL DATA:  Seizures tonight. Body aches. Abnormal neurological examination. EXAM: CT HEAD WITHOUT CONTRAST TECHNIQUE: Contiguous axial images were obtained from the base of the skull through the vertex without intravenous contrast. COMPARISON:  CT head and MRI brain 01/11/2018 FINDINGS: Brain: No evidence of acute infarction, hemorrhage, hydrocephalus, extra-axial collection or mass lesion/mass effect. Vascular: No hyperdense vessel or unexpected calcification. Skull: Normal. Negative for fracture or focal lesion. Sinuses/Orbits: Paranasal sinuses and  mastoid air cells are clear. Other: None. IMPRESSION: No acute intracranial abnormalities. Electronically Signed   By: 01/13/2018 M.D.   On: 09/07/2020 03:22   DG Chest Port 1 View  Result Date: 09/07/2020 CLINICAL DATA:  Shortness of breath.  Seizures. EXAM: PORTABLE CHEST 1 VIEW COMPARISON:  None. FINDINGS: Mild hyperinflation. Normal heart size and pulmonary vascularity. No focal airspace disease or consolidation in the lungs. No blunting of costophrenic angles. No pneumothorax. Mediastinal contours appear intact. Acromioclavicular joints are malaligned. This could be positional or may indicate ligamentous injury bilaterally. Correlation with physical examination recommended. IMPRESSION: No evidence of active pulmonary disease. Nonspecific malalignment of the acromioclavicular joints. Electronically Signed   By: 09/09/2020 M.D.   On: 09/07/2020 03:29    Procedures Procedures  Medications Ordered in ED Medications  lactated ringers bolus 1,000 mL (0 mLs Intravenous Stopped 09/07/20 0245)  metoCLOPramide (REGLAN) injection 10 mg (10 mg Intravenous Given 09/07/20 0355)  ketorolac (TORADOL) 30 MG/ML injection 30 mg (30 mg Intravenous Given 09/07/20 0355)  diphenhydrAMINE (BENADRYL) injection 12.5 mg (12.5 mg Intravenous Given 09/07/20 0353)    ED Course  I have reviewed the triage vital signs and the nursing notes.  Pertinent labs & imaging results that were available during my care of the patient were reviewed by me and considered in my medical decision making (see chart for details).    MDM Rules/Calculators/A&P                          3:42 AM Patient presented for seizures and headache.  She has a known history of seizures.  Nursing staff reports patient was awake and alert requesting pain medicine for her headaches.  However on my initial evaluation patient appeared confused and was not answering questions appropriately Extensive evaluation was initiated, but no acute  findings at this time.  She is afebrile, no meningeal signs, CT head negative 5:24 AM Headache was treated and patient felt improved.  Low suspicion for acute neurologic emergency 6:26 AM Patient improved and back to baseline.  She is ambulatory and taking p.o. fluids. Will discharge home. Advised patient no driving until seen by neurology. She is followed by a local neurologist. We discussed strict return precautions Final Clinical Impression(s) / ED Diagnoses Final diagnoses:  Seizure [facility])  Other headache syndrome    Rx / DC Orders ED Discharge Orders    None       Zadie Rhine, MD 09/07/20 [phone]

## 2020-09-07 NOTE — Discharge Instructions (Addendum)
Please be aware you may have another seizure  Do not drive until seen by your physician for your condition  Do not climb ladders/roofs/trees as a seizure can occur at that height and cause serious harm  Do not bathe/swim alone as a seizure can occur and cause serious harm  Please followup with your physician or neurologist for further testing and possible treatment     You are having a headache. No specific cause was found today for your headache. It may have been a migraine or other cause of headache. Stress, anxiety, fatigue, and depression are common triggers for headaches. Your headache today does not appear to be life-threatening or require hospitalization, but often the exact cause of headaches is not determined in the emergency department. Therefore, follow-up with your doctor is very important to find out what may have caused your headache, and whether or not you need any further diagnostic testing or treatment. Sometimes headaches can appear benign (not harmful), but then more serious symptoms can develop which should prompt an immediate re-evaluation by your doctor or the emergency department.  SEEK MEDICAL ATTENTION IF:  You develop possible problems with medications prescribed.  The medications don't resolve your headache, if it recurs , or if you have multiple episodes of vomiting or can't take fluids. You have a change from the usual headache.  RETURN IMMEDIATELY IF you develop a sudden, severe headache or confusion, become poorly responsive or faint, develop a fever above 100.97F or problem breathing, have a change in speech, vision, swallowing, or understanding, or develop new weakness, numbness, tingling, incoordination, or have a seizure.

## 2020-09-07 NOTE — ED Notes (Signed)
Pt woke up and got dressed. RN assisted Pt to get out of bed and ambulate. Pt reported feeling better and would be ready for discharge. MD Notified.

## 2020-09-08 ENCOUNTER — Telehealth: Payer: Self-pay

## 2020-09-08 NOTE — Telephone Encounter (Signed)
Transition Care Management Unsuccessful Follow-up Telephone Call  Date of discharge and from where:  [facility] 09/08/2020  Attempts:  1st Attempt  Reason for unsuccessful TCM follow-up call:  Unable to reach patient

## 2020-12-21 ENCOUNTER — Emergency Department (HOSPITAL_COMMUNITY)
Admission: EM | Admit: 2020-12-21 | Discharge: 2020-12-21 | Disposition: A | Discharge status: Home / Self Care | Payer: Medicaid Other | Attending: Emergency Medicine | Admitting: Emergency Medicine

## 2020-12-21 ENCOUNTER — Other Ambulatory Visit: Payer: Self-pay

## 2020-12-21 ENCOUNTER — Encounter (HOSPITAL_COMMUNITY): Payer: Self-pay

## 2020-12-21 ENCOUNTER — Telehealth: Payer: Self-pay | Admitting: Gastroenterology

## 2020-12-21 DIAGNOSIS — R569 Unspecified convulsions: Secondary | ICD-10-CM | POA: Insufficient documentation

## 2020-12-21 DIAGNOSIS — I959 Hypotension, unspecified: Secondary | ICD-10-CM | POA: Diagnosis not present

## 2020-12-21 DIAGNOSIS — R519 Headache, unspecified: Secondary | ICD-10-CM | POA: Insufficient documentation

## 2020-12-21 DIAGNOSIS — K2971 Gastritis, unspecified, with bleeding: Secondary | ICD-10-CM | POA: Diagnosis not present

## 2020-12-21 DIAGNOSIS — K2901 Acute gastritis with bleeding: Secondary | ICD-10-CM | POA: Diagnosis not present

## 2020-12-21 DIAGNOSIS — R0902 Hypoxemia: Secondary | ICD-10-CM | POA: Diagnosis not present

## 2020-12-21 DIAGNOSIS — G43909 Migraine, unspecified, not intractable, without status migrainosus: Secondary | ICD-10-CM | POA: Diagnosis not present

## 2020-12-21 LAB — CBC
HCT: 34.7 % — ABNORMAL LOW (ref 36.0–46.0)
Hemoglobin: 10.9 g/dL — ABNORMAL LOW (ref 12.0–15.0)
MCH: 29.3 pg (ref 26.0–34.0)
MCHC: 31.4 g/dL (ref 30.0–36.0)
MCV: 93.3 fL (ref 80.0–100.0)
Platelets: 191 10*3/uL (ref 150–400)
RBC: 3.72 MIL/uL — ABNORMAL LOW (ref 3.87–5.11)
RDW: 12.8 % (ref 11.5–15.5)
WBC: 14.1 10*3/uL — ABNORMAL HIGH (ref 4.0–10.5)
nRBC: 0 % (ref 0.0–0.2)

## 2020-12-21 LAB — BASIC METABOLIC PANEL
Anion gap: 10 (ref 5–15)
BUN: 9 mg/dL (ref 6–20)
CO2: 23 mmol/L (ref 22–32)
Calcium: 8.6 mg/dL — ABNORMAL LOW (ref 8.9–10.3)
Chloride: 104 mmol/L (ref 98–111)
Creatinine, Ser: 0.66 mg/dL (ref 0.44–1.00)
GFR, Estimated: >60 mL/min (ref >60)
Glucose, Bld: 107 mg/dL — ABNORMAL HIGH (ref 70–99)
Potassium: 3.6 mmol/L (ref 3.5–5.1)
Sodium: 137 mmol/L (ref 135–145)

## 2020-12-21 LAB — URINALYSIS, ROUTINE W REFLEX MICROSCOPIC
Bilirubin Urine: NEGATIVE
Glucose, UA: NEGATIVE mg/dL
Hgb urine dipstick: NEGATIVE
Ketones, ur: NEGATIVE mg/dL
Leukocytes,Ua: NEGATIVE
Nitrite: NEGATIVE
Protein, ur: NEGATIVE mg/dL
Specific Gravity, Urine: 1.016 (ref 1.005–1.030)
pH: 5 (ref 5.0–8.0)

## 2020-12-21 LAB — POC URINE PREG, ED: Preg Test, Ur: NEGATIVE

## 2020-12-21 LAB — HEPATIC FUNCTION PANEL
ALT: 27 U/L (ref 0–44)
AST: 30 U/L (ref 15–41)
Albumin: 4 g/dL (ref 3.5–5.0)
Alkaline Phosphatase: 59 U/L (ref 38–126)
Bilirubin, Direct: 0.2 mg/dL (ref 0.0–0.2)
Indirect Bilirubin: 0.6 mg/dL (ref 0.3–0.9)
Total Bilirubin: 0.8 mg/dL (ref 0.3–1.2)
Total Protein: 6.6 g/dL (ref 6.5–8.1)

## 2020-12-21 LAB — PROTIME-INR
INR: 1.1 (ref 0.8–1.2)
Prothrombin Time: 14 seconds (ref 11.4–15.2)

## 2020-12-21 LAB — ETHANOL: Alcohol, Ethyl (B): <10 mg/dL (ref <10)

## 2020-12-21 LAB — LIPASE, BLOOD: Lipase: 23 U/L (ref 11–51)

## 2020-12-21 MED ORDER — ONDANSETRON HCL 4 MG/2ML IJ SOLN
4.0000 mg | Freq: Once | INTRAMUSCULAR | Status: AC
  Administered 2020-12-21: 4 mg via INTRAVENOUS
  Filled 2020-12-21: qty 2

## 2020-12-21 MED ORDER — ONDANSETRON 4 MG PO TBDP: 4.0000 mg | ORAL_TABLET | Freq: Three times a day (TID) | ORAL | 0 refills | Status: DC | PRN

## 2020-12-21 MED ORDER — SODIUM CHLORIDE 0.9 % IV BOLUS
1000.0000 mL | Freq: Once | INTRAVENOUS | Status: AC
  Administered 2020-12-21: 1000 mL via INTRAVENOUS

## 2020-12-21 MED ORDER — LEVETIRACETAM IN NACL 1000 MG/100ML IV SOLN
1000.0000 mg | Freq: Once | INTRAVENOUS | Status: AC
  Administered 2020-12-21: 1000 mg via INTRAVENOUS
  Filled 2020-12-21: qty 100

## 2020-12-21 MED ORDER — ACETAMINOPHEN 325 MG PO TABS
650.0000 mg | ORAL_TABLET | Freq: Once | ORAL | Status: AC
  Administered 2020-12-21: 650 mg via ORAL
  Filled 2020-12-21: qty 2

## 2020-12-21 MED ORDER — PANTOPRAZOLE SODIUM 40 MG IV SOLR
40.0000 mg | Freq: Once | INTRAVENOUS | Status: AC
  Administered 2020-12-21: 40 mg via INTRAVENOUS
  Filled 2020-12-21: qty 40

## 2020-12-21 MED ORDER — FENTANYL CITRATE (PF) 100 MCG/2ML IJ SOLN
50.0000 ug | Freq: Once | INTRAMUSCULAR | Status: AC
  Administered 2020-12-21: 50 ug via INTRAVENOUS
  Filled 2020-12-21: qty 2

## 2020-12-21 MED ORDER — PANTOPRAZOLE SODIUM 40 MG PO TBEC: 40.0000 mg | DELAYED_RELEASE_TABLET | Freq: Every day | ORAL | 0 refills | Status: DC

## 2020-12-21 NOTE — ED Triage Notes (Signed)
Pt brought in due to one grand mal seizure at her home. While in the truck pt had 2 seizures lasting 2 10-15 seconds each. Pt reports nausea and HA. Pt was given zofran

## 2020-12-21 NOTE — ED Notes (Signed)
IV removed.

## 2020-12-21 NOTE — ED Notes (Signed)
Seizure pads placed on bedrails. Pt placed on cardiac monitor

## 2020-12-21 NOTE — Discharge Instructions (Addendum)
Take the Zofran as needed to prevent nausea and vomiting.  Protonix as directed.  Return for any recurrent vomiting of blood.  10 you take your antiseizure medicine as directed.  Follow-up with your neurologist as needed.  Follow-up with your primary care doctor as needed.

## 2020-12-21 NOTE — Telephone Encounter (Signed)
Michelle Short,   Please arrange new patient visit due to hematemesis. Thanks!

## 2020-12-21 NOTE — ED Provider Notes (Signed)
[facility] EMERGENCY DEPARTMENT Provider Note   CSN: [id number] Arrival date & time: 12/21/20  1313     History Chief Complaint  Patient presents with  . Seizures    Michelle Short is a 30 y.o. female.  HPI 30 year old female presents with seizure and vomiting.  The history is obtained from the patient.  The patient states that she woke up early in the morning with hematemesis and numerous episodes of vomiting.  She states it was almost all blood.  No diarrhea.  Some abdominal discomfort.  She did drink 1 drink of alcohol last night/this morning.  Normally does not drink very much.  No NSAIDs.  She had a seizure this morning after this as well as she can tell that she now has a headache which is typical after her seizures.  EMS reports seizures in the ambulance as well.  She was given no meds besides Zofran.  No recent illness before waking up this morning.  Past Medical History:  Diagnosis Date  . Common migraine with intractable migraine 09/19/2018  . Headache   . Marijuana abuse   . Seizures (HCC) 12/2017    Patient Active Problem List   Diagnosis Date Noted  . Encounter for initial prescription of transdermal patch hormonal contraceptive device 08/11/2020  . Vaginal discharge 08/11/2020  . BV (bacterial vaginosis) 08/11/2020  . Pregnancy examination or test, negative result 08/11/2020  . Spontaneous miscarriage 06/08/2020  . Labial lesion 08/07/2019  . S/P cesarean section for FTP 06/03/2019  . Common migraine with intractable migraine 09/19/2018  . Seizure (HCC) 01/11/2018  . Marijuana abuse     Past Surgical History:  Procedure Laterality Date  . CESAREAN SECTION N/A 06/05/2019   Procedure: CESAREAN SECTION;  Surgeon: Tereso Newcomer, MD;  Location: [facility] LD ORS;  Service: Obstetrics;  Laterality: N/A;     OB History    Gravida  3   Para  1   Term  1   Preterm      AB  2   Living  1     SAB  2   IAB      Ectopic      Multiple  0   Live  Births  1           Family History  Problem Relation Age of Onset  . Hyperlipidemia Mother   . Diabetes Paternal Grandfather   . Cancer Maternal Grandmother        colon cancer  . Heart attack Maternal Grandfather     Social History   Tobacco Use  . Smoking status: Never Smoker  . Smokeless tobacco: Never Used  Vaping Use  . Vaping Use: Never used  Substance Use Topics  . Alcohol use: No  . Drug use: Yes    Types: Marijuana    Home Medications Prior to Admission medications   Medication Sig Start Date End Date Taking? Authorizing Provider  ondansetron (ZOFRAN ODT) 4 MG disintegrating tablet Take 1 tablet (4 mg total) by mouth every 8 (eight) hours as needed for nausea or vomiting. 12/21/20  Yes Pricilla Loveless, MD  pantoprazole (PROTONIX) 40 MG tablet Take 1 tablet (40 mg total) by mouth daily. 12/21/20  Yes Pricilla Loveless, MD  levETIRAcetam (KEPPRA) 500 MG tablet Take 1 tablet (500 mg total) by mouth 2 (two) times daily. 07/23/20   Glean Salvo, NP  metroNIDAZOLE (FLAGYL) 500 MG tablet Take 1 tablet (500 mg total) by mouth 2 (two) times daily. 08/11/20  Cyril Mourning A, NP  norethindrone (MICRONOR) 0.35 MG tablet Take 1 tablet (0.35 mg total) by mouth daily. 08/11/20   Adline Potter, NP  predniSONE (DELTASONE) 10 MG tablet Take 4 tablets all at once daily for 10 days 08/24/20   Lazaro Arms, MD  pyridOXINE (VITAMIN B-6) 25 MG tablet Take 1 tablet (25 mg total) by mouth 3 (three) times daily as needed. 05/08/20   Zadie Rhine, MD  sulfamethoxazole-trimethoprim (BACTRIM DS) 800-160 MG tablet Take 1 tablet by mouth 2 (two) times daily. Take 1 bid 08/11/20   Adline Potter, NP    Allergies    Ibuprofen and Sulfa antibiotics  Review of Systems   Review of Systems  Constitutional: Negative for fever.  HENT:       No tongue injury  Gastrointestinal: Positive for abdominal pain and vomiting.  Neurological: Positive for seizures and headaches.  All other  systems reviewed and are negative.   Physical Exam Updated Vital Signs BP (!) 99/55   Pulse 79   Temp 98 F (36.7 C) (Oral)   Ht 5\' 4"  (1.626 m)   Wt 48.1 kg   SpO2 100%   BMI 18.19 kg/m   Physical Exam Vitals and nursing note reviewed.  Constitutional:      Appearance: She is well-developed and well-nourished.  HENT:     Head: Normocephalic and atraumatic.     Right Ear: External ear normal.     Left Ear: External ear normal.     Nose: Nose normal.     Mouth/Throat:     Pharynx: Oropharynx is clear. No posterior oropharyngeal erythema.  Eyes:     General:        Right eye: No discharge.        Left eye: No discharge.     Pupils: Pupils are equal, round, and reactive to light.  Cardiovascular:     Rate and Rhythm: Normal rate and regular rhythm.     Heart sounds: Normal heart sounds.  Pulmonary:     Effort: Pulmonary effort is normal.     Breath sounds: Normal breath sounds.  Abdominal:     General: There is no distension.     Palpations: Abdomen is soft.     Tenderness: There is no abdominal tenderness.  Musculoskeletal:     Cervical back: Normal range of motion and neck supple. No rigidity.  Skin:    General: Skin is warm and dry.  Neurological:     Mental Status: She is alert.     Comments: CN 3-12 grossly intact. 5/5 strength in all 4 extremities. Grossly normal sensation.  Psychiatric:        Mood and Affect: Mood is not anxious.     ED Results / Procedures / Treatments   Labs (all labs ordered are listed, but only abnormal results are displayed) Labs Reviewed  BASIC METABOLIC PANEL - Abnormal; Notable for the following components:      Result Value   Glucose, Bld 107 (*)    Calcium 8.6 (*)    All other components within normal limits  CBC - Abnormal; Notable for the following components:   WBC 14.1 (*)    RBC 3.72 (*)    Hemoglobin 10.9 (*)    HCT 34.7 (*)    All other components within normal limits  LIPASE, BLOOD  HEPATIC FUNCTION PANEL   PROTIME-INR  ETHANOL  URINALYSIS, ROUTINE W REFLEX MICROSCOPIC  POC URINE PREG, ED    EKG None  Radiology No results found.  Procedures Procedures   Medications Ordered in ED Medications  sodium chloride 0.9 % bolus 1,000 mL (0 mLs Intravenous Stopped 12/21/20 1431)  levETIRAcetam (KEPPRA) IVPB 1000 mg/100 mL premix (0 mg Intravenous Stopped 12/21/20 1431)  ondansetron (ZOFRAN) injection 4 mg (4 mg Intravenous Given 12/21/20 1353)  fentaNYL (SUBLIMAZE) injection 50 mcg (50 mcg Intravenous Given 12/21/20 1352)  acetaminophen (TYLENOL) tablet 650 mg (650 mg Oral Given 12/21/20 1354)  pantoprazole (PROTONIX) injection 40 mg (40 mg Intravenous Given 12/21/20 1445)    ED Course  I have reviewed the triage vital signs and the nursing notes.  Pertinent labs & imaging results that were available during my care of the patient were reviewed by me and considered in my medical decision making (see chart for details).    MDM Rules/Calculators/A&P                          I discussed with Dr. Marletta Lor of GI, and at this point he recommends Protonix on discharge but does not think she needs admission for EGD.  She has a very slight anemia compared to baseline.  Leukocytosis likely reactive to numerous vomiting episodes of the seizure.  She is not altered on my exam.  Otherwise, urinalysis is still pending.  She has been given supportive care here as well as IV Keppra.  Along the vomiting stops and she does better at discharge. Care to Dr. Deretha Emory. Final Clinical Impression(s) / ED Diagnoses Final diagnoses:  Seizure (HCC)  Acute gastritis with hemorrhage, unspecified gastritis type    Rx / DC Orders ED Discharge Orders         Ordered    pantoprazole (PROTONIX) 40 MG tablet  Daily        12/21/20 1508    ondansetron (ZOFRAN ODT) 4 MG disintegrating tablet  Every 8 hours PRN        12/21/20 1508           Pricilla Loveless, MD 12/21/20 1517

## 2020-12-22 ENCOUNTER — Encounter: Payer: Self-pay | Admitting: Neurology

## 2020-12-22 ENCOUNTER — Encounter: Payer: Self-pay | Admitting: Internal Medicine

## 2020-12-23 ENCOUNTER — Telehealth: Payer: Self-pay | Admitting: Neurology

## 2020-12-23 MED ORDER — LEVETIRACETAM 1000 MG PO TABS: 1000.0000 mg | ORAL_TABLET | Freq: Two times a day (BID) | ORAL | 5 refills | Status: DC

## 2020-12-23 NOTE — Addendum Note (Signed)
Addended by: Glean Salvo on: 12/23/2020 04:24 PM   Modules accepted: Orders

## 2020-12-23 NOTE — Telephone Encounter (Signed)
Would recommend Keppra 1000 mg twice daily since break through seizure on 750 mg twice daily.

## 2020-12-23 NOTE — Telephone Encounter (Signed)
Called pt and she states that she was taking her keppra 70mg  po bid (compliant).  Trigger?? Snot sleeping well.  She is on protonix/odansetron for stomach (gastritis). No appt placed on list for cancellation.  She did not feel comfortable taking higher dose of keppra.

## 2020-12-23 NOTE — Telephone Encounter (Signed)
Spoke to pt and relayed that SS/NP rec would be to take the keppra 1000mg  po bid.  Pt stated ok.  New prescription to be placed. (500mg   2 tabs po bid).

## 2020-12-23 NOTE — Telephone Encounter (Signed)
Chart reviewed, was last seen in the office July 23, 2020.  At that time her last seizure was in March 2021 after she missed two doses of Keppra.  She was taking Keppra 500 mg twice a day in September, had no seizure.  Was in the ER September 07, 2020 for reported seizure.  CT head was negative.  She sent a MyChart message, Keppra was increased to 750 mg twice a day.  Was recently in the ER December 21, 2020, reportedly woke up with hematic emesis, numerous episodes of vomiting, had a seizure afterwards.  She is scheduled to see GI in March.  She needs to increase Keppra, just make sure, she was compliant with Keppra 750 mg twice a day.  We will increase to 1000 mg twice a day.  I will wait to send the prescription until after you talk with her, see if there is any new information, and if she is agreeable to increasing the dosage. No driving until seizure free 6 months. Move up revisit for next few weeks.

## 2020-12-29 NOTE — Telephone Encounter (Signed)
Called pt and made appt for her on thursday at 1545.  She accepted.

## 2020-12-31 ENCOUNTER — Ambulatory Visit: Payer: Medicaid Other | Admitting: Neurology

## 2020-12-31 ENCOUNTER — Encounter: Payer: Self-pay | Admitting: Neurology

## 2020-12-31 VITALS — BP 109/62 | HR 87 | Ht 64.0 in | Wt 113.0 lb

## 2020-12-31 DIAGNOSIS — R569 Unspecified convulsions: Secondary | ICD-10-CM

## 2020-12-31 MED ORDER — LEVETIRACETAM 1000 MG PO TABS: 1000.0000 mg | ORAL_TABLET | Freq: Two times a day (BID) | ORAL | 3 refills | Status: DC

## 2020-12-31 NOTE — Patient Instructions (Signed)
Continue with higher dose Keppra 1000 mg twice daily No driving until seizure free for 6 months  See you back in 6 months, call for seizures

## 2020-12-31 NOTE — Progress Notes (Signed)
PATIENT: Michelle Short DOB: 1991/03/22  REASON FOR VISIT: follow up HISTORY FROM: patient  HISTORY OF PRESENT ILLNESS: Today 12/31/20 Michelle Short is a 30 year old female with history of migraine headache and seizures. Was in the ER September 07, 2020 for reported seizure.  CT head was negative.  She sent a MyChart message, Keppra was increased to 750 mg twice a day. Was recently in the ER December 21, 2020, reportedly woke up, felt drowsy like she had seizure, went downstairs to get her sister. Then had another seizure, generalized seizure, no oral injury or incontinence. 2 seizures in the ambulance.Reports vomiting with blood, after seizures. For 2 days after she was coughing up blood. She didn't miss any doses of medication. Under more stress, just moved in with grandmother, single parent to 70 month old. Currently on Keppra 1000 mg twice daily. Seeing GI in March, vomiting has gone away. Taking Protonix daily. Mild occasional burning.  No more seizures.  Lipase, LFT, PT/INR, ETOH, unremarkable. WBC was 14.1, HGB 10.9.  HISTORY 07/23/2020 SS: Michelle Short is a 30 year old female with history of migraine headaches and seizures.  Last nocturnal seizure was in March 2021, she had missed 2 doses of Keppra.  At last visit, was switched to Keppra extended release, but didn't fill the medication for some reason.  She remains on Keppra 500 mg twice a day, reports compliance.  No recurrent seizure.  Indicates she has been doing well, tolerating well.  No new problems or concerns.  She has a 20-year-old son. Presents today for evaluation unaccompanied.   REVIEW OF SYSTEMS: Out of a complete 14 system review of symptoms, the patient complains only of the following symptoms, and all other reviewed systems are negative.  Seizures  ALLERGIES: Allergies  Allergen Reactions  . Ibuprofen Other (See Comments)    "interferes with my seizure medication"  . Sulfa Antibiotics Rash    HOME  MEDICATIONS: Outpatient Medications Prior to Visit  Medication Sig Dispense Refill  . metroNIDAZOLE (FLAGYL) 500 MG tablet Take 1 tablet (500 mg total) by mouth 2 (two) times daily. 14 tablet 0  . norethindrone (MICRONOR) 0.35 MG tablet Take 1 tablet (0.35 mg total) by mouth daily. 28 tablet 11  . ondansetron (ZOFRAN ODT) 4 MG disintegrating tablet Take 1 tablet (4 mg total) by mouth every 8 (eight) hours as needed for nausea or vomiting. 10 tablet 0  . pantoprazole (PROTONIX) 40 MG tablet Take 1 tablet (40 mg total) by mouth daily. 30 tablet 0  . predniSONE (DELTASONE) 10 MG tablet Take 4 tablets all at once daily for 10 days 40 tablet 0  . pyridOXINE (VITAMIN B-6) 25 MG tablet Take 1 tablet (25 mg total) by mouth 3 (three) times daily as needed. 15 tablet 0  . sulfamethoxazole-trimethoprim (BACTRIM DS) 800-160 MG tablet Take 1 tablet by mouth 2 (two) times daily. Take 1 bid 28 tablet 0  . levETIRAcetam (KEPPRA) 1000 MG tablet Take 1 tablet (1,000 mg total) by mouth 2 (two) times daily. 60 tablet 5   No facility-administered medications prior to visit.    PAST MEDICAL HISTORY: Past Medical History:  Diagnosis Date  . Common migraine with intractable migraine 09/19/2018  . Headache   . Marijuana abuse   . Seizures (HCC) 12/2017    PAST SURGICAL HISTORY: Past Surgical History:  Procedure Laterality Date  . CESAREAN SECTION N/A 06/05/2019   Procedure: CESAREAN SECTION;  Surgeon: Tereso Newcomer, MD;  Location: [facility];  Service:  Obstetrics;  Laterality: N/A;    FAMILY HISTORY: Family History  Problem Relation Age of Onset  . Hyperlipidemia Mother   . Diabetes Paternal Grandfather   . Cancer Maternal Grandmother        colon cancer  . Heart attack Maternal Grandfather     SOCIAL HISTORY: Social History   Socioeconomic History  . Marital status: Single    Spouse name: Irving Copas  . Number of children: 1  . Years of education: 17  . Highest education level: Some  college, no degree  Occupational History  . Not on file  Tobacco Use  . Smoking status: Never Smoker  . Smokeless tobacco: Never Used  Vaping Use  . Vaping Use: Never used  Substance and Sexual Activity  . Alcohol use: No  . Drug use: Yes    Types: Marijuana  . Sexual activity: Yes    Birth control/protection: None, Condom, Patch  Other Topics Concern  . Not on file  Social History Narrative  . Not on file   Social Determinants of Health   Financial Resource Strain: Not on file  Food Insecurity: Not on file  Transportation Needs: Not on file  Physical Activity: Not on file  Stress: Not on file  Social Connections: Not on file  Intimate Partner Violence: Not on file   PHYSICAL EXAM  Vitals:   12/31/20 1508  BP: 109/62  Pulse: 87  Weight: 113 lb (51.3 kg)  Height: 5\' 4"  (1.626 m)   Body mass index is 19.4 kg/m.  Generalized: Well developed, in no acute distress   Neurological examination  Mentation: Alert oriented to time, place, history taking. Follows all commands speech and language fluent Cranial nerve II-XII: Pupils were equal round reactive to light. Extraocular movements were full, visual field were full on confrontational test. Facial sensation and strength were normal.  Head turning and shoulder shrug  were normal and symmetric. Motor: The motor testing reveals 5 over 5 strength of all 4 extremities. Good symmetric motor tone is noted throughout.  Sensory: Sensory testing is intact to soft touch on all 4 extremities. No evidence of extinction is noted.  Coordination: Cerebellar testing reveals good finger-nose-finger and heel-to-shin bilaterally.  Gait and station: Gait is normal. Tandem gait is normal. Romberg is negative. No drift is seen.  Reflexes: Deep tendon reflexes are symmetric and normal bilaterally.   DIAGNOSTIC DATA (LABS, IMAGING, TESTING) - I reviewed patient records, labs, notes, testing and imaging myself where available.  Lab Results   Component Value Date   WBC 14.1 (H) 12/21/2020   HGB 10.9 (L) 12/21/2020   HCT 34.7 (L) 12/21/2020   MCV 93.3 12/21/2020   PLT 191 12/21/2020      Component Value Date/Time   NA 137 12/21/2020 1337   K 3.6 12/21/2020 1337   CL 104 12/21/2020 1337   CO2 23 12/21/2020 1337   GLUCOSE 107 (H) 12/21/2020 1337   BUN 9 12/21/2020 1337   CREATININE 0.66 12/21/2020 1337   CALCIUM 8.6 (L) 12/21/2020 1337   PROT 6.6 12/21/2020 1325   ALBUMIN 4.0 12/21/2020 1325   AST 30 12/21/2020 1325   ALT 27 12/21/2020 1325   ALKPHOS 59 12/21/2020 1325   BILITOT 0.8 12/21/2020 1325   GFRNONAA >60 12/21/2020 1337   GFRAA >60 05/24/2020 1616   No results found for: CHOL, HDL, LDLCALC, LDLDIRECT, TRIG, CHOLHDL No results found for: 07/25/2020 No results found for: VITAMINB12 No results found for: TSH  ASSESSMENT AND PLAN 29  y.o. year old female  has a past medical history of Common migraine with intractable migraine (09/19/2018), Headache, Marijuana abuse, and Seizures (HCC) (12/2017). here with:  1.  Seizures, recent occurrence -Continue with increased dose Keppra 1000 mg twice a day -No driving until seizure-free for 6 months -Seeing GI in a few weeks following reported hematemesis -Call for seizure activity, otherwise follow-up 6 months or sooner if needed  I spent 30 minutes of face-to-face and non-face-to-face time with patient.  This included previsit chart review, lab review, study review, order entry, electronic health record documentation, patient education.  Margie Ege, AGNP-C, DNP 12/31/2020, 4:37 PM [facility] [address], [address] [place], [place] [address] [phone]

## 2021-01-01 NOTE — Progress Notes (Signed)
I have read the note, and I agree with the clinical assessment and plan.  Michelle Short Michelle Short   

## 2021-01-09 ENCOUNTER — Encounter: Payer: Self-pay | Admitting: Neurology

## 2021-02-04 ENCOUNTER — Encounter: Payer: Self-pay | Admitting: Gastroenterology

## 2021-02-04 ENCOUNTER — Other Ambulatory Visit: Payer: Self-pay

## 2021-02-04 ENCOUNTER — Ambulatory Visit: Payer: Medicaid Other | Admitting: Gastroenterology

## 2021-02-04 DIAGNOSIS — K920 Hematemesis: Secondary | ICD-10-CM | POA: Diagnosis not present

## 2021-02-04 NOTE — H&P (View-Only) (Signed)
  Primary Care Physician:  Ferguson, John V, MD (Inactive)  Referring Physician: [place] [facility] Primary Gastroenterologist:  Dr. Carver  Chief Complaint  Patient presents with  . Abdominal Pain    Needle like feeling stomach, comes/goes  . Hematemesis    End in January but none since. Was in hospital    HPI:   Michelle Short is a 29 y.o. female presenting today at the request of [place] [facility] due to hematemesis in Jan 2022 following a seizure. Hgb 10.9 in ED Jan 2022, down from 12.1 in Oct 2021.   Had hematemesis after a seizure at home. Has had seizures for several years. Seizures controlled. Had more seizures while pregnant. If not enough sleep, will have a seizure. Has a neurologist in [place]. Doesn't normally vomit after a seizure. Started vomiting "straight blood" that was bright red.  Had 2-3 episodes of vomiting. No further hematemesis since that time. No melena. Has a needle like pain in epigastric area and LLQ at times since Jan 2022 about every other day. Not associated with anything. No NSAIDs. No aspirin powders. No GERD symptoms. Started on Protonix in Jan 2022 but not taking routinely.   About a week after the ED visit, was constipated for 5-6 days. Ate ice cream and had a BM. Has problems now with getting stool out occasionally. Drinks lots of water.   Past Medical History:  Diagnosis Date  . Common migraine with intractable migraine 09/19/2018  . Headache   . Marijuana abuse   . Seizures (HCC) 12/2017    Past Surgical History:  Procedure Laterality Date  . CESAREAN SECTION N/A 06/05/2019   Procedure: CESAREAN SECTION;  Surgeon: Anyanwu, Ugonna A, MD;  Location: [facility] LD [facility]S;  Service: Obstetrics;  Laterality: N/A;    Current Outpatient Medications  Medication Sig Dispense Refill  . levETIRAcetam (KEPPRA) 1000 MG tablet Take 1 tablet (1,000 mg total) by mouth 2 (two) times daily. 180 tablet 3  . pantoprazole (PROTONIX) 40 MG tablet Take 1 tablet (40 mg total)  by mouth daily. (Patient taking differently: Take 40 mg by mouth daily. As needed) 30 tablet 0  . ondansetron (ZOFRAN ODT) 4 MG disintegrating tablet Take 1 tablet (4 mg total) by mouth every 8 (eight) hours as needed for nausea or vomiting. (Patient not taking: Reported on 02/04/2021) 10 tablet 0  . sulfamethoxazole-trimethoprim (BACTRIM DS) 800-160 MG tablet Take 1 tablet by mouth 2 (two) times daily. Take 1 bid (Patient not taking: Reported on 02/04/2021) 28 tablet 0   No current facility-administered medications for this visit.    Allergies as of 02/04/2021 - Review Complete 02/04/2021  Allergen Reaction Noted  . Ibuprofen Other (See Comments) 08/29/2018  . Sulfa antibiotics Rash 08/24/2020    Family History  Problem Relation Age of Onset  . Hyperlipidemia Mother   . Diabetes Paternal Grandfather   . Cancer Maternal Grandmother        colon cancer  . Heart attack Maternal Grandfather     Social History   Socioeconomic History  . Marital status: Single    Spouse name: Alex Brown  . Number of children: 1  . Years of education: 15  . Highest education level: Some college, no degree  Occupational History  . Not on file  Tobacco Use  . Smoking status: Never Smoker  . Smokeless tobacco: Never Used  Vaping Use  . Vaping Use: Never used  Substance and Sexual Activity  . Alcohol use: No  .   Drug use: Yes    Types: Marijuana    Comment: daily  . Sexual activity: Yes    Birth control/protection: None, Condom, Patch  Other Topics Concern  . Not on file  Social History Narrative  . Not on file   Social Determinants of Health   Financial Resource Strain: Not on file  Food Insecurity: Not on file  Transportation Needs: Not on file  Physical Activity: Not on file  Stress: Not on file  Social Connections: Not on file  Intimate Partner Violence: Not on file    Review of Systems: Gen: Denies any fever, chills, fatigue, weight loss, lack of appetite.  CV: Denies chest pain,  heart palpitations, peripheral edema, syncope.  Resp: Denies shortness of breath at rest or with exertion. Denies wheezing or cough.  GI: see HPI GU : Denies urinary burning, urinary frequency, urinary hesitancy MS: Denies joint pain, muscle weakness, cramps, or limitation of movement.  Derm: Denies rash, itching, dry skin Psych: Denies depression, anxiety, memory loss, and confusion Heme: Denies bruising, bleeding, and enlarged lymph nodes.  Physical Exam: BP 112/61   Pulse 77   Temp (!) 97.3 F (36.3 C)   Ht 5\' 4"  (1.626 m)   Wt 114 lb (51.7 kg)   LMP 01/13/2021   BMI 19.57 kg/m  General:   Alert and oriented. Pleasant and cooperative. Well-nourished and well-developed.  Head:  Normocephalic and atraumatic. Eyes:  Without icterus, sclera clear and conjunctiva pink.  Ears:  Normal auditory acuity. Mouth:  Mask in place Lungs:  Clear to auscultation bilaterally. No wheezes, rales, or rhonchi. No distress.  Heart:  S1, S2 present without murmurs appreciated.  Abdomen:  +BS, soft, non-tender and non-distended. No HSM noted. No guarding or rebound. No masses appreciated.  Rectal:  Deferred  Msk:  Symmetrical without gross deformities. Normal posture. Extremities:  Without edema. Neurologic:  Alert and  oriented x4;  grossly normal neurologically. Skin:  Intact without significant lesions or rashes. Psych:  Alert and cooperative. Normal mood and affect.  ASSESSMENT: Michelle Short is a 30 y.o. female presenting today with history of hematemesis following a seizure at home in January 2022 and was seen in the ED for evaluation. Hgb at that time 10.9, previously 12.1 in Oct 2021.   She has had no further episodes of hematemesis but does report vague epigastric discomfort that has no precipitating or relieving factors. PPI was prescribed by ED but not taking routinely. No persistent GERD symptoms, no NSAIDs, and no melena.   Query hematemesis due to MW tear, esophagitis, gastritis,  possible underlying PUD. Will pursue diagnostic EGD in near future.   Constipation: mild. Start Benefiber daily.    PLAN: Proceed with upper endoscopy by Dr. Nov 2021 in near future: the risks, benefits, and alternatives have been discussed with the patient in detail. The patient states understanding and desires to proceed.   Take Protonix daily for now  Benefiber daily  Check CBC today  Return in 3-4 months   Marletta Lor, PhD, [facility]

## 2021-02-04 NOTE — Progress Notes (Signed)
Primary Care Physician:  Michelle Burrow, MD (Inactive)  Referring Physician: [facility] Primary Gastroenterologist:  Dr. Marletta Short  Chief Complaint  Patient presents with  . Abdominal Pain    Needle like feeling stomach, comes/goes  . Hematemesis    End in January but none since. Was in hospital    HPI:   Michelle Short is a 30 y.o. female presenting today at the request of [facility] due to hematemesis in Jan 2022 following a seizure. Hgb 10.9 in ED Jan 2022, down from 12.1 in Oct 2021.   Had hematemesis after a seizure at home. Has had seizures for several years. Seizures controlled. Had more seizures while pregnant. If not enough sleep, will have a seizure. Has a neurologist in [place]. Doesn't normally vomit after a seizure. Started vomiting "straight blood" that was bright red.  Had 2-3 episodes of vomiting. No further hematemesis since that time. No melena. Has a needle like pain in epigastric area and LLQ at times since Jan 2022 about every other day. Not associated with anything. No NSAIDs. No aspirin powders. No GERD symptoms. Started on Protonix in Jan 2022 but not taking routinely.   About a week after the ED visit, was constipated for 5-6 days. Ate ice cream and had a BM. Has problems now with getting stool out occasionally. Drinks lots of water.   Past Medical History:  Diagnosis Date  . Common migraine with intractable migraine 09/19/2018  . Headache   . Marijuana abuse   . Seizures (HCC) 12/2017    Past Surgical History:  Procedure Laterality Date  . CESAREAN SECTION N/A 06/05/2019   Procedure: CESAREAN SECTION;  Surgeon: Michelle Newcomer, MD;  Location: [facility] LD ORS;  Service: Obstetrics;  Laterality: N/A;    Current Outpatient Medications  Medication Sig Dispense Refill  . levETIRAcetam (KEPPRA) 1000 MG tablet Take 1 tablet (1,000 mg total) by mouth 2 (two) times daily. 180 tablet 3  . pantoprazole (PROTONIX) 40 MG tablet Take 1 tablet (40 mg total)  by mouth daily. (Patient taking differently: Take 40 mg by mouth daily. As needed) 30 tablet 0  . ondansetron (ZOFRAN ODT) 4 MG disintegrating tablet Take 1 tablet (4 mg total) by mouth every 8 (eight) hours as needed for nausea or vomiting. (Patient not taking: Reported on 02/04/2021) 10 tablet 0  . sulfamethoxazole-trimethoprim (BACTRIM DS) 800-160 MG tablet Take 1 tablet by mouth 2 (two) times daily. Take 1 bid (Patient not taking: Reported on 02/04/2021) 28 tablet 0   No current facility-administered medications for this visit.    Allergies as of 02/04/2021 - Review Complete 02/04/2021  Allergen Reaction Noted  . Ibuprofen Other (See Comments) 08/29/2018  . Sulfa antibiotics Rash 08/24/2020    Family History  Problem Relation Age of Onset  . Hyperlipidemia Mother   . Diabetes Paternal Grandfather   . Cancer Maternal Grandmother        colon cancer  . Heart attack Maternal Grandfather     Social History   Socioeconomic History  . Marital status: Single    Spouse name: Michelle Short  . Number of children: 1  . Years of education: 89  . Highest education level: Some college, no degree  Occupational History  . Not on file  Tobacco Use  . Smoking status: Never Smoker  . Smokeless tobacco: Never Used  Vaping Use  . Vaping Use: Never used  Substance and Sexual Activity  . Alcohol use: No  .  Drug use: Yes    Types: Marijuana    Comment: daily  . Sexual activity: Yes    Birth control/protection: None, Condom, Patch  Other Topics Concern  . Not on file  Social History Narrative  . Not on file   Social Determinants of Health   Financial Resource Strain: Not on file  Food Insecurity: Not on file  Transportation Needs: Not on file  Physical Activity: Not on file  Stress: Not on file  Social Connections: Not on file  Intimate Partner Violence: Not on file    Review of Systems: Gen: Denies any fever, chills, fatigue, weight loss, lack of appetite.  CV: Denies chest pain,  heart palpitations, peripheral edema, syncope.  Resp: Denies shortness of breath at rest or with exertion. Denies wheezing or cough.  GI: see HPI GU : Denies urinary burning, urinary frequency, urinary hesitancy MS: Denies joint pain, muscle weakness, cramps, or limitation of movement.  Derm: Denies rash, itching, dry skin Psych: Denies depression, anxiety, memory loss, and confusion Heme: Denies bruising, bleeding, and enlarged lymph nodes.  Physical Exam: BP 112/61   Pulse 77   Temp (!) 97.3 F (36.3 C)   Ht 5\' 4"  (1.626 m)   Wt 114 lb (51.7 kg)   LMP 01/13/2021   BMI 19.57 kg/m  General:   Alert and oriented. Pleasant and cooperative. Well-nourished and well-developed.  Head:  Normocephalic and atraumatic. Eyes:  Without icterus, sclera clear and conjunctiva pink.  Ears:  Normal auditory acuity. Mouth:  Mask in place Lungs:  Clear to auscultation bilaterally. No wheezes, rales, or rhonchi. No distress.  Heart:  S1, S2 present without murmurs appreciated.  Abdomen:  +BS, soft, non-tender and non-distended. No HSM noted. No guarding or rebound. No masses appreciated.  Rectal:  Deferred  Msk:  Symmetrical without gross deformities. Normal posture. Extremities:  Without edema. Neurologic:  Alert and  oriented x4;  grossly normal neurologically. Skin:  Intact without significant lesions or rashes. Psych:  Alert and cooperative. Normal mood and affect.  ASSESSMENT: Michelle Short is a 30 y.o. female presenting today with history of hematemesis following a seizure at home in January 2022 and was seen in the ED for evaluation. Hgb at that time 10.9, previously 12.1 in Oct 2021.   She has had no further episodes of hematemesis but does report vague epigastric discomfort that has no precipitating or relieving factors. PPI was prescribed by ED but not taking routinely. No persistent GERD symptoms, no NSAIDs, and no melena.   Query hematemesis due to MW tear, esophagitis, gastritis,  possible underlying PUD. Will pursue diagnostic EGD in near future.   Constipation: mild. Start Benefiber daily.    PLAN: Proceed with upper endoscopy by Dr. Nov 2021 in near future: the risks, benefits, and alternatives have been discussed with the patient in detail. The patient states understanding and desires to proceed.   Take Protonix daily for now  Benefiber daily  Check CBC today  Return in 3-4 months   Michelle Lor, PhD, [facility]

## 2021-02-04 NOTE — Patient Instructions (Signed)
We are arranging an upper endoscopy in the near future.  I recommend taking Protonix daily, making sure to take 30 minutes before breakfast daily as it's best absorbed this way.  We will see you back in 3-4 months!  It was a pleasure to see you today. I want to create trusting relationships with patients to provide genuine, compassionate, and quality care. I value your feedback. If you receive a survey regarding your visit,  I greatly appreciate you taking time to fill this out.   Gelene Mink, PhD, ANP-BC [facility]

## 2021-02-08 ENCOUNTER — Other Ambulatory Visit: Payer: Medicaid Other

## 2021-02-12 ENCOUNTER — Other Ambulatory Visit (HOSPITAL_COMMUNITY)
Admission: RE | Admit: 2021-02-12 | Discharge: 2021-02-12 | Disposition: A | Discharge status: Home / Self Care | Payer: Medicaid Other | Source: Ambulatory Visit | Attending: Internal Medicine | Admitting: Internal Medicine

## 2021-02-12 ENCOUNTER — Other Ambulatory Visit: Payer: Self-pay

## 2021-02-12 DIAGNOSIS — Z20822 Contact with and (suspected) exposure to covid-19: Secondary | ICD-10-CM | POA: Diagnosis not present

## 2021-02-12 DIAGNOSIS — Z01812 Encounter for preprocedural laboratory examination: Secondary | ICD-10-CM | POA: Diagnosis not present

## 2021-02-13 LAB — SARS CORONAVIRUS 2 (TAT 6-24 HRS): SARS Coronavirus 2: NEGATIVE

## 2021-02-15 ENCOUNTER — Ambulatory Visit (HOSPITAL_COMMUNITY): Payer: Medicaid Other | Admitting: Anesthesiology

## 2021-02-15 ENCOUNTER — Ambulatory Visit (HOSPITAL_COMMUNITY)
Admission: RE | Admit: 2021-02-15 | Discharge: 2021-02-15 | Disposition: A | Discharge status: Home / Self Care | Payer: Medicaid Other | Attending: Internal Medicine | Admitting: Internal Medicine

## 2021-02-15 ENCOUNTER — Other Ambulatory Visit: Payer: Self-pay

## 2021-02-15 ENCOUNTER — Encounter (HOSPITAL_COMMUNITY)
Admission: RE | Disposition: A | Discharge status: Home / Self Care | Payer: Self-pay | Source: Home / Self Care | Attending: Internal Medicine

## 2021-02-15 ENCOUNTER — Encounter (HOSPITAL_COMMUNITY): Payer: Self-pay

## 2021-02-15 DIAGNOSIS — Z886 Allergy status to analgesic agent status: Secondary | ICD-10-CM | POA: Diagnosis not present

## 2021-02-15 DIAGNOSIS — Z791 Long term (current) use of non-steroidal anti-inflammatories (NSAID): Secondary | ICD-10-CM | POA: Insufficient documentation

## 2021-02-15 DIAGNOSIS — K920 Hematemesis: Secondary | ICD-10-CM | POA: Diagnosis not present

## 2021-02-15 DIAGNOSIS — Z800 Family history of malignant neoplasm of digestive organs: Secondary | ICD-10-CM | POA: Insufficient documentation

## 2021-02-15 DIAGNOSIS — Z833 Family history of diabetes mellitus: Secondary | ICD-10-CM | POA: Insufficient documentation

## 2021-02-15 DIAGNOSIS — R569 Unspecified convulsions: Secondary | ICD-10-CM | POA: Diagnosis not present

## 2021-02-15 DIAGNOSIS — Z79899 Other long term (current) drug therapy: Secondary | ICD-10-CM | POA: Insufficient documentation

## 2021-02-15 DIAGNOSIS — Z882 Allergy status to sulfonamides status: Secondary | ICD-10-CM | POA: Diagnosis not present

## 2021-02-15 DIAGNOSIS — Z8349 Family history of other endocrine, nutritional and metabolic diseases: Secondary | ICD-10-CM | POA: Insufficient documentation

## 2021-02-15 DIAGNOSIS — Z8249 Family history of ischemic heart disease and other diseases of the circulatory system: Secondary | ICD-10-CM | POA: Insufficient documentation

## 2021-02-15 HISTORY — PX: ESOPHAGOGASTRODUODENOSCOPY (EGD) WITH PROPOFOL: SHX5813

## 2021-02-15 LAB — PREGNANCY, URINE: Preg Test, Ur: NEGATIVE

## 2021-02-15 SURGERY — ESOPHAGOGASTRODUODENOSCOPY (EGD) WITH PROPOFOL
Anesthesia: General

## 2021-02-15 MED ORDER — STERILE WATER FOR IRRIGATION IR SOLN
Status: DC | PRN
  Administered 2021-02-15: 1.5 mL

## 2021-02-15 MED ORDER — MIDAZOLAM HCL 2 MG/2ML IJ SOLN
INTRAMUSCULAR | Status: AC
  Filled 2021-02-15: qty 2

## 2021-02-15 MED ORDER — LACTATED RINGERS IV SOLN: INTRAVENOUS | Status: DC

## 2021-02-15 MED ORDER — MIDAZOLAM HCL 2 MG/2ML IJ SOLN
INTRAMUSCULAR | Status: DC | PRN
  Administered 2021-02-15: 2 mg via INTRAVENOUS

## 2021-02-15 MED ORDER — LIDOCAINE HCL (CARDIAC) PF 100 MG/5ML IV SOSY
PREFILLED_SYRINGE | INTRAVENOUS | Status: DC | PRN
  Administered 2021-02-15: 50 mg via INTRAVENOUS

## 2021-02-15 MED ORDER — PROPOFOL 10 MG/ML IV BOLUS
INTRAVENOUS | Status: DC | PRN
  Administered 2021-02-15: 130 mg via INTRAVENOUS

## 2021-02-15 NOTE — Transfer of Care (Signed)
Immediate Anesthesia Transfer of Care Note  Patient: Michelle Short  Procedure(s) Performed: ESOPHAGOGASTRODUODENOSCOPY (EGD) WITH PROPOFOL (N/A )  Patient Location: Endoscopy Unit  Anesthesia Type:General  Level of Consciousness: drowsy  Airway & Oxygen Therapy: Patient Spontanous Breathing  Post-op Assessment: Report given to RN and Post -op Vital signs reviewed and stable  Post vital signs: Reviewed and stable  Last Vitals:  Vitals Value Taken Time  BP 99/51 02/15/21 1359  Temp 36.5 C 02/15/21 1359  Pulse 74 02/15/21 1359  Resp 16 02/15/21 1359  SpO2 100 % 02/15/21 1359    Last Pain:  Vitals:   02/15/21 1359  TempSrc: Axillary  PainSc: 0-No pain      Patients Stated Pain Goal: 6 (02/15/21 1324)  Complications: No complications documented.

## 2021-02-15 NOTE — Anesthesia Postprocedure Evaluation (Signed)
Anesthesia Post Note  Patient: AQUEELAH COTRELL  Procedure(s) Performed: ESOPHAGOGASTRODUODENOSCOPY (EGD) WITH PROPOFOL (N/A )  Patient location during evaluation: Endoscopy Anesthesia Type: General Level of consciousness: awake and alert and oriented Pain management: pain level controlled Vital Signs Assessment: post-procedure vital signs reviewed and stable Respiratory status: spontaneous breathing and respiratory function stable Cardiovascular status: stable Postop Assessment: no apparent nausea or vomiting Anesthetic complications: no   No complications documented.   Last Vitals:  Vitals:   02/15/21 1324 02/15/21 1359  BP: 111/65 (!) 99/51  Pulse: 70 74  Resp: 19 16  Temp: 36.9 C 36.5 C  SpO2: 99% 100%    Last Pain:  Vitals:   02/15/21 1359  TempSrc: Axillary  PainSc: 0-No pain                 Gautham Hewins C Markos Theil

## 2021-02-15 NOTE — Op Note (Signed)
[facility] Patient Name: Michelle Short Procedure Date: 02/15/2021 1:33 PM MRN: [id number] Date of Birth: 03-18-1991 Attending MD: Elon Alas. Abbey Chatters DO CSN: [id number] Age: 30 Admit Type: Outpatient Procedure:                Upper GI endoscopy Indications:              Hematemesis Providers:                Elon Alas. Abbey Chatters, DO, Caprice Kluver, Raphael Gibney,                            Technician Referring MD:              Medicines:                See the Anesthesia note for documentation of the                            administered medications Complications:            No immediate complications. Estimated Blood Loss:     Estimated blood loss: none. Procedure:                Pre-Anesthesia Assessment:                           - The anesthesia plan was to use monitored                            anesthesia care (MAC).                           After obtaining informed consent, the endoscope was                            passed under direct vision. Throughout the                            procedure, the patient's blood pressure, pulse, and                            oxygen saturations were monitored continuously. The                            [id number]-[id number] ([id number]) scope was introduced through the                            mouth, and advanced to the second part of duodenum.                            The upper GI endoscopy was accomplished without                            difficulty. The patient tolerated the procedure                            well. Scope In: 1:51:53 PM Scope Out: 1:54:36 PM  Total Procedure Duration: 0 hours 2 minutes 43 seconds  Findings:      There is no endoscopic evidence of Barrett's esophagus, bleeding, areas       of erosion, esophagitis, hiatal hernia, ulcerations or varices in the       entire esophagus.      The entire examined stomach was normal.      The ampulla, duodenal bulb, first portion of the duodenum and second       portion of the  duodenum were normal. Impression:               - Normal stomach.                           - Normal ampulla, duodenal bulb, first portion of                            the duodenum and second portion of the duodenum.                           - No specimens collected. Moderate Sedation:      Per Anesthesia Care Recommendation:           - Patient has a contact number available for                            emergencies. The signs and symptoms of potential                            delayed complications were discussed with the                            patient. Return to normal activities tomorrow.                            Written discharge instructions were provided to the                            patient.                           - Resume previous diet.                           - Continue present medications.                           - No obvious source of hematemesis identified.                           - Return to GI clinic PRN. Procedure Code(s):        --- Professional ---                           [phone], Esophagogastroduodenoscopy, flexible,                            transoral; diagnostic, including collection of  specimen(s) by brushing or washing, when performed                            (separate procedure) Diagnosis Code(s):        --- Professional ---                           K92.0, Hematemesis CPT copyright 2019 American Medical Association. All rights reserved. The codes documented in this report are preliminary and upon coder review may  be revised to meet current compliance requirements. Elon Alas. Abbey Chatters, DO [place] Abbey Chatters, DO 02/15/2021 1:59:13 PM This report has been signed electronically. Number of Addenda: 0

## 2021-02-15 NOTE — Anesthesia Preprocedure Evaluation (Signed)
Anesthesia Evaluation  Patient identified by MRN, date of birth, ID band Patient awake    Reviewed: Allergy & Precautions, NPO status , Patient's Chart, lab work & pertinent test results  Airway Mallampati: I  TM Distance: >3 FB Neck ROM: Full    Dental  (+) Dental Advisory Given, Missing, Chipped,    Pulmonary    Pulmonary exam normal breath sounds clear to auscultation       Cardiovascular Normal cardiovascular exam Rhythm:Regular Rate:Normal     Neuro/Psych  Headaches, Seizures -, Well Controlled,  negative psych ROS   GI/Hepatic negative GI ROS, (+)     substance abuse  marijuana use,   Endo/Other  negative endocrine ROS  Renal/GU negative Renal ROS     Musculoskeletal   Abdominal   Peds  Hematology negative hematology ROS (+)   Anesthesia Other Findings   Reproductive/Obstetrics                             Anesthesia Physical Anesthesia Plan  ASA: II  Anesthesia Plan: General   Post-op Pain Management:    Induction: Intravenous  PONV Risk Score and Plan: Propofol infusion  Airway Management Planned: Nasal Cannula and Natural Airway  Additional Equipment:   Intra-op Plan:   Post-operative Plan:   Informed Consent: I have reviewed the patients History and Physical, chart, labs and discussed the procedure including the risks, benefits and alternatives for the proposed anesthesia with the patient or authorized representative who has indicated his/her understanding and acceptance.     Dental advisory given  Plan Discussed with: CRNA and Surgeon  Anesthesia Plan Comments:         Anesthesia Quick Evaluation

## 2021-02-15 NOTE — Anesthesia Procedure Notes (Signed)
Date/Time: 02/15/2021 1:50 PM Performed by: Julian Reil, CRNA Pre-anesthesia Checklist: Patient identified, Emergency Drugs available, Suction available and Patient being monitored Patient Re-evaluated:Patient Re-evaluated prior to induction Oxygen Delivery Method: Nasal cannula Induction Type: IV induction Placement Confirmation: positive ETCO2

## 2021-02-15 NOTE — Interval H&P Note (Signed)
History and Physical Interval Note:  02/15/2021 1:30 PM  Michelle Short  has presented today for surgery, with the diagnosis of hematemesis.  The various methods of treatment have been discussed with the patient and family. After consideration of risks, benefits and other options for treatment, the patient has consented to  Procedure(s) with comments: ESOPHAGOGASTRODUODENOSCOPY (EGD) WITH PROPOFOL (N/A) - PM as a surgical intervention.  The patient's history has been reviewed, patient examined, no change in status, stable for surgery.  I have reviewed the patient's chart and labs.  Questions were answered to the patient's satisfaction.     Lanelle Bal

## 2021-02-15 NOTE — Discharge Instructions (Addendum)
EGD Discharge instructions Please read the instructions outlined below and refer to this sheet in the next few weeks. These discharge instructions provide you with general information on caring for yourself after you leave the hospital. Your doctor may also give you specific instructions. While your treatment has been planned according to the most current medical practices available, unavoidable complications occasionally occur. If you have any problems or questions after discharge, please call your doctor. ACTIVITY  You may resume your regular activity but move at a slower pace for the next 24 hours.   Take frequent rest periods for the next 24 hours.   Walking will help expel (get rid of) the air and reduce the bloated feeling in your abdomen.   No driving for 24 hours (because of the anesthesia (medicine) used during the test).   You may shower.   Do not sign any important legal documents or operate any machinery for 24 hours (because of the anesthesia used during the test).  NUTRITION  Drink plenty of fluids.   You may resume your normal diet.   Begin with a light meal and progress to your normal diet.   Avoid alcoholic beverages for 24 hours or as instructed by your caregiver.  MEDICATIONS  You may resume your normal medications unless your caregiver tells you otherwise.  WHAT YOU CAN EXPECT TODAY  You may experience abdominal discomfort such as a feeling of fullness or "gas" pains.  FOLLOW-UP  Your doctor will discuss the results of your test with you.  SEEK IMMEDIATE MEDICAL ATTENTION IF ANY OF THE FOLLOWING OCCUR:  Excessive nausea (feeling sick to your stomach) and/or vomiting.   Severe abdominal pain and distention (swelling).   Trouble swallowing.   Temperature over 101 F (37.8 C).   Rectal bleeding or vomiting of blood.    Your EGD was normral. I did not find any obvious source of bleeding. Okay to taper off pantoprazole if able from a symptom standpoint.  Follow up with GI as needed.   I hope you have a great rest of your week!  Hennie Duos. Marletta Lor, D.O. Gastroenterology and Hepatology [facility]

## 2021-02-17 ENCOUNTER — Other Ambulatory Visit: Payer: Self-pay

## 2021-02-17 ENCOUNTER — Other Ambulatory Visit (INDEPENDENT_AMBULATORY_CARE_PROVIDER_SITE_OTHER): Payer: Medicaid Other

## 2021-02-17 ENCOUNTER — Other Ambulatory Visit (HOSPITAL_COMMUNITY)
Admission: RE | Admit: 2021-02-17 | Discharge: 2021-02-17 | Disposition: A | Discharge status: Home / Self Care | Payer: Medicaid Other | Source: Ambulatory Visit | Attending: Obstetrics & Gynecology | Admitting: Obstetrics & Gynecology

## 2021-02-17 DIAGNOSIS — N898 Other specified noninflammatory disorders of vagina: Secondary | ICD-10-CM | POA: Diagnosis not present

## 2021-02-17 NOTE — Progress Notes (Addendum)
   NURSE VISIT- VAGINITIS/STD/POC  SUBJECTIVE:  Michelle Short is a 30 y.o. [id number] GYN patientfemale here for a vaginal swab for vaginitis screening, STD screen.  She reports the following symptoms: discharge described as white and malodorous and odor for 2 weeks. Denies abnormal vaginal bleeding, significant pelvic pain, fever, or UTI symptoms.  OBJECTIVE:  There were no vitals taken for this visit.  Appears well, in no apparent distress  ASSESSMENT: Vaginal swab for vaginitis screening  PLAN: Self-collected vaginal probe for Gonorrhea, Chlamydia, Trichomonas, Bacterial Vaginosis, Yeast sent to lab Treatment: to be determined once results are received Follow-up as needed if symptoms persist/worsen, or new symptoms develop  Michelle Short A Michelle Short  02/17/2021 2:52 PM   Chart reviewed for nurse visit. Agree with plan of care.  Michelle Short, [place] 02/17/2021 2:59 PM

## 2021-02-19 ENCOUNTER — Other Ambulatory Visit: Payer: Self-pay | Admitting: Women's Health

## 2021-02-19 LAB — CERVICOVAGINAL ANCILLARY ONLY
Bacterial Vaginitis (gardnerella): POSITIVE — AB
Candida Glabrata: NEGATIVE
Candida Vaginitis: NEGATIVE
Chlamydia: NEGATIVE
Comment: NEGATIVE
Comment: NEGATIVE
Comment: NEGATIVE
Comment: NEGATIVE
Comment: NEGATIVE
Comment: NORMAL
Neisseria Gonorrhea: NEGATIVE
Trichomonas: NEGATIVE

## 2021-02-19 MED ORDER — METRONIDAZOLE 500 MG PO TABS: 500.0000 mg | ORAL_TABLET | Freq: Two times a day (BID) | ORAL | 0 refills | Status: DC

## 2021-02-22 ENCOUNTER — Encounter (HOSPITAL_COMMUNITY): Payer: Self-pay | Admitting: Internal Medicine

## 2021-03-08 ENCOUNTER — Encounter (HOSPITAL_COMMUNITY): Payer: Self-pay

## 2021-03-08 ENCOUNTER — Emergency Department (HOSPITAL_COMMUNITY)
Admission: EM | Admit: 2021-03-08 | Discharge: 2021-03-08 | Disposition: A | Discharge status: Home / Self Care | Payer: Medicaid Other | Attending: Emergency Medicine | Admitting: Emergency Medicine

## 2021-03-08 ENCOUNTER — Other Ambulatory Visit: Payer: Self-pay

## 2021-03-08 DIAGNOSIS — R569 Unspecified convulsions: Secondary | ICD-10-CM | POA: Insufficient documentation

## 2021-03-08 DIAGNOSIS — G4489 Other headache syndrome: Secondary | ICD-10-CM | POA: Diagnosis not present

## 2021-03-08 DIAGNOSIS — R110 Nausea: Secondary | ICD-10-CM | POA: Diagnosis not present

## 2021-03-08 DIAGNOSIS — G40909 Epilepsy, unspecified, not intractable, without status epilepticus: Secondary | ICD-10-CM | POA: Diagnosis not present

## 2021-03-08 DIAGNOSIS — R9431 Abnormal electrocardiogram [ECG] [EKG]: Secondary | ICD-10-CM | POA: Diagnosis not present

## 2021-03-08 DIAGNOSIS — R0902 Hypoxemia: Secondary | ICD-10-CM | POA: Diagnosis not present

## 2021-03-08 LAB — BASIC METABOLIC PANEL
Anion gap: 7 (ref 5–15)
BUN: 8 mg/dL (ref 6–20)
CO2: 24 mmol/L (ref 22–32)
Calcium: 8.7 mg/dL — ABNORMAL LOW (ref 8.9–10.3)
Chloride: 106 mmol/L (ref 98–111)
Creatinine, Ser: 0.72 mg/dL (ref 0.44–1.00)
GFR, Estimated: >60 mL/min (ref >60)
Glucose, Bld: 85 mg/dL (ref 70–99)
Potassium: 4 mmol/L (ref 3.5–5.1)
Sodium: 137 mmol/L (ref 135–145)

## 2021-03-08 LAB — CBC WITH DIFFERENTIAL/PLATELET
Abs Immature Granulocytes: 0.02 10*3/uL (ref 0.00–0.07)
Basophils Absolute: 0 10*3/uL (ref 0.0–0.1)
Basophils Relative: 0 %
Eosinophils Absolute: 0.1 10*3/uL (ref 0.0–0.5)
Eosinophils Relative: 2 %
HCT: 36.6 % (ref 36.0–46.0)
Hemoglobin: 11.6 g/dL — ABNORMAL LOW (ref 12.0–15.0)
Immature Granulocytes: 0 %
Lymphocytes Relative: 28 %
Lymphs Abs: 1.3 10*3/uL (ref 0.7–4.0)
MCH: 29.2 pg (ref 26.0–34.0)
MCHC: 31.7 g/dL (ref 30.0–36.0)
MCV: 92.2 fL (ref 80.0–100.0)
Monocytes Absolute: 0.3 10*3/uL (ref 0.1–1.0)
Monocytes Relative: 7 %
Neutro Abs: 2.8 10*3/uL (ref 1.7–7.7)
Neutrophils Relative %: 63 %
Platelets: 193 10*3/uL (ref 150–400)
RBC: 3.97 MIL/uL (ref 3.87–5.11)
RDW: 12 % (ref 11.5–15.5)
WBC: 4.5 10*3/uL (ref 4.0–10.5)
nRBC: 0 % (ref 0.0–0.2)

## 2021-03-08 LAB — ETHANOL: Alcohol, Ethyl (B): <10 mg/dL (ref <10)

## 2021-03-08 LAB — MAGNESIUM: Magnesium: 2.1 mg/dL (ref 1.7–2.4)

## 2021-03-08 MED ORDER — LEVETIRACETAM IN NACL 1500 MG/100ML IV SOLN
1500.0000 mg | Freq: Once | INTRAVENOUS | Status: AC
  Administered 2021-03-08: 1500 mg via INTRAVENOUS
  Filled 2021-03-08: qty 100

## 2021-03-08 MED ORDER — ACETAMINOPHEN 325 MG PO TABS
650.0000 mg | ORAL_TABLET | Freq: Once | ORAL | Status: AC
  Administered 2021-03-08: 650 mg via ORAL
  Filled 2021-03-08: qty 2

## 2021-03-08 MED ORDER — SODIUM CHLORIDE 0.9 % IV BOLUS
1000.0000 mL | Freq: Once | INTRAVENOUS | Status: AC
  Administered 2021-03-08: 1000 mL via INTRAVENOUS

## 2021-03-08 NOTE — ED Notes (Signed)
Pt in bed with eyes closed, pt arouses to verbal stim, pt states that she is ready to go, pt oriented times three, mom at bedside, pt ambulatory from dpt

## 2021-03-08 NOTE — ED Notes (Signed)
Pt in bed, mom at bedside, pt states that she is sleepy, c/o headache 8/10, requests tylenol, seizure pads placed,

## 2021-03-08 NOTE — ED Triage Notes (Signed)
Patient from home with c/o seizure, patient on keppra, hx of seizures in the past.

## 2021-03-08 NOTE — ED Provider Notes (Signed)
[facility] EMERGENCY DEPARTMENT Provider Note   CSN: [id number] Arrival date & time: 03/08/21  4008     History Chief Complaint  Patient presents with  . Seizures    Michelle Short is a 30 y.o. female with a past medical history of seizure disorder and migraines who presents for seizure.  States: "My mom said I had a seizure."  Denies tongue biting or urinary continence.  She states she has been compliant with her Keppra but did forget to take it yesterday.  Is unsure what triggered the seizure today.  She is currently sleepy but back to her mental baseline.  She is unsure of how long the seizure lasted.  She denies any current headache, nausea or body aches.  HPI     Past Medical History:  Diagnosis Date  . Common migraine with intractable migraine 09/19/2018  . Headache   . Marijuana abuse   . Seizures (HCC) 12/2017    Patient Active Problem List   Diagnosis Date Noted  . Hematemesis 02/04/2021  . Encounter for initial prescription of transdermal patch hormonal contraceptive device 08/11/2020  . Vaginal discharge 08/11/2020  . BV (bacterial vaginosis) 08/11/2020  . Pregnancy examination or test, negative result 08/11/2020  . Spontaneous miscarriage 06/08/2020  . Labial lesion 08/07/2019  . S/P cesarean section for FTP 06/03/2019  . Common migraine with intractable migraine 09/19/2018  . Seizure (HCC) 01/11/2018  . Marijuana abuse     Past Surgical History:  Procedure Laterality Date  . CESAREAN SECTION N/A 06/05/2019   Procedure: CESAREAN SECTION;  Surgeon: Tereso Newcomer, MD;  Location: [facility] LD ORS;  Service: Obstetrics;  Laterality: N/A;  . ESOPHAGOGASTRODUODENOSCOPY (EGD) WITH PROPOFOL N/A 02/15/2021   Procedure: ESOPHAGOGASTRODUODENOSCOPY (EGD) WITH PROPOFOL;  Surgeon: Lanelle Bal, DO;  Location: [facility] ENDO SUITE;  Service: Endoscopy;  Laterality: N/A;  PM     OB History    Gravida  3   Para  1   Term  1   Preterm      AB  2   Living  1      SAB  2   IAB      Ectopic      Multiple  0   Live Births  1           Family History  Problem Relation Age of Onset  . Hyperlipidemia Mother   . Diabetes Paternal Grandfather   . Colon cancer Maternal Grandmother        colon cancer  . Heart attack Maternal Grandfather   . Colon polyps Neg Hx     Social History   Tobacco Use  . Smoking status: Never Smoker  . Smokeless tobacco: Never Used  Vaping Use  . Vaping Use: Never used  Substance Use Topics  . Alcohol use: No  . Drug use: Yes    Types: Marijuana    Comment: daily    Home Medications Prior to Admission medications   Medication Sig Start Date End Date Taking? Authorizing Provider  levETIRAcetam (KEPPRA) 1000 MG tablet Take 1 tablet (1,000 mg total) by mouth 2 (two) times daily. 12/31/20  Yes Glean Salvo, NP  metroNIDAZOLE (FLAGYL) 500 MG tablet Take 1 tablet (500 mg total) by mouth 2 (two) times daily. 02/19/21  Yes Cheral Marker, CNM  pantoprazole (PROTONIX) 40 MG tablet Take 1 tablet (40 mg total) by mouth daily. Patient not taking: Reported on 03/08/2021 12/21/20   Pricilla Loveless, MD  Allergies    Ibuprofen and Sulfa antibiotics  Review of Systems   Review of Systems Ten systems reviewed and are negative for acute change, except as noted in the HPI.   Physical Exam Updated Vital Signs BP 118/66 (BP Location: Right Arm)   Pulse 66   Temp (!) 97.5 F (36.4 C) (Oral)   Resp 17   Ht 5\' 4"  (1.626 m)   Wt 48.1 kg   LMP 03/05/2021   SpO2 100%   BMI 18.19 kg/m   Physical Exam Physical Exam  Nursing note and vitals reviewed. Constitutional: She is oriented to person, place, and time. She appears well-developed and well-nourished. No distress.  HENT:  Head: Normocephalic and atraumatic. (Black dirty substance across the Right face and Right side of the t-shirt - know injury) Mouth: Teeth are intact, no tongue lacerations Eyes: Conjunctivae normal and EOM are normal. Pupils are equal,  round, and reactive to light. No scleral icterus.  Neck: Normal range of motion.  Cardiovascular: Normal rate, regular rhythm and normal heart sounds.  Exam reveals no gallop and no friction rub.   No murmur heard. Pulmonary/Chest: Effort normal and breath sounds normal. No respiratory distress.  Abdominal: Soft. Bowel sounds are normal. She exhibits no distension and no mass. There is no tenderness. There is no guarding.  Neurological: She is alert and oriented to person, place, and time. Speech is clear and goal oriented, follows commands Major Cranial nerves without deficit, no facial droop Normal strength in upper and lower extremities bilaterally including dorsiflexion and plantar flexion, strong and equal grip strength Sensation normal to light and sharp touch Moves extremities without ataxia, coordination intact Normal finger to nose and rapid alternating movements Neg romberg, no pronator drift Skin: Skin is warm and dry. She is not diaphoretic.    ED Results / Procedures / Treatments   Labs (all labs ordered are listed, but only abnormal results are displayed) Labs Reviewed  BASIC METABOLIC PANEL - Abnormal; Notable for the following components:      Result Value   Calcium 8.7 (*)    All other components within normal limits  CBC WITH DIFFERENTIAL/PLATELET - Abnormal; Notable for the following components:   Hemoglobin 11.6 (*)    All other components within normal limits  MAGNESIUM  ETHANOL  RAPID URINE DRUG SCREEN, HOSP PERFORMED  CBG MONITORING, ED  POC URINE PREG, ED    EKG EKG Interpretation  Date/Time:  Monday March 08 2021 11:18:50 EDT Ventricular Rate:  64 PR Interval:  132 QRS Duration: 91 QT Interval:  402 QTC Calculation: 415 R Axis:   130 Text Interpretation: Right and left arm electrode reversal, interpretation assumes no reversal Sinus rhythm Consider right ventricular hypertrophy Abnormal T, consider ischemia, lateral leads Confirmed by [id number]  ([id number]) on 03/08/2021 11:30:43 AM   Radiology No results found.  Procedures Procedures   Medications Ordered in ED Medications  sodium chloride 0.9 % bolus 1,000 mL (0 mLs Intravenous Stopped 03/08/21 1119)  levETIRAcetam (KEPPRA) IVPB 1500 mg/ 100 mL premix (0 mg Intravenous Stopped 03/08/21 1119)  acetaminophen (TYLENOL) tablet 650 mg (650 mg Oral Given 03/08/21 1218)    ED Course  I have reviewed the triage vital signs and the nursing notes.  Pertinent labs & imaging results that were available during my care of the patient were reviewed by me and considered in my medical decision making (see chart for details).    MDM Rules/Calculators/A&P  30 year old female here after single seizure this morning.  History given by the mother at bedside.  Patient had a seizure lasting approximately 1 minute.  Tonic-clonic without tongue biting or loss of bowel or bladder continence with postictal phase.The differential diagnosis for includes but is not limited to idiopathic seizure, traumatic brain injury, intracranial hemorrhage, vascular lesion, mass or space containing lesion, degenerative neurologic disease, congenital brain abnormality, infectious etiology such as meningitis, encephalitis or abscess, metabolic disturbance including hyper or hypoglycemia, hyper or hyponatremia, hyperosmolar state, uremia, hepatic failure, hypocalcemia, hypomagnesemia.  Toxic substances such as cocaine, lidocaine, antidepressants, theophylline, alcohol withdrawal, drug withdrawal, eclampsia, hypertensive encephalopathy and anoxic brain injury. Mother states that she has been otherwise compliant with medications and she feels as though the patient probably has not been getting enough sleep which is likely the trigger for her seizure.  I ordered and reviewed labs and included magnesium, ethanol, BMP and CBC without significant abnormality.  Urine was not collected.  EKG shows sinus rhythm at a rate  of 67.  Patient is back to her baseline mental status although sleepy.  She will be discharged to the care of her mother.  She was given a Keppra loading dose.  He appears otherwise appropriate for discharge at this time without repeat seizure here in the emergency department. Final Clinical Impression(s) / ED Diagnoses Final diagnoses:  Seizure [facility])    Rx / DC Orders ED Discharge Orders    None       Arthor Captain, PA-C 03/08/21 1553    Terrilee Files, MD 03/09/21 1037

## 2021-03-08 NOTE — Discharge Instructions (Addendum)
Please follow up with your neurologist as soon as possible.   Get help right away if: You have: A seizure that does not stop after 5 minutes. Several seizures in a row without a complete recovery between seizures. A seizure that makes it harder to breathe. A seizure that leaves you unable to speak or use a part of your body. You do not wake up right away after a seizure. You injure yourself during a seizure. You have confusion or pain right after a seizure.  Department of Motor Vehicle [facility]) of [place] regulations for seizures - It is the patient's responsibility to report the incidence of the seizure in the state of [place]. [place] has no statutory provision requiring physicians to report patients diagnosed with epilepsy or seizures to a central state agency.  The recommended DMV regulation requirement for a driver in [place] for an individual with a seizure is that they be seizure-free for 6-12 months. However, the DMV may consider the following exceptions to this general rule where: (1) a physician-directed change in medication causes a seizure and the individual immediately resumes the previous therapy which controlled seizures; (2) there is a history of nocturnal seizures or seizures which do not involve loss of consciousness, loss of control of motor function, or loss of appropriate sensation and information process; and (3) an individual has a seizure disorder preceded by an aura (warning) lasting 2-3 minutes. While the [facility] may also give consideration to other unusual circumstances which may affect the general requirement that drivers be seizure-free for 6-12 months, interpretation of these circumstances and assignment of restrictions is at the discretion of the Medical Advisor. The DMV also considers compliance with medical therapy essential for safe driving. [facility] [place] Physician's Guide to [employer] (June, 1995 ed.)] The Department learns of an individual's condition by  inquiring on the application form or renewal form, a physician's report to the [facility], an accident report or from correspondence from the individual. The person may be required to submit a Medical Report Form either annually or semi-annually.  Do not drive, swim, take baths or do any other activities that would be dangerous if you had another seizure.   Contact a health care provider if: You have another seizure. You have seizures more often. Your seizure symptoms change. You continue to have seizures with treatment. You have symptoms of an infection or illness. They might increase your risk of having a seizure. Get help right away if: You have a seizure: That lasts longer than 5 minutes. That is different than previous seizures. That leaves you unable to speak or use a part of your body. That makes it harder to breathe. After a head injury. You have: Multiple seizures in a row. Confusion or a severe headache right after a seizure. You are having seizures more often. You do not wake up immediately after a seizure. You injure yourself during a seizure.

## 2021-03-09 ENCOUNTER — Telehealth: Payer: Self-pay

## 2021-03-09 NOTE — Telephone Encounter (Signed)
Transition Care Management Follow-up Telephone Call  Date of discharge and from where: 03/08/2021 from [facility]  How have you been since you were released from the hospital? Pt stated that she is feeling okay today. Pt is feeling a little weak today but her symptoms are not interfering with her ADL's.   Any questions or concerns? No  Items Reviewed:  Did the pt receive and understand the discharge instructions provided? Yes   Medications obtained and verified? Yes   Other? No   Any new allergies since your discharge? No   Dietary orders reviewed? n/a  Do you have support at home? Yes   Functional Questionnaire: (I = Independent and D = Dependent) ADLs: I  Bathing/Dressing- I  Meal Prep- I  Eating- I  Maintaining continence- I  Transferring/Ambulation- I  Managing Meds- I  Follow up appointments reviewed:   PCP Hospital f/u appt confirmed? No    Specialist Hospital f/u appt confirmed? Yes  Scheduled to see Margie Ege, NP on 06/30/2021 @ 3:15am. Pt is thinking about changing her neurologist.   Are transportation arrangements needed? No   If their condition worsens, is the pt aware to call PCP or go to the Emergency Dept.? Yes  Was the patient provided with contact information for the PCP's office or ED? Yes  Was to pt encouraged to call back with questions or concerns? Yes

## 2021-04-03 ENCOUNTER — Other Ambulatory Visit: Payer: Self-pay

## 2021-04-03 ENCOUNTER — Encounter (HOSPITAL_COMMUNITY): Payer: Self-pay

## 2021-04-03 ENCOUNTER — Emergency Department (HOSPITAL_COMMUNITY)
Admission: EM | Admit: 2021-04-03 | Discharge: 2021-04-03 | Disposition: A | Discharge status: Home / Self Care | Payer: Medicaid Other | Attending: Emergency Medicine | Admitting: Emergency Medicine

## 2021-04-03 DIAGNOSIS — R0902 Hypoxemia: Secondary | ICD-10-CM | POA: Diagnosis not present

## 2021-04-03 DIAGNOSIS — R569 Unspecified convulsions: Secondary | ICD-10-CM | POA: Insufficient documentation

## 2021-04-03 DIAGNOSIS — Z79899 Other long term (current) drug therapy: Secondary | ICD-10-CM | POA: Diagnosis not present

## 2021-04-03 DIAGNOSIS — G40909 Epilepsy, unspecified, not intractable, without status epilepticus: Secondary | ICD-10-CM | POA: Diagnosis not present

## 2021-04-03 MED ORDER — LEVETIRACETAM 500 MG PO TABS
1500.0000 mg | ORAL_TABLET | Freq: Once | ORAL | Status: AC
  Administered 2021-04-03: 1500 mg via ORAL
  Filled 2021-04-03: qty 3

## 2021-04-03 NOTE — Discharge Instructions (Signed)
Take your medication as prescribed.  No driving [place].

## 2021-04-03 NOTE — ED Provider Notes (Signed)
[facility] EMERGENCY DEPARTMENT Provider Note   CSN: [id number] Arrival date & time: 04/03/21  0500     History Chief Complaint  Patient presents with  . Seizures    Michelle Short is a 30 y.o. female.  30 yo F with a chief complaints of a seizure.  Patient has a history of seizures and usually they happen at night.  This 1 happened this evening.  Witnessed by mom.  Lasted less than a minute.  She is now back to her baseline.  Denies any trauma.  An episode yesterday where she had a seizure.  Did not seek medical care at that time.  She has missed at least 1 dose of her medication.  The history is provided by the patient.  Illness Severity:  Moderate Onset quality:  Gradual Duration:  1 minute Timing:  Rare Progression:  Resolved Chronicity:  Recurrent Associated symptoms: no chest pain, no congestion, no fever, no headaches, no myalgias, no nausea, no rhinorrhea, no shortness of breath, no vomiting and no wheezing        Past Medical History:  Diagnosis Date  . Common migraine with intractable migraine 09/19/2018  . Headache   . Marijuana abuse   . Seizures (HCC) 12/2017    Patient Active Problem List   Diagnosis Date Noted  . Hematemesis 02/04/2021  . Encounter for initial prescription of transdermal patch hormonal contraceptive device 08/11/2020  . Vaginal discharge 08/11/2020  . BV (bacterial vaginosis) 08/11/2020  . Pregnancy examination or test, negative result 08/11/2020  . Spontaneous miscarriage 06/08/2020  . Labial lesion 08/07/2019  . S/P cesarean section for FTP 06/03/2019  . Common migraine with intractable migraine 09/19/2018  . Seizure (HCC) 01/11/2018  . Marijuana abuse     Past Surgical History:  Procedure Laterality Date  . CESAREAN SECTION N/A 06/05/2019   Procedure: CESAREAN SECTION;  Surgeon: Tereso Newcomer, MD;  Location: [facility] LD ORS;  Service: Obstetrics;  Laterality: N/A;  . ESOPHAGOGASTRODUODENOSCOPY (EGD) WITH PROPOFOL N/A  02/15/2021   Procedure: ESOPHAGOGASTRODUODENOSCOPY (EGD) WITH PROPOFOL;  Surgeon: Lanelle Bal, DO;  Location: [facility] ENDO SUITE;  Service: Endoscopy;  Laterality: N/A;  PM     OB History    Gravida  3   Para  1   Term  1   Preterm      AB  2   Living  1     SAB  2   IAB      Ectopic      Multiple  0   Live Births  1           Family History  Problem Relation Age of Onset  . Hyperlipidemia Mother   . Diabetes Paternal Grandfather   . Colon cancer Maternal Grandmother        colon cancer  . Heart attack Maternal Grandfather   . Colon polyps Neg Hx     Social History   Tobacco Use  . Smoking status: Never Smoker  . Smokeless tobacco: Never Used  Vaping Use  . Vaping Use: Never used  Substance Use Topics  . Alcohol use: No  . Drug use: Yes    Types: Marijuana    Comment: daily    Home Medications Prior to Admission medications   Medication Sig Start Date End Date Taking? Authorizing Provider  levETIRAcetam (KEPPRA) 1000 MG tablet Take 1 tablet (1,000 mg total) by mouth 2 (two) times daily. 12/31/20   Glean Salvo, NP  metroNIDAZOLE (FLAGYL)  500 MG tablet Take 1 tablet (500 mg total) by mouth 2 (two) times daily. 02/19/21   Cheral Marker, CNM  pantoprazole (PROTONIX) 40 MG tablet Take 1 tablet (40 mg total) by mouth daily. Patient not taking: Reported on 03/08/2021 12/21/20   Pricilla Loveless, MD    Allergies    Ibuprofen and Sulfa antibiotics  Review of Systems   Review of Systems  Constitutional: Negative for chills and fever.  HENT: Negative for congestion and rhinorrhea.   Eyes: Negative for redness and visual disturbance.  Respiratory: Negative for shortness of breath and wheezing.   Cardiovascular: Negative for chest pain and palpitations.  Gastrointestinal: Negative for nausea and vomiting.  Genitourinary: Negative for dysuria and urgency.  Musculoskeletal: Negative for arthralgias and myalgias.  Skin: Negative for pallor and wound.   Neurological: Positive for seizures. Negative for dizziness and headaches.    Physical Exam Updated Vital Signs BP (!) 108/54   Pulse 89   Temp 98.4 F (36.9 C) (Oral)   Resp (!) 22   Ht 5\' 4"  (1.626 m)   Wt 52.2 kg   LMP 03/05/2021   SpO2 100%   BMI 19.74 kg/m   Physical Exam Vitals and nursing note reviewed.  Constitutional:      General: She is not in acute distress.    Appearance: She is well-developed. She is not diaphoretic.  HENT:     Head: Normocephalic and atraumatic.     Mouth/Throat:     Comments: Superficial bite to the tip of the tongue. Eyes:     Pupils: Pupils are equal, round, and reactive to light.  Cardiovascular:     Rate and Rhythm: Normal rate and regular rhythm.     Heart sounds: No murmur heard. No friction rub. No gallop.   Pulmonary:     Effort: Pulmonary effort is normal.     Breath sounds: No wheezing or rales.  Abdominal:     General: There is no distension.     Palpations: Abdomen is soft.     Tenderness: There is no abdominal tenderness.  Musculoskeletal:        General: No tenderness.     Cervical back: Normal range of motion and neck supple.  Skin:    General: Skin is warm and dry.  Neurological:     Mental Status: She is alert and oriented to person, place, and time.  Psychiatric:        Behavior: Behavior normal.     ED Results / Procedures / Treatments   Labs (all labs ordered are listed, but only abnormal results are displayed) Labs Reviewed - No data to display  EKG None  Radiology No results found.  Procedures Procedures   Medications Ordered in ED Medications  levETIRAcetam (KEPPRA) tablet 1,500 mg (1,500 mg Oral Given 04/03/21 0531)    ED Course  I have reviewed the triage vital signs and the nursing notes.  Pertinent labs & imaging results that were available during my care of the patient were reviewed by me and considered in my medical decision making (see chart for details).    MDM  Rules/Calculators/A&P                          30 yo F with a chief complaints of seizure.  Patient has a history of seizures and had a breakthrough seizure.  She did miss a dose of medications.  We will give a loading dose orally here.  Observed in the ED for short period time without recurrence.  Discharge home.  5:48 AM:  I have discussed the diagnosis/risks/treatment options with the patient and believe the pt to be eligible for discharge home to follow-up with PCP. We also discussed returning to the ED immediately if new or worsening sx occur. We discussed the sx which are most concerning (e.g., sudden worsening pain, fever, inability to tolerate by mouth) that necessitate immediate return. Medications administered to the patient during their visit and any new prescriptions provided to the patient are listed below.  Medications given during this visit Medications  levETIRAcetam (KEPPRA) tablet 1,500 mg (1,500 mg Oral Given 04/03/21 0531)     The patient appears reasonably screen and/or stabilized for discharge and I doubt any other medical condition or other [facility] requiring further screening, evaluation, or treatment in the ED at this time prior to discharge.   Final Clinical Impression(s) / ED Diagnoses Final diagnoses:  None    Rx / DC Orders ED Discharge Orders    None       Melene Plan, DO 04/03/21 [phone]

## 2021-04-03 NOTE — ED Triage Notes (Signed)
Pt brought in by [facility] for seizures. Mom witnessed seizure, pt noncompliant with medication. Pt bit her tongue, seems drowsy at this time but alert and oriented, EMS reports pt was post-ictal upon arrival.

## 2021-04-05 ENCOUNTER — Telehealth: Payer: Self-pay | Admitting: Neurology

## 2021-04-05 ENCOUNTER — Telehealth: Payer: Self-pay

## 2021-04-05 NOTE — Telephone Encounter (Signed)
Transition Care Management Follow-up Telephone Call  Date of discharge and from where: 04/03/2021 from [facility]  How have you been since you were released from the hospital? Pt stated that she is feeling okay.  Any questions or concerns? No  Items Reviewed:  Did the pt receive and understand the discharge instructions provided? Yes   Medications obtained and verified? Yes   Other? No   Any new allergies since your discharge? No   Dietary orders reviewed? n/a  Do you have support at home? Yes   Home Care and Equipment/Supplies: Functional Questionnaire: (I = Independent and D = Dependent) ADLs: I  Bathing/Dressing- I  Meal Prep- I  Eating- I  Maintaining continence- I  Transferring/Ambulation- I  Managing Meds- I   Follow up appointments reviewed:   PCP Hospital f/u appt confirmed? No    Specialist Hospital f/u appt confirmed? No   Are transportation arrangements needed? No   If their condition worsens, is the pt aware to call PCP or go to the Emergency Dept.? Yes  Was the patient provided with contact information for the PCP's office or ED? Yes  Was to pt encouraged to call back with questions or concerns? Yes

## 2021-04-05 NOTE — Telephone Encounter (Signed)
Noted.  No availabiliy as yet.  SS/CW.

## 2021-04-05 NOTE — Telephone Encounter (Signed)
Received an ER note 04/03/21 for reported seizure, in the evening, witnessed by her mom, less than a minute.  Missed at least 1 dose of Keppra. Looking back, also in ER seizure 03/08/21, forgot 1 dose. When last seen in Feb 2022 was taking Keppra 1000 mg twice daily. Coming up for revisit, see if you can schedule her within next few weeks, we can pursue EEG.

## 2021-04-06 NOTE — Telephone Encounter (Signed)
I called pt and was gettng her in for 04-15-21 slot at 1015 (SS/CW). When went to make the appt, was taken.  Will have to call her when cancellation.  She has questions.  I told her to write down the questions so discuss when in.

## 2021-04-08 ENCOUNTER — Other Ambulatory Visit (INDEPENDENT_AMBULATORY_CARE_PROVIDER_SITE_OTHER): Payer: Medicaid Other

## 2021-04-08 ENCOUNTER — Other Ambulatory Visit: Payer: Self-pay

## 2021-04-08 DIAGNOSIS — N898 Other specified noninflammatory disorders of vagina: Secondary | ICD-10-CM

## 2021-04-08 NOTE — Progress Notes (Signed)
Chart reviewed for nurse visit. Agree with plan of care.  Adline Potter, NP 04/08/2021 4:36 PM

## 2021-04-08 NOTE — Progress Notes (Signed)
   NURSE VISIT- VAGINITIS/STD/POC  SUBJECTIVE:  Michelle Short is a 30 y.o. [id number] GYN patientfemale here for a vaginal swab for vaginitis screening.  She reports the following symptoms:odor and discharge described as white for 3 days.  States these symptoms always come after her period. Denies abnormal vaginal bleeding, significant pelvic pain, fever, or UTI symptoms.  OBJECTIVE:  There were no vitals taken for this visit.  Appears well, in no apparent distress  ASSESSMENT: Vaginal swab for vaginitis screening  PLAN: Self-collected vaginal probe for Bacterial Vaginosis, Yeast sent to lab Treatment: to be determined once results are received Follow-up as needed if symptoms persist/worsen, or new symptoms develop  Jobe Marker  04/08/2021 3:20 PM

## 2021-04-12 LAB — CERVICOVAGINAL ANCILLARY ONLY
Bacterial Vaginitis (gardnerella): POSITIVE — AB
Candida Glabrata: NEGATIVE
Candida Vaginitis: NEGATIVE
Comment: NEGATIVE
Comment: NEGATIVE
Comment: NEGATIVE

## 2021-04-13 ENCOUNTER — Other Ambulatory Visit: Payer: Self-pay | Admitting: Adult Health

## 2021-04-13 MED ORDER — METRONIDAZOLE 500 MG PO TABS: 500.0000 mg | ORAL_TABLET | Freq: Two times a day (BID) | ORAL | 0 refills | Status: DC

## 2021-04-13 NOTE — Progress Notes (Signed)
Vaginal swab +BV will rx flagyl 

## 2021-04-15 ENCOUNTER — Ambulatory Visit: Payer: Medicaid Other | Admitting: Women's Health

## 2021-04-29 ENCOUNTER — Ambulatory Visit: Payer: Medicaid Other | Admitting: Women's Health

## 2021-04-29 ENCOUNTER — Encounter: Payer: Self-pay | Admitting: Women's Health

## 2021-04-29 ENCOUNTER — Other Ambulatory Visit: Payer: Self-pay

## 2021-04-29 VITALS — BP 113/73 | HR 68 | Ht 64.0 in | Wt 110.0 lb

## 2021-04-29 DIAGNOSIS — N760 Acute vaginitis: Secondary | ICD-10-CM | POA: Diagnosis not present

## 2021-04-29 MED ORDER — METRONIDAZOLE 0.75 % VA GEL: 1.0000 | Freq: Once | VAGINAL | 6 refills | Status: AC

## 2021-04-29 NOTE — Progress Notes (Signed)
   GYN VISIT Patient name: Michelle Short MRN [id number]  Date of birth: Dec 05, 1990 Chief Complaint:   Recurrent BV (Wants to discuss why she keeps getting this-no current symptoms)  History of Present Illness:   Michelle Short is a 30 y.o. [phone] African-American female being seen today to discuss why she keeps getting BV. Seems to happen after periods. Had it in March and April. Not sexually active. Mom said it happened to her as well around this age. Doesn't douche, uses Dove sensitive soap.  Denies current sx.  Patient's last menstrual period was 04/26/2021. The current method of family planning is abstinence.  Last pap 07/30/19. Results were: NILM w/ HRHPV negative  Depression screen [facility] 2/9 07/30/2019 10/31/2018  Decreased Interest 0 0  Down, Depressed, Hopeless 0 0  PHQ - 2 Score 0 0  Altered sleeping - 0  Tired, decreased energy - 0  Change in appetite - 0  Feeling bad or failure about yourself  - 0  Trouble concentrating - 0  Moving slowly or fidgety/restless - 0  PHQ-9 Score - 0    No flowsheet data found.   Review of Systems:   Pertinent items are noted in HPI Denies fever/chills, dizziness, headaches, visual disturbances, fatigue, shortness of breath, chest pain, abdominal pain, vomiting, abnormal vaginal discharge/itching/odor/irritation, problems with periods, bowel movements, urination, or intercourse unless otherwise stated above.  Pertinent History Reviewed:  Reviewed past medical,surgical, social, obstetrical and family history.  Reviewed problem list, medications and allergies. Physical Assessment:   Vitals:   04/29/21 1604  BP: 113/73  Pulse: 68  Weight: 110 lb (49.9 kg)  Height: 5\' 4"  (1.626 m)  Body mass index is 18.88 kg/m.       Physical Examination:   General appearance: alert, well appearing, and in no distress  Mental status: alert, oriented to person, place, and time  Skin: warm & dry   Cardiovascular: normal heart rate noted  Respiratory:  normal respiratory effort, no distress  Abdomen: soft, non-tender   Pelvic: examination not indicated  Extremities: no edema   Chaperone: N/A    No results found for this or any previous visit (from the past 24 hour(s)).  Assessment & Plan:  1) Recurrent postmenstrual BV> rx metrogel to use after periods, gave recurrent vaginitis tips/tricks  Meds:  Meds ordered this encounter  Medications   metroNIDAZOLE (METROGEL VAGINAL) 0.75 % vaginal gel    Sig: Place 1 Applicatorful vaginally once for 1 dose. After periods    Dispense:  70 g    Refill:  6    Order Specific Question:   Supervising Provider    Answer:   , LUTHER H [[id number]]    No orders of the defined types were placed in this encounter.   Return for prn.  Despina Hidden CNM, [facility] 04/29/2021 4:29 PM

## 2021-04-29 NOTE — Patient Instructions (Signed)
Recurrent Vaginitis Alternative Therapies  Both options are to be done after sexual intercourse, your period ends, and when you think you may have bacterial vaginosis (BV) or a yeast infection.  If symptoms persist you will need to schedule an appointment to be seen  1) Soak in tub of waist- high warm water with 1/2 cup of baking soda for at least 20 mins.  2) Soak 3 tampons in 1 tablespoon of fractionated (liquid form) coconut oil with 10 drops of Melaleuca (Tea Tree) essential oil, insert 1 saturated tampon vaginally at bedtime x 3 days.    You can purchase Melaleuca/Tea Tree oil online at amazon.com, with a [employer] or [employer] representative, or locally at:  [employer] [address] [place], [place] [address] [phone]  [employer] [address] [place], [place] [address] [phone]  You may also want to consider making the changes below:  . Soap: Unscented Dove (white box light green writing)  . Wash cloth: use a separate white washcloth for your genital area . Laundry detergent: Dreft or unscented Arm n' Hammer  . Underwear: White 100% cotton panties (NOT just cotton crouch) . Sanitary pads/tampons: Unscented only-If it doesn't SAY unscented it can have a scent/perfume    . NO PERFUMES OR LOTIONS OR POTIONS in the genital area (may use regular KY) . Condoms: hypoallergenic only, non-dyed (no color) . Toilet paper: White unscented only   

## 2021-05-11 ENCOUNTER — Ambulatory Visit: Payer: Medicaid Other | Admitting: Gastroenterology

## 2021-05-11 ENCOUNTER — Encounter: Payer: Self-pay | Admitting: Internal Medicine

## 2021-06-30 ENCOUNTER — Ambulatory Visit: Payer: Medicaid Other | Admitting: Neurology

## 2021-07-27 ENCOUNTER — Ambulatory Visit: Payer: Medicaid Other | Admitting: Neurology

## 2021-08-07 ENCOUNTER — Other Ambulatory Visit: Payer: Self-pay | Admitting: Neurology

## 2021-09-29 ENCOUNTER — Ambulatory Visit: Payer: Medicaid Other | Admitting: Gastroenterology

## 2021-09-29 ENCOUNTER — Encounter: Payer: Self-pay | Admitting: Gastroenterology

## 2021-11-09 ENCOUNTER — Other Ambulatory Visit: Payer: Self-pay | Admitting: Neurology

## 2021-11-09 NOTE — Telephone Encounter (Signed)
Per last note on 12/31/20, the patient should be taking generic Keppra 1000mg , one tab BID.  Received a refill request from the pharmacy for 500mg , one tab BID.  I called the patient. States she went back to the 500mg  BID because she did not like the way the increased dose made her feel (mental fog, zombie-like, sleepiness) She is a at her job and has two year old son at home. She could not function with these adverse effects. Denies any recent seizure. Last episode in May 2022 when she went to the ED. She has scheduled a follow in February 2023.   She would like to continue at her current dose. Since this is a variation from the last medical plan, refill will be sent to Sarah for her review.   The patient understands to call our office to report any seizure-like activity.

## 2021-11-09 NOTE — Telephone Encounter (Signed)
I returned the call to the patient. States when she had her seizures at that dose, she was not always taking the medication correctly. Says at one point, she was going to wean herself off completely and realized it was not a good idea. She is now taking the 500mg , one tab BID regularly and has not had any further episodes. She does not wish to increase the dose. She understands the risks of going again medical advice. She is willing to keep her current appt and talk about it more at that time. She will call for any seizure activity.

## 2021-11-09 NOTE — Telephone Encounter (Signed)
She previously had seizure on 500 mg BID, would she be willing to try higher 750 mg BID. We may need to see sooner to discuss medication switch if side effects are too great. I did approve the script, she would do 1.5 tablets twice daily.

## 2022-01-11 NOTE — Progress Notes (Signed)
PATIENT: Michelle Short DOB: October 22, 1991  REASON FOR VISIT: follow up for seizures HISTORY FROM: patient PRIMARY NEUROLOGIST: Dr. Teresa Coombs  HISTORY OF PRESENT ILLNESS: Today 01/12/22 Drue here today for follow-up with history of headaches and seizures.  Last seizure was in May 2022, went to the ER. In the past admits to not being compliant with Keppra with seizures, at 1 time was supposed to be taking 1000 mg twice daily. Currently taking 500 mg twice daily Keppra no seizure since May 2022. 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 Is compliant with her medications now. Sometime has irritability from Keppra. All seizures have been nocturnal.   Update 12/31/2020 SS: Michelle Short is a 31 year old female with history of migraine headache and seizures. Was in the ER September 07, 2020 for reported seizure.  CT head was negative.  She sent a MyChart message, Keppra was increased to 750 mg twice a day. Was recently in the ER December 21, 2020, reportedly woke up, felt drowsy like she had seizure, went downstairs to get her sister. Then had another seizure, generalized seizure, no oral injury or incontinence. 2 seizures in the ambulance.Reports vomiting with blood, after seizures. For 2 days after she was coughing up blood. She didn't miss any doses of medication. Under more stress, just moved in with grandmother, single parent to 83 month old. Currently on Keppra 1000 mg twice daily. Seeing GI in March, vomiting has gone away. Taking Protonix daily. Mild occasional burning.  No more seizures.  Lipase, LFT, PT/INR, ETOH, unremarkable. WBC was 14.1, HGB 10.9.  HISTORY 07/23/2020 SS: Michelle Short is a 31 year old female with history of migraine headaches and seizures.  Last nocturnal seizure was in March 2021, she had missed 2 doses of Keppra.  At last visit, was switched to Keppra extended release, but didn't fill the medication for some reason.  She remains on Keppra 500 mg twice a day, reports  compliance.  No recurrent seizure.  Indicates she has been doing well, tolerating well.  No new problems or concerns.  She has a 74-year-old son. Presents today for evaluation unaccompanied.    REVIEW OF SYSTEMS: Out of a complete 14 system review of symptoms, the patient complains only of the following symptoms, and all other reviewed systems are negative.  See HPI  ALLERGIES: Allergies  Allergen Reactions   Ibuprofen Other (See Comments)    "interferes with my seizure medication"   Sulfa Antibiotics Rash    HOME MEDICATIONS: Outpatient Medications Prior to Visit  Medication Sig Dispense Refill   levETIRAcetam (KEPPRA) 500 MG tablet Take 1 tablet (500 mg total) by mouth 2 (two) times daily. 180 tablet 0   pantoprazole (PROTONIX) 40 MG tablet Take 1 tablet (40 mg total) by mouth daily. (Patient not taking: Reported on 03/08/2021) 30 tablet 0   No facility-administered medications prior to visit.    PAST MEDICAL HISTORY: Past Medical History:  Diagnosis Date   Common migraine with intractable migraine 09/19/2018   Headache    Marijuana abuse    Seizures (HCC) 12/2017    PAST SURGICAL HISTORY: Past Surgical History:  Procedure Laterality Date   CESAREAN SECTION N/A 06/05/2019   Procedure: CESAREAN SECTION;  Surgeon: Tereso Newcomer, MD;  Location: [facility];  Service: Obstetrics;  Laterality: N/A;   ESOPHAGOGASTRODUODENOSCOPY (EGD) WITH PROPOFOL N/A 02/15/2021   Procedure: ESOPHAGOGASTRODUODENOSCOPY (EGD) WITH PROPOFOL;  Surgeon: Lanelle Bal, DO;  Location: [facility] SUITE;  Service: Endoscopy;  Laterality: N/A;  PM    FAMILY HISTORY: Family History  Problem Relation Age of Onset   Hyperlipidemia Mother    Diabetes Paternal Grandfather    Colon cancer Maternal Grandmother        colon cancer   Heart attack Maternal Grandfather    Colon polyps Neg Hx     SOCIAL HISTORY: Social History   Socioeconomic History   Marital status: Single    Spouse name: Irving Copas    Number of children: 1   Years of education: 15   Highest education level: Some college, no degree  Occupational History   Not on file  Tobacco Use   Smoking status: Never   Smokeless tobacco: Never  Vaping Use   Vaping Use: Never used  Substance and Sexual Activity   Alcohol use: No   Drug use: Yes    Types: Marijuana    Comment: daily   Sexual activity: Yes    Birth control/protection: None, Condom, Patch  Other Topics Concern   Not on file  Social History Narrative   Not on file   Social Determinants of Health   Financial Resource Strain: Not on file  Food Insecurity: Not on file  Transportation Needs: Not on file  Physical Activity: Not on file  Stress: Not on file  Social Connections: Not on file  Intimate Partner Violence: Not on file   PHYSICAL EXAM  Vitals:   01/12/22 1331  BP: 118/62  Pulse: 71  SpO2: 98%  Weight: 109 lb (49.4 kg)  Height: 5' 4.5" (1.638 m)    Body mass index is 18.42 kg/m.  Generalized: Well developed, in no acute distress   Neurological examination  Mentation: Alert oriented to time, place, history taking. Follows all commands speech and language fluent Cranial nerve II-XII: Pupils were equal round reactive to light. Extraocular movements were full, visual field were full on confrontational test. Facial sensation and strength were normal.  Head turning and shoulder shrug  were normal and symmetric. Motor: The motor testing reveals 5 over 5 strength of all 4 extremities. Good symmetric motor tone is noted throughout.  Sensory: Sensory testing is intact to soft touch on all 4 extremities. No evidence of extinction is noted.  Coordination: Cerebellar testing reveals good finger-nose-finger and heel-to-shin bilaterally.  Gait and station: Gait is normal.  Reflexes: Deep tendon reflexes are symmetric and normal bilaterally.   DIAGNOSTIC DATA (LABS, IMAGING, TESTING) - I reviewed patient records, labs, notes, testing and imaging myself  where available.  Lab Results  Component Value Date   WBC 4.5 03/08/2021   HGB 11.6 (L) 03/08/2021   HCT 36.6 03/08/2021   MCV 92.2 03/08/2021   PLT 193 03/08/2021      Component Value Date/Time   NA 137 03/08/2021 1004   K 4.0 03/08/2021 1004   CL 106 03/08/2021 1004   CO2 24 03/08/2021 1004   GLUCOSE 85 03/08/2021 1004   BUN 8 03/08/2021 1004   CREATININE 0.72 03/08/2021 1004   CALCIUM 8.7 (L) 03/08/2021 1004   PROT 6.6 12/21/2020 1325   ALBUMIN 4.0 12/21/2020 1325   AST 30 12/21/2020 1325   ALT 27 12/21/2020 1325   ALKPHOS 59 12/21/2020 1325   BILITOT 0.8 12/21/2020 1325   GFRNONAA >60 03/08/2021 1004   GFRAA >60 05/24/2020 1616   No results found for: CHOL, HDL, LDLCALC, LDLDIRECT, TRIG, CHOLHDL No results found for: MWUX3K No results found for: VITAMINB12 No results found for: TSH  ASSESSMENT AND PLAN 31 y.o. year  old female  has a past medical history of Common migraine with intractable migraine (09/19/2018), Headache, Marijuana abuse, and Seizures (HCC) (12/2017). here with:  1.  Seizures  -last seizure was in May 2022, in the setting of Keppra non-compliance, was supposed to be taking 1000 mg twice daily, all her seizures have been nocturnal generalized with oral injury, and she admits all related to missed doses -currently taking Keppra 500 mg twice daily, no seizure in 9 months, I will check Keppra level, EEG -have discussed the importance of medication compliance, not to miss any doses!  -in the past I have switched to XR Keppra, but she didn't want to take, I talked with her about switching AED if irritability is a bothersome side effect  -EEG in Feb 2019 showed occasional focal slowing over left temporal region -MRI brain with and without contrast, MRV was normal in Feb 2019 -call for seizures, return back in 6 months or sooner if needed, will be followed by Dr. Teresa Coombs since Dr. Anne Hahn retired   Margie Ege, AGNP-C, DNP 01/12/2022, 1:56 PM [facility] [address], [address] [place], [place] [address] [phone]

## 2022-01-12 ENCOUNTER — Encounter: Payer: Self-pay | Admitting: Neurology

## 2022-01-12 ENCOUNTER — Ambulatory Visit: Payer: Medicaid Other | Admitting: Neurology

## 2022-01-12 VITALS — BP 118/62 | HR 71 | Ht 64.5 in | Wt 109.0 lb

## 2022-01-12 DIAGNOSIS — R569 Unspecified convulsions: Secondary | ICD-10-CM | POA: Diagnosis not present

## 2022-01-12 MED ORDER — LEVETIRACETAM 500 MG PO TABS: 500.0000 mg | ORAL_TABLET | Freq: Two times a day (BID) | ORAL | 3 refills | Status: DC

## 2022-01-12 NOTE — Patient Instructions (Signed)
Make sure taking Keppra daily, not to miss any doses! Check EEG Check Keppra level today  Call for any seizures See you back in 6 months

## 2022-01-14 LAB — LEVETIRACETAM LEVEL: Levetiracetam Lvl: <2 ug/mL — ABNORMAL LOW (ref 10.0–40.0)

## 2022-01-17 ENCOUNTER — Telehealth: Payer: Self-pay | Admitting: Neurology

## 2022-01-17 MED ORDER — FOLIC ACID 1 MG PO TABS: 1.0000 mg | ORAL_TABLET | Freq: Every day | ORAL | 11 refills | Status: DC

## 2022-01-17 NOTE — Telephone Encounter (Signed)
I called patient. She reports that she has started taking keppra 500mg  BID again. She will start folic acid as prescribed. She will keep her EEG appointment as scheduled for tomorrow. Pt verbalized understanding of results. Pt had no questions at this time but was encouraged to call back if questions arise.

## 2022-01-17 NOTE — Telephone Encounter (Signed)
Keppra level returned < 2.0 indicating non-compliance. Please emphasize the importance of taking daily, not to miss any doses. When I saw her she seemed motivated to be compliant. She has EEG scheduled for tomorrow. Please ask her to take Folic acid 1 mg daily, I will send in. If there is a reason she doesn't like Keppra, we can switch to something else, which I discussed with her at our office visit.

## 2022-01-18 ENCOUNTER — Other Ambulatory Visit: Payer: Medicaid Other | Admitting: *Deleted

## 2022-01-27 ENCOUNTER — Emergency Department (HOSPITAL_COMMUNITY)
Admission: EM | Admit: 2022-01-27 | Discharge: 2022-01-27 | Disposition: A | Discharge status: Home / Self Care | Payer: Medicaid Other | Attending: Emergency Medicine | Admitting: Emergency Medicine

## 2022-01-27 ENCOUNTER — Other Ambulatory Visit: Payer: Self-pay

## 2022-01-27 ENCOUNTER — Emergency Department (HOSPITAL_COMMUNITY): Payer: Medicaid Other

## 2022-01-27 ENCOUNTER — Encounter (HOSPITAL_COMMUNITY): Payer: Self-pay

## 2022-01-27 DIAGNOSIS — K529 Noninfective gastroenteritis and colitis, unspecified: Secondary | ICD-10-CM | POA: Insufficient documentation

## 2022-01-27 DIAGNOSIS — R112 Nausea with vomiting, unspecified: Secondary | ICD-10-CM | POA: Diagnosis present

## 2022-01-27 LAB — COMPREHENSIVE METABOLIC PANEL
ALT: 17 U/L (ref 0–44)
AST: 23 U/L (ref 15–41)
Albumin: 4.1 g/dL (ref 3.5–5.0)
Alkaline Phosphatase: 56 U/L (ref 38–126)
Anion gap: 7 (ref 5–15)
BUN: 11 mg/dL (ref 6–20)
CO2: 27 mmol/L (ref 22–32)
Calcium: 8.7 mg/dL — ABNORMAL LOW (ref 8.9–10.3)
Chloride: 104 mmol/L (ref 98–111)
Creatinine, Ser: 0.65 mg/dL (ref 0.44–1.00)
GFR, Estimated: >60 mL/min (ref >60)
Glucose, Bld: 85 mg/dL (ref 70–99)
Potassium: 3.4 mmol/L — ABNORMAL LOW (ref 3.5–5.1)
Sodium: 138 mmol/L (ref 135–145)
Total Bilirubin: 0.4 mg/dL (ref 0.3–1.2)
Total Protein: 7.2 g/dL (ref 6.5–8.1)

## 2022-01-27 LAB — CBC WITH DIFFERENTIAL/PLATELET
Abs Immature Granulocytes: 0.01 10*3/uL (ref 0.00–0.07)
Basophils Absolute: 0 10*3/uL (ref 0.0–0.1)
Basophils Relative: 0 %
Eosinophils Absolute: 0.1 10*3/uL (ref 0.0–0.5)
Eosinophils Relative: 1 %
HCT: 37.6 % (ref 36.0–46.0)
Hemoglobin: 12.2 g/dL (ref 12.0–15.0)
Immature Granulocytes: 0 %
Lymphocytes Relative: 22 %
Lymphs Abs: 1.2 10*3/uL (ref 0.7–4.0)
MCH: 29.2 pg (ref 26.0–34.0)
MCHC: 32.4 g/dL (ref 30.0–36.0)
MCV: 90 fL (ref 80.0–100.0)
Monocytes Absolute: 0.5 10*3/uL (ref 0.1–1.0)
Monocytes Relative: 8 %
Neutro Abs: 3.7 10*3/uL (ref 1.7–7.7)
Neutrophils Relative %: 69 %
Platelets: 255 10*3/uL (ref 150–400)
RBC: 4.18 MIL/uL (ref 3.87–5.11)
RDW: 12.2 % (ref 11.5–15.5)
WBC: 5.5 10*3/uL (ref 4.0–10.5)
nRBC: 0 % (ref 0.0–0.2)

## 2022-01-27 LAB — POC URINE PREG, ED: Preg Test, Ur: NEGATIVE

## 2022-01-27 LAB — URINALYSIS, ROUTINE W REFLEX MICROSCOPIC
Bilirubin Urine: NEGATIVE
Glucose, UA: NEGATIVE mg/dL
Hgb urine dipstick: NEGATIVE
Ketones, ur: NEGATIVE mg/dL
Leukocytes,Ua: NEGATIVE
Nitrite: NEGATIVE
Protein, ur: NEGATIVE mg/dL
Specific Gravity, Urine: 1.014 (ref 1.005–1.030)
pH: 7 (ref 5.0–8.0)

## 2022-01-27 LAB — LIPASE, BLOOD: Lipase: 264 U/L — ABNORMAL HIGH (ref 11–51)

## 2022-01-27 MED ORDER — LOPERAMIDE HCL 2 MG PO CAPS
2.0000 mg | ORAL_CAPSULE | Freq: Four times a day (QID) | ORAL | 0 refills | Status: DC | PRN
Start: 2022-01-27 — End: 2022-08-19

## 2022-01-27 MED ORDER — IOHEXOL 300 MG/ML  SOLN
100.0000 mL | Freq: Once | INTRAMUSCULAR | Status: AC | PRN
  Administered 2022-01-27: 06:00:00 100 mL via INTRAVENOUS

## 2022-01-27 MED ORDER — SODIUM CHLORIDE 0.9 % IV BOLUS
1000.0000 mL | Freq: Once | INTRAVENOUS | Status: AC
  Administered 2022-01-27: 04:00:00 1000 mL via INTRAVENOUS

## 2022-01-27 MED ORDER — ONDANSETRON HCL 4 MG PO TABS: 4.0000 mg | ORAL_TABLET | Freq: Four times a day (QID) | ORAL | 0 refills | Status: DC

## 2022-01-27 MED ORDER — HYOSCYAMINE SULFATE 0.125 MG SL SUBL
0.2500 mg | SUBLINGUAL_TABLET | Freq: Once | SUBLINGUAL | Status: AC
  Administered 2022-01-27: 04:00:00 0.25 mg via ORAL
  Filled 2022-01-27: qty 2

## 2022-01-27 MED ORDER — HYOSCYAMINE SULFATE 0.125 MG PO TABS: 0.2500 mg | ORAL_TABLET | Freq: Once | ORAL | Status: DC

## 2022-01-27 MED ORDER — ONDANSETRON HCL 4 MG/2ML IJ SOLN
4.0000 mg | Freq: Once | INTRAMUSCULAR | Status: AC
  Administered 2022-01-27: 04:00:00 4 mg via INTRAVENOUS
  Filled 2022-01-27: qty 2

## 2022-01-27 NOTE — ED Triage Notes (Signed)
Pt states that she has had lower abdominal pain that started last Saturday. Has been having vomiting/ diarrhea. States that family in the home has also had similar symptoms.  ?

## 2022-01-27 NOTE — ED Provider Notes (Signed)
[facility] EMERGENCY DEPARTMENT Provider Note   CSN: [id number] Arrival date & time: 01/27/22  0253     History  Chief Complaint  Patient presents with   Abdominal Pain    Michelle Short is a 30 y.o. female.  Patient presents to the emergency department for evaluation of abdominal pain and cramping.  Symptoms have been present for several days.  Initially she had nausea and vomiting, none today.  She did feel like she was going to have diarrhea today but could not pass any stools.  She has been trying to drink as much as she can to stay hydrated, urine has been dark.  Patient reports that her son had a GI illness this week.  Her symptoms began the day after he was seen in the ED and diagnosed with gastroenteritis.       Home Medications Prior to Admission medications   Medication Sig Start Date End Date Taking? Authorizing Provider  loperamide (IMODIUM) 2 MG capsule Take 1 capsule (2 mg total) by mouth 4 (four) times daily as needed for diarrhea or loose stools. 01/27/22  Yes Shaheim Mahar, Canary Brim, MD  ondansetron (ZOFRAN) 4 MG tablet Take 1 tablet (4 mg total) by mouth every 6 (six) hours. 01/27/22  Yes Burnice Oestreicher, Canary Brim, MD  folic acid (FOLVITE) 1 MG tablet Take 1 tablet (1 mg total) by mouth daily. 01/17/22   Glean Salvo, NP  levETIRAcetam (KEPPRA) 500 MG tablet Take 1 tablet (500 mg total) by mouth 2 (two) times daily. 01/12/22   Glean Salvo, NP      Allergies    Ibuprofen and Sulfa antibiotics    Review of Systems   Review of Systems  Gastrointestinal:  Positive for abdominal pain, nausea and vomiting.   Physical Exam Updated Vital Signs BP 105/69    Pulse 86    Temp 98 F (36.7 C)    Resp 17    Ht 5' 4.17" (1.63 m)    Wt 49.4 kg    SpO2 100%    BMI 18.61 kg/m  Physical Exam Vitals and nursing note reviewed.  Constitutional:      General: She is not in acute distress.    Appearance: She is well-developed.  HENT:     Head: Normocephalic and atraumatic.      Mouth/Throat:     Mouth: Mucous membranes are moist.  Eyes:     General: Vision grossly intact. Gaze aligned appropriately.     Extraocular Movements: Extraocular movements intact.     Conjunctiva/sclera: Conjunctivae normal.  Cardiovascular:     Rate and Rhythm: Normal rate and regular rhythm.     Pulses: Normal pulses.     Heart sounds: Normal heart sounds, S1 normal and S2 normal. No murmur heard.   No friction rub. No gallop.  Pulmonary:     Effort: Pulmonary effort is normal. No respiratory distress.     Breath sounds: Normal breath sounds.  Abdominal:     General: Bowel sounds are normal.     Palpations: Abdomen is soft.     Tenderness: There is generalized abdominal tenderness. There is no guarding or rebound.     Hernia: No hernia is present.  Musculoskeletal:        General: No swelling.     Cervical back: Full passive range of motion without pain, normal range of motion and neck supple. No spinous process tenderness or muscular tenderness. Normal range of motion.     Right lower leg:  No edema.     Left lower leg: No edema.  Skin:    General: Skin is warm and dry.     Capillary Refill: Capillary refill takes less than 2 seconds.     Findings: No ecchymosis, erythema, rash or wound.  Neurological:     General: No focal deficit present.     Mental Status: She is alert and oriented to person, place, and time.     GCS: GCS eye subscore is 4. GCS verbal subscore is 5. GCS motor subscore is 6.     Cranial Nerves: Cranial nerves 2-12 are intact.     Sensory: Sensation is intact.     Motor: Motor function is intact.     Coordination: Coordination is intact.  Psychiatric:        Attention and Perception: Attention normal.        Mood and Affect: Mood normal.        Speech: Speech normal.        Behavior: Behavior normal.    ED Results / Procedures / Treatments   Labs (all labs ordered are listed, but only abnormal results are displayed) Labs Reviewed  COMPREHENSIVE  METABOLIC PANEL - Abnormal; Notable for the following components:      Result Value   Potassium 3.4 (*)    Calcium 8.7 (*)    All other components within normal limits  LIPASE, BLOOD - Abnormal; Notable for the following components:   Lipase 264 (*)    All other components within normal limits  CBC WITH DIFFERENTIAL/PLATELET  URINALYSIS, ROUTINE W REFLEX MICROSCOPIC  POC URINE PREG, ED    EKG None  Radiology CT ABDOMEN PELVIS W CONTRAST  Result Date: 01/27/2022 CLINICAL DATA:  Abdominal pain, vomiting, diarrhea. EXAM: CT ABDOMEN AND PELVIS WITH CONTRAST TECHNIQUE: Multidetector CT imaging of the abdomen and pelvis was performed using the standard protocol following bolus administration of intravenous contrast. RADIATION DOSE REDUCTION: This exam was performed according to the departmental dose-optimization program which includes automated exposure control, adjustment of the mA and/or kV according to patient size and/or use of iterative reconstruction technique. CONTRAST:  OMNIPAQUE IOHEXOL 300 MG/ML  SOLN COMPARISON:  None. FINDINGS: Factors affecting image quality: Abundant respiratory motion limits evaluation of the abdomen. Also, a general paucity of body fat limits the subject contrast. Lower chest: Normal aorta and hepatic portal vein. There is moderate pelvic venous congestion, left-greater-than-right with extension to the outer uterus Hepatobiliary: No focal liver masses seen through the breathing motion. The gallbladder and bile ducts are unremarkable. Pancreas: No focal abnormality is seen through the breathing motion. Spleen: No focal abnormality is seen through the breathing motion. Adrenals/Urinary Tract: There is no adrenal mass or focal renal cortical abnormality. No evidence of urinary stones or obstruction. Unremarkable bladder wall and lumen. Stomach/Bowel: There are thickened folds in the proximal stomach, left abdominal small bowel segments. There is no small bowel  obstruction. The appendix is only visible proximally, distally is obscured by overlapping structures but visualized portion normal caliber. There is wall thickening versus nondistention in the proximal descending colon, mild fecal stasis. Vascular/Lymphatic: Normal aorta and hepatic portal vein. There is moderate pelvic venous congestion on the left-greater-than-right, extending to the outer uterus. Reproductive: Intact uterus. No adnexal mass. 2.5 cm dominant follicle noted left ovary. Measures 11.1 Hounsfield units. No follow-up imaging required. There is an ovoid left paravaginal cyst along the introitus measuring 2.5 x 0.8 cm. Infectious collection not strictly excluded. Hounsfield density 21.3 units. Other:  There is navel piercing causing streak artifact. There is minimal low-density fluid in the pelvic cul-de-sac. There is no free air, hemorrhage or abscess. No incarcerated hernia. Musculoskeletal: No acute or significant osseous findings. IMPRESSION: 1. Findings of gastroenteritis. No focal inflammatory changes are seen, no bowel obstruction. 2. Descending colitis versus nondistention. 3. Left-greater-than-right moderate pelvic venous congestion. 4. Ovoid left paravaginal cyst or infectious collection measuring 2.4 x 0.8 cm. 5. The appendix obscured distally but normal where visible. 6. Minimal low-density pelvic cul-de-sac fluid, nonspecific. Frequently physiologic at this age. 7. General paucity of body fat and breathing motion limiting the study. Electronically Signed   By: Almira BarKeith  Chesser M.D.   On: 01/27/2022 06:40    Procedures Procedures    Medications Ordered in ED Medications  sodium chloride 0.9 % bolus 1,000 mL (0 mLs Intravenous Stopped 01/27/22 0639)  ondansetron (ZOFRAN) injection 4 mg (4 mg Intravenous Given 01/27/22 0347)  hyoscyamine (LEVSIN SL) SL tablet 0.25 mg (0.25 mg Oral Given 01/27/22 0347)  iohexol (OMNIPAQUE) 300 MG/ML solution 100 mL (100 mLs Intravenous Contrast Given 01/27/22  [id number])    ED Course/ Medical Decision Making/ A&P                           Medical Decision Making Amount and/or Complexity of Data Reviewed Labs: ordered. Radiology: ordered.  Risk Prescription drug management.   Patient presents to the emergency department for evaluation of nausea and vomiting.  She does have a sick contact at home.  Differential diagnosis includes viral gastroenteritis, cholecystitis, peptic ulcer disease, colitis, pancreatitis.  Patient underwent lab work.  She did have a mildly elevated, nonspecific lipase level, otherwise lab work was reassuring.  CT scan was performed to further evaluate.  No peripancreatic inflammation, findings consistent with gastroenteritis.  We will continue symptomatic treatment.         Final Clinical Impression(s) / ED Diagnoses Final diagnoses:  Gastroenteritis    Rx / DC Orders ED Discharge Orders          Ordered    ondansetron (ZOFRAN) 4 MG tablet  Every 6 hours        01/27/22 0656    loperamide (IMODIUM) 2 MG capsule  4 times daily PRN        01/27/22 0656              Gilda CreasePollina, Cap Massi J, MD 01/27/22 [phone] [id number]

## 2022-01-28 ENCOUNTER — Telehealth: Payer: Self-pay

## 2022-01-28 NOTE — Telephone Encounter (Signed)
Transition Care Management Follow-up Telephone Call ?Date of discharge and from where: 01/27/2022 from [facility] ?How have you been since you were released from the hospital? Patient stated that she is feeling okay. Patient is still having diarrhea but she has not picked up her medication just yet from the pharmacy. Patient did not have any questions or concerns at this time.  ?Any questions or concerns? No ? ?Items Reviewed: ?Did the pt receive and understand the discharge instructions provided? Yes  ?Medications obtained and verified? Yes  ?Other? No  ?Any new allergies since your discharge? No  ?Dietary orders reviewed? No ?Do you have support at home? Yes  ? ?Functional Questionnaire: (I = Independent and D = Dependent) ?ADLs: I ? ?Bathing/Dressing- I ? ?Meal Prep- I ? ?Eating- I ? ?Maintaining continence- I ? ?Transferring/Ambulation- I ? ?Managing Meds- I ? ? ?Follow up appointments reviewed: ? ?PCP Hospital f/u appt confirmed? No   ?Specialist Hospital f/u appt confirmed? No   ?Are transportation arrangements needed? No  ?If their condition worsens, is the pt aware to call PCP or go to the Emergency Dept.? Yes ?Was the patient provided with contact information for the PCP's office or ED? Yes ?Was to pt encouraged to call back with questions or concerns? Yes ? ?

## 2022-05-11 ENCOUNTER — Other Ambulatory Visit (HOSPITAL_COMMUNITY)
Admission: RE | Admit: 2022-05-11 | Discharge: 2022-05-11 | Disposition: A | Discharge status: Home / Self Care | Payer: Medicaid Other | Source: Ambulatory Visit | Attending: Adult Health | Admitting: Adult Health

## 2022-05-11 ENCOUNTER — Encounter: Payer: Self-pay | Admitting: Adult Health

## 2022-05-11 ENCOUNTER — Ambulatory Visit: Payer: Medicaid Other | Admitting: Adult Health

## 2022-05-11 VITALS — BP 111/70 | HR 78 | Ht 64.0 in | Wt 105.0 lb

## 2022-05-11 DIAGNOSIS — N9089 Other specified noninflammatory disorders of vulva and perineum: Secondary | ICD-10-CM

## 2022-05-11 DIAGNOSIS — Z124 Encounter for screening for malignant neoplasm of cervix: Secondary | ICD-10-CM | POA: Insufficient documentation

## 2022-05-11 NOTE — Progress Notes (Signed)
  Subjective:     Patient ID: Michelle Short, female   DOB: October 16, 1991, 31 y.o.   MRN: [id number]  HPI Michelle Short is a 31 year old black female,single, G3P1021 in complaining of ? Bartholin cyst getter bigger, she wants it removed, hurts with sex. She needs pap.   Review of Systems Has ?bartholin cyst getting bigger, non tender,hurst with sex  Reviewed past medical,surgical, social and family history. Reviewed medications and allergies.     Objective:   Physical Exam BP 111/70 (BP Location: Right Arm, Patient Position: Sitting, Cuff Size: Normal)   Pulse 78   Ht 5\' 4"  (1.626 m)   Wt 105 lb (47.6 kg)   LMP 04/15/2022 (Approximate)   Breastfeeding No   BMI 18.02 kg/m     Skin warm and dry.Pelvic: external genitalia is normal in appearance, has round,mobile, non tender, mass about size of shooter marble left labia, is bigger than in 2021,near where bartholin gland would be but felt to be more in labia,  vagina: pink and moist,urethra has no lesions or masses noted, cervix:smooth and bulbous,Pap with GC/CHL and HR HPV genotyping performed, uterus: normal size, shape and contour, non tender, no masses felt, adnexa: no masses or tenderness noted. Bladder is non tender and no masses felt.  Upstream - 05/11/22 1056       Pregnancy Intention Screening   Does the patient want to become pregnant in the next year? No    Does the patient's partner want to become pregnant in the next year? No    Would the patient like to discuss contraceptive options today? No      Contraception Wrap Up   Current Method Abstinence    End Method Abstinence    Contraception Counseling Provided No            Examination chaperoned by 05/13/22 LPN  Assessment:     1. Routine cervical smear Pap sent with GC/CHL and HR HPV genotyping  Pap in 3 years if normal   2. Labial lesion -is round,mobile, and non tender mass, left labia,feels like shooter marble, whish is bigger than in 2021, she wants it  removed, hurts with sex -see Dr 2022 in about 3 weeks about removal options    Plan:     Return in 3 weeks to see Dr [name] Pap in years if normal

## 2022-05-13 LAB — CYTOLOGY - PAP
Adequacy: ABSENT
Chlamydia: NEGATIVE
Comment: NEGATIVE
Comment: NEGATIVE
Comment: NORMAL
Diagnosis: NEGATIVE
High risk HPV: NEGATIVE
Neisseria Gonorrhea: NEGATIVE

## 2022-06-01 ENCOUNTER — Ambulatory Visit: Payer: Medicaid Other | Admitting: Obstetrics & Gynecology

## 2022-07-13 ENCOUNTER — Ambulatory Visit: Payer: Medicaid Other | Admitting: Neurology

## 2022-07-13 ENCOUNTER — Encounter: Payer: Self-pay | Admitting: Neurology

## 2022-08-18 ENCOUNTER — Other Ambulatory Visit: Payer: Self-pay

## 2022-08-18 ENCOUNTER — Emergency Department (HOSPITAL_COMMUNITY): Payer: Medicaid Other

## 2022-08-18 ENCOUNTER — Emergency Department (HOSPITAL_COMMUNITY)
Admission: EM | Admit: 2022-08-18 | Discharge: 2022-08-19 | Disposition: A | Discharge status: Home / Self Care | Payer: Medicaid Other | Attending: Emergency Medicine | Admitting: Emergency Medicine

## 2022-08-18 DIAGNOSIS — J189 Pneumonia, unspecified organism: Secondary | ICD-10-CM | POA: Diagnosis not present

## 2022-08-18 DIAGNOSIS — R0902 Hypoxemia: Secondary | ICD-10-CM | POA: Diagnosis not present

## 2022-08-18 DIAGNOSIS — G40901 Epilepsy, unspecified, not intractable, with status epilepticus: Secondary | ICD-10-CM | POA: Diagnosis not present

## 2022-08-18 DIAGNOSIS — R059 Cough, unspecified: Secondary | ICD-10-CM | POA: Diagnosis not present

## 2022-08-18 DIAGNOSIS — R079 Chest pain, unspecified: Secondary | ICD-10-CM | POA: Diagnosis not present

## 2022-08-18 DIAGNOSIS — R569 Unspecified convulsions: Secondary | ICD-10-CM | POA: Insufficient documentation

## 2022-08-18 DIAGNOSIS — R110 Nausea: Secondary | ICD-10-CM | POA: Diagnosis not present

## 2022-08-18 DIAGNOSIS — R9431 Abnormal electrocardiogram [ECG] [EKG]: Secondary | ICD-10-CM | POA: Diagnosis not present

## 2022-08-18 DIAGNOSIS — M25551 Pain in right hip: Secondary | ICD-10-CM | POA: Diagnosis not present

## 2022-08-18 MED ORDER — LEVETIRACETAM IN NACL 1000 MG/100ML IV SOLN
1000.0000 mg | Freq: Once | INTRAVENOUS | Status: AC
Start: 2022-08-18 — End: 2022-08-19
  Administered 2022-08-18: 1000 mg via INTRAVENOUS
  Filled 2022-08-18: qty 100

## 2022-08-18 MED ORDER — ACETAMINOPHEN 325 MG PO TABS
650.0000 mg | ORAL_TABLET | Freq: Once | ORAL | Status: AC
  Administered 2022-08-18: 650 mg via ORAL
  Filled 2022-08-18: qty 2

## 2022-08-18 NOTE — ED Triage Notes (Signed)
Pt bib [facility] from work for witnessed tonic clonic seizure that lasted approx 4 minutes and stopped without intervention. Pt began to feel aura and laid down - no trauma. Hx of seizures and missed dose of keppra this morning. GCS 15. 4mg  zofran given pta.

## 2022-08-18 NOTE — ED Provider Notes (Signed)
[facility] EMERGENCY DEPARTMENT Provider Note   CSN: [id number] Arrival date & time: 08/18/22  2241     History  Chief Complaint  Patient presents with   Seizures    Michelle Short is a 31 y.o. female.  The history is provided by the patient.  Seizures Patient with known history of seizures presents after having seizure at work.  It is reported patient had generalized seizure for about 4 minutes and stopped spontaneously.  Patient reports she started feeling "weird" beforehand and then woke up on the ground.  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  She reports she has a severe headache and right hip pain.  She also reports she is coughing up blood but she did bite her tongue   Patient reports she is on Keppra twice a day.  She reports she is supposed take Keppra 1000 mg twice a day.  She missed this morning's dose. Last seizure was earlier this month Home Medications Prior to Admission medications   Medication Sig Start Date End Date Taking? Authorizing Provider  levETIRAcetam (KEPPRA) 500 MG tablet Take 1 tablet (500 mg total) by mouth 2 (two) times daily. 01/12/22  Yes Glean Salvo, NP  folic acid (FOLVITE) 1 MG tablet Take 1 tablet (1 mg total) by mouth daily. Patient not taking: Reported on 05/11/2022 01/17/22   Glean Salvo, NP      Allergies    Ibuprofen and Sulfa antibiotics    Review of Systems   Review of Systems  Constitutional:  Negative for fever.  Musculoskeletal:  Positive for arthralgias.  Neurological:  Positive for seizures and headaches.    Physical Exam Updated Vital Signs BP (!) 97/56   Pulse 69   Temp 97.6 F (36.4 C) (Oral)   Resp 16   Ht 1.626 m (5\' 4" )   Wt 47.6 kg   SpO2 97%   BMI 18.01 kg/m  Physical Exam CONSTITUTIONAL: Well developed/well nourished HEAD: Normocephalic/atraumatic, no signs of trauma EYES: EOMI/PERRL, no nystagmus ENMT: Mucous membranes  moist, small laceration to anterior tongue no active bleeding NECK: supple no meningeal signs SPINE/BACK:entire spine nontender, no bruising/crepitance/stepoffs noted to spine CV: S1/S2 noted, no murmurs/rubs/gallops noted LUNGS: Lungs are clear to auscultation bilaterally, no apparent distress ABDOMEN: soft, nontender NEURO: Pt is resting with her eyes closed but wakes up and answers all questions appropriately.  Moves all extremities x4.  GCS 15 EXTREMITIES: pulses normal/equal, full ROM Tenderness with  range of motion of right hip SKIN: warm, color normal PSYCH: no abnormalities of mood noted, alert and oriented to situation  ED Results / Procedures / Treatments   Labs (all labs ordered are listed, but only abnormal results are displayed) Labs Reviewed  CBC WITH DIFFERENTIAL/PLATELET - Abnormal; Notable for the following components:      Result Value   RBC 3.44 (*)    Hemoglobin 9.9 (*)    HCT 31.2 (*)    Neutro Abs 8.6 (*)    All other components within normal limits  BASIC METABOLIC PANEL - Abnormal; Notable for the following components:   Potassium 3.3 (*)    Calcium 8.5 (*)    Anion gap 4 (*)    All other components within normal limits  I-STAT BETA HCG BLOOD, ED ([facility], [facility], [facility] ONLY)    EKG EKG Interpretation  Date/Time:  Thursday August 18 2022 23:51:25 EDT Ventricular Rate:  85 PR Interval:  152 QRS Duration: 87 QT Interval:  349 QTC Calculation: 415 R Axis:   68 Text Interpretation: Sinus rhythm Confirmed by Zadie Rhine ([id number]) on 08/19/2022 12:18:23 AM  Radiology CT Head Wo Contrast  Result Date: 08/19/2022 CLINICAL DATA:  Seizure disorder, clinical change EXAM: CT HEAD WITHOUT CONTRAST TECHNIQUE: Contiguous axial images were obtained from the base of the skull through the vertex without intravenous contrast. RADIATION DOSE REDUCTION: This exam was performed according to the departmental dose-optimization program which includes automated exposure control,  adjustment of the mA and/or kV according to patient size and/or use of iterative reconstruction technique. COMPARISON:  09/07/2020 FINDINGS: Brain: No acute intracranial abnormality. Specifically, no hemorrhage, hydrocephalus, mass lesion, acute infarction, or significant intracranial injury. Vascular: No hyperdense vessel or unexpected calcification. Skull: No acute calvarial abnormality. Sinuses/Orbits: No acute findings Other: None IMPRESSION: No acute intracranial abnormality. Electronically Signed   By: Charlett Nose M.D.   On: 08/19/2022 01:05   DG HIP UNILAT WITH PELVIS 2-3 VIEWS RIGHT  Result Date: 08/18/2022 CLINICAL DATA:  Pain EXAM: DG HIP (WITH OR WITHOUT PELVIS) 2-3V RIGHT COMPARISON:  None Available. FINDINGS: There is no evidence of hip fracture or dislocation. There is no evidence of arthropathy or other focal bone abnormality. IMPRESSION: Negative. Electronically Signed   By: Jasmine Pang M.D.   On: 08/18/2022 23:57   DG Chest Port 1 View  Result Date: 08/18/2022 CLINICAL DATA:  Cough and pain.  Seizure. EXAM: PORTABLE CHEST 1 VIEW COMPARISON:  Chest x-ray 09/07/2020 FINDINGS: There is patchy bilateral upper lobe airspace disease, right greater than left. No pleural effusion or pneumothorax. Cardiomediastinal silhouette is within normal limits. Osseous structures are within normal limits. IMPRESSION: Patchy bilateral upper lobe airspace dise follow-up x-ray recommended ase, right greater than left, concerning for pneumonia. In 4-6 weeks to confirm resolution. Electronically Signed   By: Darliss Cheney M.D.   On: 08/18/2022 23:55    Procedures Procedures    Medications Ordered in ED Medications  sodium chloride 0.9 % bolus 1,000 mL (has no administration in time range)  acetaminophen (TYLENOL) tablet 650 mg (has no administration in time range)  acetaminophen (TYLENOL) tablet 650 mg (650 mg Oral Given 08/18/22 2351)  levETIRAcetam (KEPPRA) IVPB 1000 mg/100 mL premix (0 mg Intravenous  Stopped 08/19/22 0144)  ketorolac (TORADOL) 15 MG/ML injection 15 mg (15 mg Intravenous Given 08/19/22 0200)  amoxicillin-clavulanate (AUGMENTIN) 875-125 MG per tablet 1 tablet (1 tablet Oral Given 08/19/22 0201)    ED Course/ Medical Decision Making/ A&P Clinical Course as of 08/19/22 0348  Thu Aug 18, 2022  2331 Patient with headache after having a seizure.  Patient reports missing a dose earlier in the day.  Will obtain CT head.  She also reports coughing up blood which could be due to tongue laceration.  She has had this previously per neurology notes.  Also obtain chest x-ray and right hip x-ray due to pain [DW]  Fri Aug 19, 2022  0057 Hemoglobin(!): 9.9 Mild anemia [DW]  0156 No acute findings on CT head.  However patient does have evidence of pneumonia.  She denies any recent cough or congestion.  This could be aspiration pneumonia.  We will give Augmentin [DW]  0347 Patient improved and ambulatory.  She has no hypoxia on room air.  She is safe for outpatient management of pneumonia [DW]  [phone] Discussed need for follow-up with neurology.  She reports she has prescriptions for Keppra at home.  Discussed with her  that she will not be able to drive and will need to follow-up with neurology due to multiple seizures [DW]  [phone] Patient is otherwise safe and appropriate for outpatient management [DW]    Clinical Course User Index [DW] Ripley Fraise, MD                           Medical Decision Making Amount and/or Complexity of Data Reviewed Labs: ordered. Decision-making details documented in ED Course. Radiology: ordered.  Risk OTC drugs. Prescription drug management.   This patient presents to the ED for concern of seizure, this involves an extensive number of treatment options, and is a complaint that carries with it a high risk of complications and morbidity.  The differential diagnosis includes but is not limited to status epilepticus, breakthrough seizure, drug  toxidrome  Comorbidities that complicate the patient evaluation: Patient's presentation is complicated by their history of seizures  Social Determinants of Health: Patient's  history of missed medications   increases the complexity of managing their presentation  Additional history obtained: Records reviewed  neurology reports reviewed  Lab Tests: I Ordered, and personally interpreted labs.  The pertinent results include:  labs unremarkable  Imaging Studies ordered: I ordered imaging studies including CT scan head and X-ray chest and right hip   I independently visualized and interpreted imaging which showed ct head negative, CXR reveals pneumonia I agree with the radiologist interpretation  Cardiac Monitoring: The patient was maintained on a cardiac monitor.  I personally viewed and interpreted the cardiac monitor which showed an underlying rhythm of:  sinus rhythm  Medicines ordered and prescription drug management: I ordered medication including Keppra for seizure Toradol for headache Reevaluation of the patient after these medicines showed that the patient    improved   Critical Interventions:  antibiotics, keppra for seizures   Reevaluation: After the interventions noted above, I reevaluated the patient and found that they have :improved  Complexity of problems addressed: Patient's presentation is most consistent with  acute presentation with potential threat to life or bodily function  Disposition: After consideration of the diagnostic results and the patient's response to treatment,  I feel that the patent would benefit from discharge   .           Final Clinical Impression(s) / ED Diagnoses Final diagnoses:  Seizures ([place])  Community acquired pneumonia, unspecified laterality    Rx / DC Orders ED Discharge Orders     None         Ripley Fraise, MD 08/19/22 [phone]

## 2022-08-19 ENCOUNTER — Emergency Department (HOSPITAL_COMMUNITY): Payer: Medicaid Other

## 2022-08-19 DIAGNOSIS — R569 Unspecified convulsions: Secondary | ICD-10-CM | POA: Diagnosis not present

## 2022-08-19 LAB — CBC WITH DIFFERENTIAL/PLATELET
Abs Immature Granulocytes: 0.03 10*3/uL (ref 0.00–0.07)
Basophils Absolute: 0 10*3/uL (ref 0.0–0.1)
Basophils Relative: 0 %
Eosinophils Absolute: 0.1 10*3/uL (ref 0.0–0.5)
Eosinophils Relative: 1 %
HCT: 31.2 % — ABNORMAL LOW (ref 36.0–46.0)
Hemoglobin: 9.9 g/dL — ABNORMAL LOW (ref 12.0–15.0)
Immature Granulocytes: 0 %
Lymphocytes Relative: 11 %
Lymphs Abs: 1.2 10*3/uL (ref 0.7–4.0)
MCH: 28.8 pg (ref 26.0–34.0)
MCHC: 31.7 g/dL (ref 30.0–36.0)
MCV: 90.7 fL (ref 80.0–100.0)
Monocytes Absolute: 0.5 10*3/uL (ref 0.1–1.0)
Monocytes Relative: 5 %
Neutro Abs: 8.6 10*3/uL — ABNORMAL HIGH (ref 1.7–7.7)
Neutrophils Relative %: 83 %
Platelets: 224 10*3/uL (ref 150–400)
RBC: 3.44 MIL/uL — ABNORMAL LOW (ref 3.87–5.11)
RDW: 12.7 % (ref 11.5–15.5)
WBC: 10.3 10*3/uL (ref 4.0–10.5)
nRBC: 0 % (ref 0.0–0.2)

## 2022-08-19 LAB — BASIC METABOLIC PANEL
Anion gap: 4 — ABNORMAL LOW (ref 5–15)
BUN: 7 mg/dL (ref 6–20)
CO2: 29 mmol/L (ref 22–32)
Calcium: 8.5 mg/dL — ABNORMAL LOW (ref 8.9–10.3)
Chloride: 106 mmol/L (ref 98–111)
Creatinine, Ser: 0.71 mg/dL (ref 0.44–1.00)
GFR, Estimated: >60 mL/min (ref >60)
Glucose, Bld: 89 mg/dL (ref 70–99)
Potassium: 3.3 mmol/L — ABNORMAL LOW (ref 3.5–5.1)
Sodium: 139 mmol/L (ref 135–145)

## 2022-08-19 LAB — I-STAT BETA HCG BLOOD, ED (MC, WL, AP ONLY): I-stat hCG, quantitative: <5 m[IU]/mL (ref <5)

## 2022-08-19 MED ORDER — ACETAMINOPHEN 325 MG PO TABS
650.0000 mg | ORAL_TABLET | Freq: Once | ORAL | Status: AC
Start: 2022-08-19 — End: 2022-08-19
  Administered 2022-08-19: 650 mg via ORAL
  Filled 2022-08-19: qty 2

## 2022-08-19 MED ORDER — SODIUM CHLORIDE 0.9 % IV BOLUS (SEPSIS)
1000.0000 mL | Freq: Once | INTRAVENOUS | Status: AC
  Administered 2022-08-19: 1000 mL via INTRAVENOUS

## 2022-08-19 MED ORDER — AMOXICILLIN-POT CLAVULANATE 875-125 MG PO TABS
1.0000 | ORAL_TABLET | Freq: Once | ORAL | Status: AC
  Administered 2022-08-19: 1 via ORAL
  Filled 2022-08-19: qty 1

## 2022-08-19 MED ORDER — AMOXICILLIN-POT CLAVULANATE 875-125 MG PO TABS: 1.0000 | ORAL_TABLET | Freq: Two times a day (BID) | ORAL | 0 refills | Status: DC

## 2022-08-19 MED ORDER — KETOROLAC TROMETHAMINE 15 MG/ML IJ SOLN
15.0000 mg | Freq: Once | INTRAMUSCULAR | Status: AC
  Administered 2022-08-19: 15 mg via INTRAVENOUS
  Filled 2022-08-19: qty 1

## 2022-08-19 NOTE — Discharge Planning (Signed)
[facility] department consulted regarding transportation needs for pt.  RNCM visited the lobby and called for pt several times with no answer.

## 2022-08-19 NOTE — ED Notes (Signed)
Patient has no way home states she has tried to call everyone . Sitting in the lobby waiting on social consult.

## 2022-08-19 NOTE — Discharge Planning (Signed)
RNCM called pt to inquire if she still needed a ride home; but pt had been picked up by her cousin.

## 2022-08-19 NOTE — Discharge Instructions (Signed)
Please be aware you may have another seizure ° °Do not drive until seen by your physician for your condition ° °Do not climb ladders/roofs/trees as a seizure can occur at that height and cause serious harm ° °Do not bathe/swim alone as a seizure can occur and cause serious harm ° °Please followup with your physician or neurologist for further testing and possible treatment ° ° °

## 2022-08-22 ENCOUNTER — Telehealth: Payer: Self-pay | Admitting: Neurology

## 2022-08-22 NOTE — Telephone Encounter (Signed)
Please call the patient, received an ER note 08/18/22 for seizure.  Under a lot of stress working a new job, had missed her morning dose of Keppra on the day of the seizure.  ER note reports her last seizure was earlier this month.  CT head was unremarkable Chest x-ray was concerning for pneumonia  She was given Augmentin.  Please offer her earlier follow-up appointment.  Emphasize the importance of compliance with Keppra.  She is not to drive until seizure-free for 6 months.  I would also offer her an appointment to see Dr. April Manson.  I have tried to switch her to XR Keppra, and discussed other medications if Keppra is not agreeable to the mood.

## 2022-08-23 NOTE — Telephone Encounter (Signed)
Thank you, will follow up.

## 2022-08-24 NOTE — Telephone Encounter (Signed)
LVM requesting call back.

## 2022-08-26 NOTE — Telephone Encounter (Signed)
Pt has called Leilani Able, RN back.  Pt asking for a call back on Monday

## 2022-08-29 NOTE — Telephone Encounter (Signed)
Thanks [employer]. I would suggest she increase the Keppra if she reported a recent seizure last week while compliant. At 1 time she was supposed to be taking Keppra 1000 mg BID, but she remained on 500 mg BID. If she tolerates Keppra, she can do 750 mg twice daily for 1 week then take 1000 mg twice daily. If there is an issue with Keppra, we can switch to something else all together.

## 2022-08-29 NOTE — Telephone Encounter (Signed)
I returned the pt's call. She is agreeable to scheduling appt with Dr. April Manson. I have scheduled for 09/20/2022 along with adding her to the wait list. She sts she had another mild seizure last week. Reports compliance with Keppra and understands she is not drive until 6 months seizure free.

## 2022-08-29 NOTE — Telephone Encounter (Signed)
I called pt and scheduled her f/u for 09/20/2022 with Dr. April Manson, added to the wait list for a sooner appt as well.

## 2022-08-29 NOTE — Telephone Encounter (Signed)
Agree with above plans 

## 2022-09-17 ENCOUNTER — Emergency Department (HOSPITAL_COMMUNITY)
Admission: EM | Admit: 2022-09-17 | Discharge: 2022-09-17 | Disposition: A | Discharge status: Home / Self Care | Payer: Medicaid Other | Attending: Emergency Medicine | Admitting: Emergency Medicine

## 2022-09-17 DIAGNOSIS — R569 Unspecified convulsions: Secondary | ICD-10-CM

## 2022-09-17 DIAGNOSIS — G40909 Epilepsy, unspecified, not intractable, without status epilepticus: Secondary | ICD-10-CM | POA: Diagnosis not present

## 2022-09-17 DIAGNOSIS — S00502A Unspecified superficial injury of oral cavity, initial encounter: Secondary | ICD-10-CM | POA: Diagnosis not present

## 2022-09-17 DIAGNOSIS — X58XXXA Exposure to other specified factors, initial encounter: Secondary | ICD-10-CM | POA: Diagnosis not present

## 2022-09-17 DIAGNOSIS — S0083XA Contusion of other part of head, initial encounter: Secondary | ICD-10-CM | POA: Diagnosis not present

## 2022-09-17 DIAGNOSIS — S0990XA Unspecified injury of head, initial encounter: Secondary | ICD-10-CM | POA: Diagnosis present

## 2022-09-17 DIAGNOSIS — Y990 Civilian activity done for income or pay: Secondary | ICD-10-CM | POA: Insufficient documentation

## 2022-09-17 DIAGNOSIS — N9489 Other specified conditions associated with female genital organs and menstrual cycle: Secondary | ICD-10-CM | POA: Diagnosis not present

## 2022-09-17 DIAGNOSIS — R799 Abnormal finding of blood chemistry, unspecified: Secondary | ICD-10-CM | POA: Insufficient documentation

## 2022-09-17 DIAGNOSIS — G8929 Other chronic pain: Secondary | ICD-10-CM | POA: Diagnosis not present

## 2022-09-17 LAB — I-STAT BETA HCG BLOOD, ED (MC, WL, AP ONLY): I-stat hCG, quantitative: <5 m[IU]/mL (ref <5)

## 2022-09-17 LAB — I-STAT CHEM 8, ED
BUN: 6 mg/dL (ref 6–20)
Calcium, Ion: 1.21 mmol/L (ref 1.15–1.40)
Chloride: 103 mmol/L (ref 98–111)
Creatinine, Ser: 0.6 mg/dL (ref 0.44–1.00)
Glucose, Bld: 89 mg/dL (ref 70–99)
HCT: 34 % — ABNORMAL LOW (ref 36.0–46.0)
Hemoglobin: 11.6 g/dL — ABNORMAL LOW (ref 12.0–15.0)
Potassium: 3.7 mmol/L (ref 3.5–5.1)
Sodium: 138 mmol/L (ref 135–145)
TCO2: 23 mmol/L (ref 22–32)

## 2022-09-17 LAB — CBG MONITORING, ED: Glucose-Capillary: 99 mg/dL (ref 70–99)

## 2022-09-17 MED ORDER — ACETAMINOPHEN 325 MG PO TABS
650.0000 mg | ORAL_TABLET | Freq: Once | ORAL | Status: AC
  Administered 2022-09-17: 650 mg via ORAL
  Filled 2022-09-17: qty 2

## 2022-09-17 MED ORDER — AMMONIA AROMATIC IN INHA
RESPIRATORY_TRACT | Status: AC
  Filled 2022-09-17: qty 10

## 2022-09-17 MED ORDER — LORAZEPAM 2 MG/ML IJ SOLN
INTRAMUSCULAR | Status: AC
  Administered 2022-09-17: 4 mg
  Filled 2022-09-17: qty 2

## 2022-09-17 MED ORDER — LEVETIRACETAM IN NACL 500 MG/100ML IV SOLN
500.0000 mg | Freq: Once | INTRAVENOUS | Status: AC
  Administered 2022-09-17: 500 mg via INTRAVENOUS
  Filled 2022-09-17: qty 100

## 2022-09-17 NOTE — Discharge Instructions (Addendum)
It was our pleasure to provide your ER care today - we hope that you feel better.  Take your seizure meds as prescribed (keppra 1000 mg bid).   Follow up closely with your neurologist this coming week - call office Monday AM to arrange appointment.  Return to ER if worse, new symptoms, fevers, new/severe pain, recurrent seizures, trouble breathing, or other concern.   No driving, operating heavy machinery, or swimming until cleared to do so by your doctor/neurologist.

## 2022-09-17 NOTE — ED Triage Notes (Signed)
Pt comes via [facility] for seizure, witnessed by coworkers, lasted about 30 seconds, bit her tongue, lowered to floor, post ictal with EMS, now AxO

## 2022-09-17 NOTE — ED Provider Notes (Signed)
Signed out at 0730 that pt with hx sz, and that pt had received ativan and keppra in ED, and to d/c to home when less drowsy.  Pt is awake and alert. No recurrent sz activity noted. Vitals currently normal. Room air pulse ox is 98%.  Pt with small amt blood in spit/saliva. Pt w contusion to lateral edge tongue, tiny amt blood to area. No coughing or increased wob noted. No vomiting. Chest cta. Abd soft nt.   Po fluids/food, ambulate in hall.  Pt currently appears stable for d/c.   Rec neurology f/u this coming week.  Return precautions provided.        Lajean Saver, MD 09/17/22 [phone]

## 2022-09-17 NOTE — ED Provider Notes (Addendum)
[place] [facility] DEPT Provider Note: Georgena Spurling, MD, FACEP  CSN: [id number] MRN: [id number] ARRIVAL: 09/17/22 at [place]: [place]  Seizures   HISTORY OF PRESENT ILLNESS  09/17/22 3:19 AM Michelle Short is a 31 y.o. female with a history of seizures on Keppra.  She had a seizure just prior to arrival at work which was witnessed by coworkers.  It was about 30 seconds in length.  She did bite her tongue.  It is unclear if she fell or was lowered to the floor but did bruise her left maxillary area.  She was initially postictal but is now awake and alert and has had no further seizures while in the ED.  She states her seizures are usually nocturnal and it was unusual for her to have a seizure at work but her last breakthrough seizure was about a month ago.  She states she has been compliant with her Keppra.   Past Medical History:  Diagnosis Date   Common migraine with intractable migraine 09/19/2018   Headache    Marijuana abuse    Seizures ([place]) 12/2017    Past Surgical History:  Procedure Laterality Date   CESAREAN SECTION N/A 06/05/2019   Procedure: CESAREAN SECTION;  Surgeon: Osborne Oman, MD;  Location: [facility] LD ORS;  Service: Obstetrics;  Laterality: N/A;   ESOPHAGOGASTRODUODENOSCOPY (EGD) WITH PROPOFOL N/A 02/15/2021   Procedure: ESOPHAGOGASTRODUODENOSCOPY (EGD) WITH PROPOFOL;  Surgeon: Eloise Harman, DO;  Location: [facility] ENDO SUITE;  Service: Endoscopy;  Laterality: N/A;  PM    Family History  Problem Relation Age of Onset   Hyperlipidemia Mother    Diabetes Paternal Grandfather    Colon cancer Maternal Grandmother        colon cancer   Heart attack Maternal Grandfather    Colon polyps Neg Hx     Social History   Tobacco Use   Smoking status: Never   Smokeless tobacco: Never  Vaping Use   Vaping Use: Never used  Substance Use Topics   Alcohol use: No   Drug use: Yes    Types: Marijuana    Comment: daily    Prior to Admission  medications   Medication Sig Start Date End Date Taking? Authorizing Provider  levETIRAcetam (KEPPRA) 500 MG tablet Take 1 tablet (500 mg total) by mouth 2 (two) times daily. 01/12/22   Suzzanne Cloud, NP    Allergies Ibuprofen and Sulfa antibiotics   REVIEW OF SYSTEMS  Negative except as noted here or in the History of Present Illness.   PHYSICAL EXAMINATION  Initial Vital Signs Blood pressure (!) 113/56, pulse 82, temperature 98 F (36.7 C), temperature source Oral, resp. rate 20, SpO2 95 %.  Examination General: Well-developed, well-nourished female in no acute distress; appearance consistent with age of record HENT: normocephalic; tenderness and mild swelling of left cheek; bite mark left tongue Eyes: pupils equal, round and reactive to light; extraocular muscles intact Neck: supple Heart: regular rate and rhythm Lungs: clear to auscultation bilaterally Abdomen: soft; nondistended; nontender; bowel sounds present Extremities: No deformity; full range of motion; pulses normal Neurologic: Awake, alert and oriented; motor function intact in all extremities and symmetric; no facial droop Skin: Warm and dry Psychiatric: Normal mood and affect   RESULTS  Summary of this visit's results, reviewed and interpreted by myself:   EKG Interpretation  Date/Time:    Ventricular Rate:    PR Interval:    QRS Duration:   QT Interval:  QTC Calculation:   R Axis:     Text Interpretation:         Laboratory Studies: Results for orders placed or performed during the hospital encounter of 09/17/22 (from the past 24 hour(s))  CBG monitoring, ED     Status: None   Collection Time: 09/17/22  2:44 AM  Result Value Ref Range   Glucose-Capillary 99 70 - 99 mg/dL  I-Stat Beta hCG blood, ED ([facility], [facility], [facility] only)     Status: None   Collection Time: 09/17/22  2:51 AM  Result Value Ref Range   I-stat hCG, quantitative <5.0 <5 mIU/mL   Comment 3          I-stat chem 8, ED (not at [facility] or  [facility])     Status: Abnormal   Collection Time: 09/17/22  2:56 AM  Result Value Ref Range   Sodium 138 135 - 145 mmol/L   Potassium 3.7 3.5 - 5.1 mmol/L   Chloride 103 98 - 111 mmol/L   BUN 6 6 - 20 mg/dL   Creatinine, Ser 5.85 0.44 - 1.00 mg/dL   Glucose, Bld 89 70 - 99 mg/dL   Calcium, Ion 2.77 8.24 - 1.40 mmol/L   TCO2 23 22 - 32 mmol/L   Hemoglobin 11.6 (L) 12.0 - 15.0 g/dL   HCT 23.5 (L) 36.1 - 44.3 %   Imaging Studies: No results found.  ED COURSE and MDM  Nursing notes, initial and subsequent vitals signs, including pulse oximetry, reviewed and interpreted by myself.  Vitals:   09/17/22 0430 09/17/22 0445 09/17/22 0515 09/17/22 0530  BP: 116/75 (!) 92/55 (!) 93/55 138/72  Pulse: 67 88 67 (!) 120  Resp: (!) 24 (!) 39 19 (!) 25  Temp:      TempSrc:      SpO2: 97% 97% 96% 92%   Medications  levETIRAcetam (KEPPRA) IVPB 500 mg/100 mL premix (has no administration in time range)  acetaminophen (TYLENOL) tablet 650 mg (650 mg Oral Given 09/17/22 0347)  LORazepam (ATIVAN) 2 MG/ML injection (4 mg  Given 09/17/22 0531)  ammonia inhalant (  Given 09/17/22 0608)   History is consistent with a breakthrough seizure.  She has been compliant with her Keppra.  She does have a neurologist that follows her.  5:31 AM While waiting for ride patient had another generalized seizure lasting about 2 minutes.  She became hypoxic into the 80s and was given supplemental oxygen.  4 mg of IV Ativan were ordered.  Will supplement with 500 mg IV Keppra as well.  7:01 AM Patient sleeping after IV Ativan. Signed out to Dr. Denton Lank.  PROCEDURES  Procedures   ED DIAGNOSES     ICD-10-CM   1. Seizure (HCC)  R56.9     2. Facial contusion, initial encounter  S00.83XA     3. Superficial injury of tongue  S00.502A          Robbert Langlinais, Jonny Ruiz, MD 09/17/22 1540    Paula Libra, MD 09/17/22 0867

## 2022-09-20 ENCOUNTER — Encounter: Payer: Self-pay | Admitting: Neurology

## 2022-09-20 ENCOUNTER — Ambulatory Visit: Payer: Medicaid Other | Admitting: Neurology

## 2022-09-20 VITALS — BP 107/72 | HR 69 | Ht 64.0 in | Wt 112.0 lb

## 2022-09-20 DIAGNOSIS — G40019 Localization-related (focal) (partial) idiopathic epilepsy and epileptic syndromes with seizures of localized onset, intractable, without status epilepticus: Secondary | ICD-10-CM | POA: Diagnosis not present

## 2022-09-20 MED ORDER — LEVETIRACETAM 1000 MG PO TABS: 1000.0000 mg | ORAL_TABLET | Freq: Two times a day (BID) | ORAL | 0 refills | Status: DC

## 2022-09-20 MED ORDER — LAMOTRIGINE ER 50 MG PO TB24: ORAL_TABLET | ORAL | 0 refills | Status: DC

## 2022-09-20 NOTE — Progress Notes (Signed)
PATIENT: Michelle Short DOB: 02-Oct-1991  REASON FOR VISIT: follow up for seizures HISTORY FROM: patient PRIMARY NEUROLOGIST: Dr. Teresa Coombs  HISTORY OF PRESENT ILLNESS: Today 09/20/22 Patient presents today for follow-up, she is accompanied by her mother.  Last visit was in February since then he she had 3 additional seizures starting September.  She reported being compliant with Keppra 500 mg twice daily but there are also periods of nonadherence.  She reported seizure on September 2, another 22 July 2028 and the last one on October 28. With the seizure on Oct 28, she fell and hit her head.    Update 01/12/2022 SS: [name] here today for follow-up with history of headaches and seizures.  Last seizure was in May 2022, went to the ER. In the past admits to not being compliant with Keppra with seizures, at 1 time was supposed to be taking 1000 mg twice daily. Currently taking 500 mg twice daily Keppra no seizure since May 2022. 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 Is compliant with her medications now. Sometime has irritability from Keppra. All seizures have been nocturnal.   Update 12/31/2020 SS: Michelle Short is a 31 year old female with history of migraine headache and seizures. Was in the ER September 07, 2020 for reported seizure.  CT head was negative.  She sent a MyChart message, Keppra was increased to 750 mg twice a day. Was recently in the ER December 21, 2020, reportedly woke up, felt drowsy like she had seizure, went downstairs to get her sister. Then had another seizure, generalized seizure, no oral injury or incontinence. 2 seizures in the ambulance.Reports vomiting with blood, after seizures. For 2 days after she was coughing up blood. She didn't miss any doses of medication. Under more stress, just moved in with grandmother, single parent to 48 month old. Currently on Keppra 1000 mg twice daily. Seeing GI in March, vomiting has gone away. Taking Protonix daily. Mild occasional  burning.  No more seizures.  Lipase, LFT, PT/INR, ETOH, unremarkable. WBC was 14.1, HGB 10.9.  HISTORY 07/23/2020 SS: Michelle Short is a 31 year old female with history of migraine headaches and seizures.  Last nocturnal seizure was in March 2021, she had missed 2 doses of Keppra.  At last visit, was switched to Keppra extended release, but didn't fill the medication for some reason.  She remains on Keppra 500 mg twice a day, reports compliance.  No recurrent seizure.  Indicates she has been doing well, tolerating well.  No new problems or concerns.  She has a 37-year-old son. Presents today for evaluation unaccompanied.    REVIEW OF SYSTEMS: Out of a complete 14 system review of symptoms, the patient complains only of the following symptoms, and all other reviewed systems are negative.  See HPI  ALLERGIES: Allergies  Allergen Reactions   Ibuprofen Other (See Comments)    "interferes with my seizure medication"   Sulfa Antibiotics Rash    HOME MEDICATIONS: Outpatient Medications Prior to Visit  Medication Sig Dispense Refill   levETIRAcetam (KEPPRA) 500 MG tablet Take 1 tablet (500 mg total) by mouth 2 (two) times daily. 180 tablet 3   No facility-administered medications prior to visit.    PAST MEDICAL HISTORY: Past Medical History:  Diagnosis Date   Common migraine with intractable migraine 09/19/2018   Headache    Marijuana abuse    Seizures (HCC) 12/2017    PAST SURGICAL HISTORY: Past Surgical History:  Procedure Laterality Date   CESAREAN SECTION  N/A 06/05/2019   Procedure: CESAREAN SECTION;  Surgeon: Tereso Newcomer, MD;  Location: [facility];  Service: Obstetrics;  Laterality: N/A;   ESOPHAGOGASTRODUODENOSCOPY (EGD) WITH PROPOFOL N/A 02/15/2021   Procedure: ESOPHAGOGASTRODUODENOSCOPY (EGD) WITH PROPOFOL;  Surgeon: Lanelle Bal, DO;  Location: [facility];  Service: Endoscopy;  Laterality: N/A;  PM    FAMILY HISTORY: Family History  Problem Relation Age of Onset    Hyperlipidemia Mother    Diabetes Paternal Grandfather    Colon cancer Maternal Grandmother        colon cancer   Heart attack Maternal Grandfather    Colon polyps Neg Hx     SOCIAL HISTORY: Social History   Socioeconomic History   Marital status: Single    Spouse name: Irving Copas   Number of children: 1   Years of education: 15   Highest education level: Some college, no degree  Occupational History   Not on file  Tobacco Use   Smoking status: Never   Smokeless tobacco: Never  Vaping Use   Vaping Use: Never used  Substance and Sexual Activity   Alcohol use: No   Drug use: Yes    Types: Marijuana    Comment: daily   Sexual activity: Yes    Birth control/protection: Condom, Abstinence  Other Topics Concern   Not on file  Social History Narrative   Not on file   Social Determinants of Health   Financial Resource Strain: Low Risk  (10/31/2018)   Overall Financial Resource Strain (CARDIA)    Difficulty of Paying Living Expenses: Not hard at all  Food Insecurity: No Food Insecurity (10/31/2018)   Hunger Vital Sign    Worried About Running Out of Food in the Last Year: Never true    Ran Out of Food in the Last Year: Never true  Transportation Needs: No Transportation Needs (10/31/2018)   PRAPARE - Administrator, Civil Service (Medical): No    Lack of Transportation (Non-Medical): No  Physical Activity: Insufficiently Active (10/31/2018)   Exercise Vital Sign    Days of Exercise per Week: 3 days    Minutes of Exercise per Session: 40 min  Stress: No Stress Concern Present (10/31/2018)   [employer] of Occupational Health - Occupational Stress Questionnaire    Feeling of Stress : Not at all  Social Connections: Somewhat Isolated (10/31/2018)   Social Connection and Isolation Panel [NHANES]    Frequency of Communication with Friends and Family: More than three times a week    Frequency of Social Gatherings with Friends and Family: More than three  times a week    Attends Religious Services: More than 4 times per year    Active Member of [employer] or Organizations: No    Attends Banker Meetings: Never    Marital Status: Never married  Intimate Partner Violence: Not At Risk (10/31/2018)   Humiliation, Afraid, Rape, and Kick questionnaire    Fear of Current or Ex-Partner: No    Emotionally Abused: No    Physically Abused: No    Sexually Abused: No   PHYSICAL EXAM  Vitals:   09/20/22 0853  BP: 107/72  Pulse: 69  Weight: 112 lb (50.8 kg)  Height: 5\' 4"  (1.626 m)    Body mass index is 19.22 kg/m.  Generalized: Well developed, in no acute distress   Neurological examination  Mentation: Alert oriented to time, place, history taking. Follows all commands speech and language fluent Cranial nerve II-XII: Pupils  were equal round reactive to light. Extraocular movements were full, visual field were full on confrontational test. Facial sensation and strength were normal.  Head turning and shoulder shrug  were normal and symmetric. Motor: The motor testing reveals 5 over 5 strength of all 4 extremities. Good symmetric motor tone is noted throughout.  Sensory: Sensory testing is intact to soft touch on all 4 extremities. No evidence of extinction is noted.  Coordination: Cerebellar testing reveals good finger-nose-finger and heel-to-shin bilaterally.  Gait and station: Gait is normal.  Reflexes: Deep tendon reflexes are symmetric and normal bilaterally.   DIAGNOSTIC DATA (LABS, IMAGING, TESTING) - I reviewed patient records, labs, notes, testing and imaging myself where available.  Lab Results  Component Value Date   WBC 10.3 08/19/2022   HGB 11.6 (L) 09/17/2022   HCT 34.0 (L) 09/17/2022   MCV 90.7 08/19/2022   PLT 224 08/19/2022      Component Value Date/Time   NA 138 09/17/2022 0256   K 3.7 09/17/2022 0256   CL 103 09/17/2022 0256   CO2 29 08/19/2022 0016   GLUCOSE 89 09/17/2022 0256   BUN 6 09/17/2022 0256    CREATININE 0.60 09/17/2022 0256   CALCIUM 8.5 (L) 08/19/2022 0016   PROT 7.2 01/27/2022 0330   ALBUMIN 4.1 01/27/2022 0330   AST 23 01/27/2022 0330   ALT 17 01/27/2022 0330   ALKPHOS 56 01/27/2022 0330   BILITOT 0.4 01/27/2022 0330   GFRNONAA >60 08/19/2022 0016   GFRAA >60 05/24/2020 1616   No results found for: "CHOL", "HDL", "LDLCALC", "LDLDIRECT", "TRIG", "CHOLHDL" No results found for: "HGBA1C" No results found for: "VITAMINB12" No results found for: "TSH"  ASSESSMENT AND PLAN 31 y.o. year old female  has a past medical history of Common migraine with intractable migraine (09/19/2018), Headache, Marijuana abuse, and Seizures ([place]) (12/2017). here with:  1.  Seizures  -Continue to have breakthrough seizures in the setting of medication nonadherence.  She is on Keppra 500 mg twice daily and does report side effect of mood swings, irritability and also period of medication nonadherence. We will attempt to switch patient from levetiracetam to lamotrigine.  I will also try to keep her on extended release version to reduce nonadherence. -Continue with Keppra 1000 mg twice daily, lamotrigine XR 50 mg started with goal to reach 200 mg in 8 weeks -EEG in Feb 2019 showed occasional focal slowing over left temporal region -MRI brain with and without contrast, MRV was normal in Feb 2019 - Discuss driving restriction for the next 6 months  - Follow up in 6 weeks, at that time, we will check a Lamotrigine level and if will attempt to come off medication.Alric Ran, MD 09/20/2022, 8:57 AM [facility] [address] [place], [place] [address] [phone]

## 2022-09-20 NOTE — Addendum Note (Signed)
Addended byAlric Ran on: 09/20/2022 09:32 AM   Modules accepted: Level of Service

## 2022-09-20 NOTE — Patient Instructions (Addendum)
Continue with Keppra 1000 mg twice daily Start lamotrigine XR 50 mg daily for 2 weeks Then increase to 100 mg daily for 2 weeks Then increase to 150 mg daily for 2 weeks Then increase to 200 mg daily for 2 weeks Follow-up in 6 weeks, at that time we will obtain a lamotrigine level and if no side effect of rash, we will plan to decrease and discontinue Keppra.

## 2022-11-01 ENCOUNTER — Emergency Department (HOSPITAL_COMMUNITY)
Admission: EM | Admit: 2022-11-01 | Discharge: 2022-11-02 | Disposition: A | Discharge status: Home / Self Care | Payer: Medicaid Other | Attending: Emergency Medicine | Admitting: Emergency Medicine

## 2022-11-01 ENCOUNTER — Encounter: Payer: Self-pay | Admitting: Neurology

## 2022-11-01 ENCOUNTER — Ambulatory Visit (INDEPENDENT_AMBULATORY_CARE_PROVIDER_SITE_OTHER): Payer: Medicaid Other | Admitting: Neurology

## 2022-11-01 ENCOUNTER — Other Ambulatory Visit: Payer: Self-pay

## 2022-11-01 ENCOUNTER — Emergency Department (HOSPITAL_COMMUNITY): Admission: EM | Admit: 2022-11-01 | Discharge: 2022-11-01 | Discharge status: Absent without leave | Payer: Self-pay

## 2022-11-01 VITALS — BP 113/67 | HR 78 | Ht 64.0 in | Wt 110.0 lb

## 2022-11-01 DIAGNOSIS — R569 Unspecified convulsions: Secondary | ICD-10-CM | POA: Insufficient documentation

## 2022-11-01 DIAGNOSIS — G40019 Localization-related (focal) (partial) idiopathic epilepsy and epileptic syndromes with seizures of localized onset, intractable, without status epilepticus: Secondary | ICD-10-CM

## 2022-11-01 DIAGNOSIS — R9431 Abnormal electrocardiogram [ECG] [EKG]: Secondary | ICD-10-CM | POA: Diagnosis not present

## 2022-11-01 DIAGNOSIS — Z5181 Encounter for therapeutic drug level monitoring: Secondary | ICD-10-CM | POA: Diagnosis not present

## 2022-11-01 DIAGNOSIS — R404 Transient alteration of awareness: Secondary | ICD-10-CM | POA: Diagnosis not present

## 2022-11-01 DIAGNOSIS — R4182 Altered mental status, unspecified: Secondary | ICD-10-CM | POA: Diagnosis not present

## 2022-11-01 LAB — URINALYSIS, ROUTINE W REFLEX MICROSCOPIC
Bilirubin Urine: NEGATIVE
Glucose, UA: NEGATIVE mg/dL
Hgb urine dipstick: NEGATIVE
Ketones, ur: 5 mg/dL — AB
Leukocytes,Ua: NEGATIVE
Nitrite: NEGATIVE
Protein, ur: 30 mg/dL — AB
Specific Gravity, Urine: 1.015 (ref 1.005–1.030)
pH: 5 (ref 5.0–8.0)

## 2022-11-01 LAB — CBC WITH DIFFERENTIAL/PLATELET
Abs Immature Granulocytes: 0.06 10*3/uL (ref 0.00–0.07)
Basophils Absolute: 0 10*3/uL (ref 0.0–0.1)
Basophils Relative: 0 %
Eosinophils Absolute: 0 10*3/uL (ref 0.0–0.5)
Eosinophils Relative: 0 %
HCT: 35.7 % — ABNORMAL LOW (ref 36.0–46.0)
Hemoglobin: 11.2 g/dL — ABNORMAL LOW (ref 12.0–15.0)
Immature Granulocytes: 1 %
Lymphocytes Relative: 13 %
Lymphs Abs: 1.6 10*3/uL (ref 0.7–4.0)
MCH: 27.9 pg (ref 26.0–34.0)
MCHC: 31.4 g/dL (ref 30.0–36.0)
MCV: 89 fL (ref 80.0–100.0)
Monocytes Absolute: 0.6 10*3/uL (ref 0.1–1.0)
Monocytes Relative: 5 %
Neutro Abs: 10.6 10*3/uL — ABNORMAL HIGH (ref 1.7–7.7)
Neutrophils Relative %: 81 %
Platelets: 309 10*3/uL (ref 150–400)
RBC: 4.01 MIL/uL (ref 3.87–5.11)
RDW: 13.5 % (ref 11.5–15.5)
WBC: 12.9 10*3/uL — ABNORMAL HIGH (ref 4.0–10.5)
nRBC: 0 % (ref 0.0–0.2)

## 2022-11-01 LAB — COMPREHENSIVE METABOLIC PANEL
ALT: 18 U/L (ref 0–44)
AST: 36 U/L (ref 15–41)
Albumin: 4.2 g/dL (ref 3.5–5.0)
Alkaline Phosphatase: 65 U/L (ref 38–126)
Anion gap: 17 — ABNORMAL HIGH (ref 5–15)
BUN: 8 mg/dL (ref 6–20)
CO2: 15 mmol/L — ABNORMAL LOW (ref 22–32)
Calcium: 8.5 mg/dL — ABNORMAL LOW (ref 8.9–10.3)
Chloride: 103 mmol/L (ref 98–111)
Creatinine, Ser: 0.92 mg/dL (ref 0.44–1.00)
GFR, Estimated: >60 mL/min (ref >60)
Glucose, Bld: 132 mg/dL — ABNORMAL HIGH (ref 70–99)
Potassium: 3.3 mmol/L — ABNORMAL LOW (ref 3.5–5.1)
Sodium: 135 mmol/L (ref 135–145)
Total Bilirubin: 0.6 mg/dL (ref 0.3–1.2)
Total Protein: 7.3 g/dL (ref 6.5–8.1)

## 2022-11-01 LAB — POC URINE PREG, ED: Preg Test, Ur: NEGATIVE

## 2022-11-01 LAB — CBG MONITORING, ED: Glucose-Capillary: 116 mg/dL — ABNORMAL HIGH (ref 70–99)

## 2022-11-01 MED ORDER — LORAZEPAM 2 MG/ML IJ SOLN
INTRAMUSCULAR | Status: AC
  Filled 2022-11-01: qty 2

## 2022-11-01 MED ORDER — LEVETIRACETAM IN NACL 1000 MG/100ML IV SOLN
INTRAVENOUS | Status: AC
  Filled 2022-11-01: qty 200

## 2022-11-01 MED ORDER — SODIUM CHLORIDE 0.9 % IV BOLUS
1000.0000 mL | Freq: Once | INTRAVENOUS | Status: AC
  Administered 2022-11-01: 1000 mL via INTRAVENOUS

## 2022-11-01 MED ORDER — LORAZEPAM 2 MG/ML IJ SOLN
1.0000 mg | Freq: Once | INTRAMUSCULAR | Status: AC
  Administered 2022-11-02: 1 mg via INTRAVENOUS

## 2022-11-01 MED ORDER — LAMOTRIGINE ER 200 MG PO TB24: 200.0000 mg | ORAL_TABLET | Freq: Every day | ORAL | 11 refills | Status: DC

## 2022-11-01 MED ORDER — SODIUM CHLORIDE 0.9 % IV SOLN
2000.0000 mg | Freq: Once | INTRAVENOUS | Status: AC
  Administered 2022-11-02: 2000 mg via INTRAVENOUS
  Filled 2022-11-01: qty 20

## 2022-11-01 NOTE — ED Provider Notes (Signed)
[facility] Emergency Department Provider Note MRN:  [id number]  Arrival date & time: 11/02/22     Chief Complaint   Seizures   History of Present Illness   Michelle Short is a 31 y.o. year-old female with a history of seizure disorder presenting to the ED with chief complaint of seizures.  Per history from mother, patient called her mom earlier today and told her that she was not feeling well.  Mom went to check on her and she seemed to be postictal based on her experience.  Sleepy, evidence of biting of her upper lip which is common for her.  Has recently been changed from Keppra to lamotrigine, mom is confident that patient is not fully compliant with medications.  Review of Systems  A thorough review of systems was obtained and all systems are negative except as noted in the HPI and PMH.   Patient's Health History    Past Medical History:  Diagnosis Date   Common migraine with intractable migraine 09/19/2018   Headache    Marijuana abuse    Seizures (HCC) 12/2017    Past Surgical History:  Procedure Laterality Date   CESAREAN SECTION N/A 06/05/2019   Procedure: CESAREAN SECTION;  Surgeon: Tereso Newcomer, MD;  Location: [facility];  Service: Obstetrics;  Laterality: N/A;   ESOPHAGOGASTRODUODENOSCOPY (EGD) WITH PROPOFOL N/A 02/15/2021   Procedure: ESOPHAGOGASTRODUODENOSCOPY (EGD) WITH PROPOFOL;  Surgeon: Lanelle Bal, DO;  Location[facility];  Service: Endoscopy;  Laterality: N/A;  PM    Family History  Problem Relation Age of Onset   Hyperlipidemia Mother    Diabetes Paternal Grandfather    Colon cancer Maternal Grandmother        colon cancer   Heart attack Maternal Grandfather    Colon polyps Neg Hx     Social History   Socioeconomic History   Marital status: Single    Spouse name: Irving Copas   Number of children: 1   Years of education: 15   Highest education level: Some college, no degree  Occupational History   Not on  file  Tobacco Use   Smoking status: Never   Smokeless tobacco: Never  Vaping Use   Vaping Use: Never used  Substance and Sexual Activity   Alcohol use: No   Drug use: Yes    Types: Marijuana    Comment: daily   Sexual activity: Yes    Birth control/protection: Condom, Abstinence  Other Topics Concern   Not on file  Social History Narrative   Not on file   Social Determinants of Health   Financial Resource Strain: Low Risk  (10/31/2018)   Overall Financial Resource Strain (CARDIA)    Difficulty of Paying Living Expenses: Not hard at all  Food Insecurity: No Food Insecurity (10/31/2018)   Hunger Vital Sign    Worried About Running Out of Food in the Last Year: Never true    Ran Out of Food in the Last Year: Never true  Transportation Needs: No Transportation Needs (10/31/2018)   PRAPARE - Administrator, Civil Service (Medical): No    Lack of Transportation (Non-Medical): No  Physical Activity: Insufficiently Active (10/31/2018)   Exercise Vital Sign    Days of Exercise per Week: 3 days    Minutes of Exercise per Session: 40 min  Stress: No Stress Concern Present (10/31/2018)   [employer] of Occupational Health - Occupational Stress Questionnaire    Feeling of Stress : Not  at all  Social Connections: Somewhat Isolated (10/31/2018)   Social Connection and Isolation Panel [NHANES]    Frequency of Communication with Friends and Family: More than three times a week    Frequency of Social Gatherings with Friends and Family: More than three times a week    Attends Religious Services: More than 4 times per year    Active Member of [employer] or Organizations: No    Attends Banker Meetings: Never    Marital Status: Never married  Intimate Partner Violence: Not At Risk (10/31/2018)   Humiliation, Afraid, Rape, and Kick questionnaire    Fear of Current or Ex-Partner: No    Emotionally Abused: No    Physically Abused: No    Sexually Abused: No      Physical Exam   Vitals:   11/02/22 0300 11/02/22 0330  BP: 115/79 (!) 104/57  Pulse: 85 80  Resp: (!) 22 (!) 24  Temp:    SpO2: 99% 100%    CONSTITUTIONAL: Well-appearing, NAD NEURO/PSYCH: Somnolent, opens eyes, otherwise nonparticipatory EYES:  eyes equal and reactive ENT/NECK:  no LAD, no JVD CARDIO: Regular rate, well-perfused, normal S1 and S2 PULM:  CTAB no wheezing or rhonchi GI/GU:  non-distended, non-tender MSK/SPINE:  No gross deformities, no edema SKIN:  no rash, atraumatic   *Additional and/or pertinent findings included in MDM below  Diagnostic and Interventional Summary    EKG Interpretation  Date/Time:  Tuesday November 01 2022 22:17:14 EST Ventricular Rate:  97 PR Interval:  137 QRS Duration: 105 QT Interval:  375 QTC Calculation: 477 R Axis:   57 Text Interpretation: Sinus rhythm Abnormal Q suggests anterior infarct Borderline T abnormalities, anterior leads Borderline prolonged QT interval Confirmed by Kennis Carina [phone]) on 11/01/2022 11:39:29 PM       Labs Reviewed  COMPREHENSIVE METABOLIC PANEL - Abnormal; Notable for the following components:      Result Value   Potassium 3.3 (*)    CO2 15 (*)    Glucose, Bld 132 (*)    Calcium 8.5 (*)    Anion gap 17 (*)    All other components within normal limits  CBC WITH DIFFERENTIAL/PLATELET - Abnormal; Notable for the following components:   WBC 12.9 (*)    Hemoglobin 11.2 (*)    HCT 35.7 (*)    Neutro Abs 10.6 (*)    All other components within normal limits  URINALYSIS, ROUTINE W REFLEX MICROSCOPIC - Abnormal; Notable for the following components:   Ketones, ur 5 (*)    Protein, ur 30 (*)    Bacteria, UA RARE (*)    All other components within normal limits  CBG MONITORING, ED - Abnormal; Notable for the following components:   Glucose-Capillary 116 (*)    All other components within normal limits  LAMOTRIGINE LEVEL  LEVETIRACETAM LEVEL  POC URINE PREG, ED    CT HEAD WO CONTRAST  ( )  Final Result      Medications  LORazepam (ATIVAN) 2 MG/ML injection (  Not Given 11/02/22 0040)  levETIRAcetam (KEPPRA) 1000 MG/100ML IVPB (  Not Given 11/02/22 0039)  sodium chloride 0.9 % bolus 1,000 mL (0 mLs Intravenous Stopped 11/02/22 0040)  LORazepam (ATIVAN) injection 1 mg (1 mg Intravenous Given 11/02/22 0001)  levETIRAcetam (KEPPRA) 2,000 mg in sodium chloride 0.9 % 250 mL IVPB (0 mg Intravenous Stopped 11/02/22 0040)     Procedures  /  Critical Care Procedures  ED Course and Medical Decision Making  Initial Impression and  Ddx History and exam seems consistent with a seizure followed by postictal state.  Bit of a soft blood pressure on my exam, still pretty sleepy, labs thus far revealing an acidosis, likely elevated lactate in the setting of recent seizure.  Providing fluids and will continue to monitor.  Past medical/surgical history that increases complexity of ED encounter: Seizure disorder  Interpretation of Diagnostics I personally reviewed the EKG and my interpretation is as follows: Sinus rhythm  Labs reassuring with no significant blood count or electrolyte disturbance.  Patient Reassessment and Ultimate Disposition/Management     Patient had a seizure-like event here in the emergency department.  This occurred prior to her returning to her baseline.  The seizure-like activity lasted about 1 minute and then resolved.  Provided with 1 mg Ativan, loaded with Keppra.  CT head without acute process.  Case discussed with neurology.  Patient is now at her baseline according to mom, who is comfortable bringing her home.  Given this and the suspicion for noncompliance, seems appropriate to have her go home and follow-up closely.  Strongly encouraged to take her medicines.  Patient management required discussion with the following services or consulting groups:  Neurology  Complexity of Problems Addressed Acute illness or injury that poses threat of life of bodily  function  Additional Data Reviewed and Analyzed Further history obtained from: Prior labs/imaging results  Additional Factors Impacting ED Encounter Risk None  Elmer Sow. Pilar Plate, MD [facility] Emergency Medicine [facility] [email or web address]  Final Clinical Impressions(s) / ED Diagnoses     ICD-10-CM   1. Seizure (HCC)  R56.9       ED Discharge Orders     None        Discharge Instructions Discussed with and Provided to Patient:     Discharge Instructions      You were evaluated in the Emergency Department and after careful evaluation, we did not find any emergent condition requiring admission or further testing in the hospital.  Your exam/testing today was overall reassuring.  Recommend continuing the lamotrigine medication as prescribed and following up closely with neurology.  Please return to the Emergency Department if you experience any worsening of your condition.  Thank you for allowing [place] to be a part of your care.        Sabas Sous, MD 11/02/22 [phone]

## 2022-11-01 NOTE — ED Triage Notes (Signed)
Pt BIB [facility] c/o seizures. Per EMS pt has hx of seizures and had recent medication change. Pt is  postictal in triage.   CBG-141 Versed 4mg  given by EMS in route

## 2022-11-01 NOTE — Progress Notes (Signed)
PATIENT: Michelle Short DOB: 1991/11/10  REASON FOR VISIT: follow up for seizures HISTORY FROM: patient PRIMARY NEUROLOGIST: Dr. Teresa Coombs  HISTORY OF PRESENT ILLNESS: Today 11/01/22 Michelle Short today for follow-up, last visit was in October 31.  At that time we had planned to start her on lamotrigine XR up to 200 mg daily.  She has started the medication, denies any side effect from the medication and actually taking 100 mg XR twice daily.  She reports there are still days where she is unsure if she took the medication or not.  The initial plan was to take it once a day.  In terms of the Keppra, she finished the prescription a week ago and has not renewed it.  She has not reported any additional seizures Overall feel denies any new side effect from the lamotrigine.   INTERVAL HISTORY 09/20/2022:  Patient Short today for follow-up, she is accompanied by her mother.  Last visit was in February since then he she had 3 additional seizures starting September.  She reported being compliant with Keppra 500 mg twice daily but there are also periods of nonadherence.  She reported seizure on September 2, another 22 July 2028 and the last one on October 28. With the seizure on Oct 28, she fell and hit her head.    Update 01/12/2022 SS: Michelle Short today for follow-up with history of headaches and seizures.  Last seizure was in May 2022, went to the ER. In the past admits to not being compliant with Keppra with seizures, at 1 time was supposed to be taking 1000 mg twice daily. Currently taking 500 mg twice daily Keppra no seizure since May 2022. 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 Is compliant with her medications now. Sometime has irritability from Keppra. All seizures have been nocturnal.   Update 12/31/2020 SS: Michelle Short is a 31 year old female with history of migraine headache and seizures. Was in the ER September 07, 2020 for reported seizure.  CT head was negative.  She sent a  MyChart message, Keppra was increased to 750 mg twice a day. Was recently in the ER December 21, 2020, reportedly woke up, felt drowsy like she had seizure, went downstairs to get her sister. Then had another seizure, generalized seizure, no oral injury or incontinence. 2 seizures in the ambulance.Reports vomiting with blood, after seizures. For 2 days after she was coughing up blood. She didn't miss any doses of medication. Under more stress, just moved in with grandmother, single parent to 86 month old. Currently on Keppra 1000 mg twice daily. Seeing GI in March, vomiting has gone away. Taking Protonix daily. Mild occasional burning.  No more seizures.  Lipase, LFT, PT/INR, ETOH, unremarkable. WBC was 14.1, HGB 10.9.  HISTORY 07/23/2020 SS: Michelle Short is a 31 year old female with history of migraine headaches and seizures.  Last nocturnal seizure was in March 2021, she had missed 2 doses of Keppra.  At last visit, was switched to Keppra extended release, but didn't fill the medication for some reason.  She remains on Keppra 500 mg twice a day, reports compliance.  No recurrent seizure.  Indicates she has been doing well, tolerating well.  No new problems or concerns.  She has a 84-year-old son. Short today for evaluation unaccompanied.    REVIEW OF SYSTEMS: Out of a complete 14 system review of symptoms, the patient complains only of the following symptoms, and all other reviewed systems are negative.  See HPI  ALLERGIES: Allergies  Allergen Reactions   Ibuprofen Other (See Comments)    "interferes with my seizure medication"   Sulfa Antibiotics Rash    HOME MEDICATIONS: Outpatient Medications Prior to Visit  Medication Sig Dispense Refill   lamoTRIgine (LAMICTAL XR) 50 MG 24 hour tablet Take 1 tablet (50 mg total) by mouth daily for 14 days, THEN 2 tablets (100 mg total) daily for 14 days, THEN 3 tablets (150 mg total) daily for 14 days, THEN 4 tablets (200 mg total) daily for 14 days. 140  tablet 0   levETIRAcetam (KEPPRA) 1000 MG tablet Take 1 tablet (1,000 mg total) by mouth 2 (two) times daily. (Patient not taking: Reported on 11/01/2022) 180 tablet 0   No facility-administered medications prior to visit.    PAST MEDICAL HISTORY: Past Medical History:  Diagnosis Date   Common migraine with intractable migraine 09/19/2018   Headache    Marijuana abuse    Seizures (HCC) 12/2017    PAST SURGICAL HISTORY: Past Surgical History:  Procedure Laterality Date   CESAREAN SECTION N/A 06/05/2019   Procedure: CESAREAN SECTION;  Surgeon: Tereso Newcomer, MD;  Location: [facility];  Service: Obstetrics;  Laterality: N/A;   ESOPHAGOGASTRODUODENOSCOPY (EGD) WITH PROPOFOL N/A 02/15/2021   Procedure: ESOPHAGOGASTRODUODENOSCOPY (EGD) WITH PROPOFOL;  Surgeon: Lanelle Bal, DO;  Location: [facility];  Service: Endoscopy;  Laterality: N/A;  PM    FAMILY HISTORY: Family History  Problem Relation Age of Onset   Hyperlipidemia Mother    Diabetes Paternal Grandfather    Colon cancer Maternal Grandmother        colon cancer   Heart attack Maternal Grandfather    Colon polyps Neg Hx     SOCIAL HISTORY: Social History   Socioeconomic History   Marital status: Single    Spouse name: Irving Copas   Number of children: 1   Years of education: 15   Highest education level: Some college, no degree  Occupational History   Not on file  Tobacco Use   Smoking status: Never   Smokeless tobacco: Never  Vaping Use   Vaping Use: Never used  Substance and Sexual Activity   Alcohol use: No   Drug use: Yes    Types: Marijuana    Comment: daily   Sexual activity: Yes    Birth control/protection: Condom, Abstinence  Other Topics Concern   Not on file  Social History Narrative   Not on file   Social Determinants of Health   Financial Resource Strain: Low Risk  (10/31/2018)   Overall Financial Resource Strain (CARDIA)    Difficulty of Paying Living Expenses: Not hard at all   Food Insecurity: No Food Insecurity (10/31/2018)   Hunger Vital Sign    Worried About Running Out of Food in the Last Year: Never true    Ran Out of Food in the Last Year: Never true  Transportation Needs: No Transportation Needs (10/31/2018)   PRAPARE - Administrator, Civil Service (Medical): No    Lack of Transportation (Non-Medical): No  Physical Activity: Insufficiently Active (10/31/2018)   Exercise Vital Sign    Days of Exercise per Week: 3 days    Minutes of Exercise per Session: 40 min  Stress: No Stress Concern Present (10/31/2018)   [employer] of Occupational Health - Occupational Stress Questionnaire    Feeling of Stress : Not at all  Social Connections: Somewhat Isolated (10/31/2018)   Social Connection and Isolation Panel [NHANES]  Frequency of Communication with Friends and Family: More than three times a week    Frequency of Social Gatherings with Friends and Family: More than three times a week    Attends Religious Services: More than 4 times per year    Active Member of [employer] or Organizations: No    Attends Banker Meetings: Never    Marital Status: Never married  Intimate Partner Violence: Not At Risk (10/31/2018)   Humiliation, Afraid, Rape, and Kick questionnaire    Fear of Current or Ex-Partner: No    Emotionally Abused: No    Physically Abused: No    Sexually Abused: No   PHYSICAL EXAM  Vitals:   11/01/22 0858  BP: 113/67  Pulse: 78  Weight: 110 lb (49.9 kg)  Height: 5\' 4"  (1.626 m)    Body mass index is 18.88 kg/m.  Generalized: Well developed, in no acute distress   Neurological examination  Mentation: Alert oriented to time, place, history taking. Follows all commands speech and language fluent Cranial nerve II-XII: Pupils were equal round reactive to light. Extraocular movements were full, visual field were full on confrontational test. Facial sensation and strength were normal.  Head turning and shoulder  shrug  were normal and symmetric. Motor: The motor testing reveals 5 over 5 strength of all 4 extremities. Good symmetric motor tone is noted throughout.  Sensory: Sensory testing is intact to soft touch on all 4 extremities. No evidence of extinction is noted.  Coordination: Cerebellar testing reveals good finger-nose-finger and heel-to-shin bilaterally.  Gait and station: Gait is normal.  Reflexes: Deep tendon reflexes are symmetric and normal bilaterally.   DIAGNOSTIC DATA (LABS, IMAGING, TESTING) - I reviewed patient records, labs, notes, testing and imaging myself where available.  Lab Results  Component Value Date   WBC 10.3 08/19/2022   HGB 11.6 (L) 09/17/2022   HCT 34.0 (L) 09/17/2022   MCV 90.7 08/19/2022   PLT 224 08/19/2022      Component Value Date/Time   NA 138 09/17/2022 0256   K 3.7 09/17/2022 0256   CL 103 09/17/2022 0256   CO2 29 08/19/2022 0016   GLUCOSE 89 09/17/2022 0256   BUN 6 09/17/2022 0256   CREATININE 0.60 09/17/2022 0256   CALCIUM 8.5 (L) 08/19/2022 0016   PROT 7.2 01/27/2022 0330   ALBUMIN 4.1 01/27/2022 0330   AST 23 01/27/2022 0330   ALT 17 01/27/2022 0330   ALKPHOS 56 01/27/2022 0330   BILITOT 0.4 01/27/2022 0330   GFRNONAA >60 08/19/2022 0016   GFRAA >60 05/24/2020 1616   No results found for: "CHOL", "HDL", "LDLCALC", "LDLDIRECT", "TRIG", "CHOLHDL" No results found for: "HGBA1C" No results found for: "VITAMINB12" No results found for: "TSH"  ASSESSMENT AND PLAN 31 y.o. year old female  has a past medical history of Common migraine with intractable migraine (09/19/2018), Headache, Marijuana abuse, and Seizures (HCC) (12/2017). Short with:  1. Partial idiopathic epilepsy with seizures of localized onset, intractable, without status epilepticus (HCC)   2. Therapeutic drug monitoring      -Continue with Lamotrigine 200 mg XR daily -Will check a Lamotrigine level today with BMP.  -Ok to discontinue Keppra  -Follow up in 3 months or sooner  if worse.     01/2018, MD 11/01/2022, 9:03 AM [facility] [address], [address] [place], [place] [phone]

## 2022-11-02 ENCOUNTER — Emergency Department (HOSPITAL_COMMUNITY): Payer: Medicaid Other

## 2022-11-02 ENCOUNTER — Telehealth: Payer: Self-pay

## 2022-11-02 DIAGNOSIS — R4182 Altered mental status, unspecified: Secondary | ICD-10-CM | POA: Diagnosis not present

## 2022-11-02 DIAGNOSIS — R569 Unspecified convulsions: Secondary | ICD-10-CM | POA: Diagnosis not present

## 2022-11-02 LAB — BASIC METABOLIC PANEL
BUN/Creatinine Ratio: 8 — ABNORMAL LOW (ref 9–23)
BUN: 7 mg/dL (ref 6–20)
CO2: 25 mmol/L (ref 20–29)
Calcium: 9.3 mg/dL (ref 8.7–10.2)
Chloride: 104 mmol/L (ref 96–106)
Creatinine, Ser: 0.87 mg/dL (ref 0.57–1.00)
Glucose: 83 mg/dL (ref 70–99)
Potassium: 4.1 mmol/L (ref 3.5–5.2)
Sodium: 141 mmol/L (ref 134–144)
eGFR: 92 mL/min/{1.73_m2} (ref >59)

## 2022-11-02 LAB — LAMOTRIGINE LEVEL: Lamotrigine Lvl: <1 ug/mL — ABNORMAL LOW (ref 2.0–20.0)

## 2022-11-02 NOTE — Patient Outreach (Signed)
Transition Care Management Follow-up Telephone Call Date of discharge and from where: 11/01/22 Michelle Short How have you been since you were released from the hospital? Still don't feel good Any questions or concerns? Yes  Items Reviewed: Did the pt receive and understand the discharge instructions provided? Yes  Medications obtained and verified? No  Other? No  Any new allergies since your discharge? No  Dietary orders reviewed? No Do you have support at home? Yes   Home Care and Equipment/Supplies: Were home health services ordered? not applicable If so, what is the name of the agency?   Has the agency set up a time to come to the patient's home? not applicable Were any new equipment or medical supplies ordered?  No What is the name of the medical supply agency?  Were you able to get the supplies/equipment? not applicable Do you have any questions related to the use of the equipment or supplies? No  Functional Questionnaire: (I = Independent and D = Dependent) ADLs: i  Bathing/Dressing- i  Meal Prep- i  Eating- I  Maintaining continence- I  Transferring/Ambulation- I  Managing Meds- I  Follow up appointments reviewed:  PCP Hospital f/u appt confirmed? No  Scheduled to see  on @ . Specialist Hospital f/u appt confirmed?  Scheduled to see  on  @ . Are transportation arrangements needed? No  If their condition worsens, is the pt aware to call PCP or go to the Emergency Dept.? Yes Was the patient provided with contact information for the PCP's office or ED? Yes Was to pt encouraged to call back with questions or concerns? Yes Gus Puma, BSW, [facility]  [facility]  High Risk Managed Medicaid Team  [phone]

## 2022-11-02 NOTE — Discharge Instructions (Signed)
You were evaluated in the Emergency Department and after careful evaluation, we did not find any emergent condition requiring admission or further testing in the hospital.  Your exam/testing today was overall reassuring.  Recommend continuing the lamotrigine medication as prescribed and following up closely with neurology.  Please return to the Emergency Department if you experience any worsening of your condition.  Thank you for allowing [place] to be a part of your care.

## 2022-11-04 LAB — LAMOTRIGINE LEVEL: Lamotrigine Lvl: <1 ug/mL — ABNORMAL LOW (ref 2.0–20.0)

## 2022-11-04 LAB — LEVETIRACETAM LEVEL: Levetiracetam Lvl: <2 ug/mL — ABNORMAL LOW (ref 10.0–40.0)

## 2022-12-01 ENCOUNTER — Other Ambulatory Visit: Payer: Self-pay | Admitting: Neurology

## 2022-12-09 ENCOUNTER — Other Ambulatory Visit: Payer: Self-pay | Admitting: Neurology

## 2022-12-09 ENCOUNTER — Encounter: Payer: Self-pay | Admitting: Neurology

## 2022-12-12 ENCOUNTER — Other Ambulatory Visit: Payer: Self-pay | Admitting: Neurology

## 2022-12-12 MED ORDER — LAMOTRIGINE 25 MG PO TABS: ORAL_TABLET | ORAL | 0 refills | Status: DC

## 2022-12-12 MED ORDER — LEVETIRACETAM 1000 MG PO TABS: 1000.0000 mg | ORAL_TABLET | Freq: Two times a day (BID) | ORAL | 0 refills | Status: DC

## 2023-01-05 ENCOUNTER — Other Ambulatory Visit: Payer: Self-pay | Admitting: Neurology

## 2023-02-13 ENCOUNTER — Telehealth: Payer: Self-pay | Admitting: Neurology

## 2023-02-13 ENCOUNTER — Other Ambulatory Visit: Payer: Self-pay | Admitting: Neurology

## 2023-02-13 MED ORDER — LAMOTRIGINE 25 MG PO TABS: ORAL_TABLET | ORAL | 0 refills | Status: DC

## 2023-02-13 NOTE — Addendum Note (Signed)
Addended by: Shirlee Limerick on: 02/13/2023 03:38 PM   Modules accepted: Orders

## 2023-02-13 NOTE — Telephone Encounter (Signed)
Pt states she is down to about 5 pills lamoTRIgine (LAMICTAL) 25 MG tablet , she has been taking less than her prescribed daily amount, pt is asking if enough medication can be called in to get her to her appointment date

## 2023-02-13 NOTE — Telephone Encounter (Signed)
Pt called. Stated she need to talk to nurse about medication refill.

## 2023-02-13 NOTE — Telephone Encounter (Signed)
Called and spoke to pt who stated that she is coming for an appt in 2 days and is about to run out of the medication and needs a refill. I set in a months supply

## 2023-02-13 NOTE — Telephone Encounter (Signed)
Pt returned call and stated that she only wants a 2 day supply so I cancelled previous order and sent for 2 day supply and the pt voiced gratitude and understanding.

## 2023-02-15 ENCOUNTER — Encounter: Payer: Self-pay | Admitting: Neurology

## 2023-02-15 ENCOUNTER — Ambulatory Visit (INDEPENDENT_AMBULATORY_CARE_PROVIDER_SITE_OTHER): Payer: Medicaid Other | Admitting: Neurology

## 2023-02-15 VITALS — BP 128/91 | HR 102 | Ht 64.0 in | Wt 106.0 lb

## 2023-02-15 DIAGNOSIS — G40019 Localization-related (focal) (partial) idiopathic epilepsy and epileptic syndromes with seizures of localized onset, intractable, without status epilepticus: Secondary | ICD-10-CM | POA: Diagnosis not present

## 2023-02-15 DIAGNOSIS — Z5181 Encounter for therapeutic drug level monitoring: Secondary | ICD-10-CM

## 2023-02-15 MED ORDER — LAMOTRIGINE 100 MG PO TABS: 100.0000 mg | ORAL_TABLET | Freq: Two times a day (BID) | ORAL | 3 refills | Status: DC

## 2023-02-15 NOTE — Progress Notes (Signed)
PATIENT: Michelle Short DOB: 04/21/1991  REASON FOR VISIT: follow up for seizures HISTORY FROM: patient PRIMARY NEUROLOGIST: Dr. April Manson  HISTORY OF PRESENT ILLNESS: Today 02/15/23 Michelle Short presented for follow-up, last visit was in December, at that time we planned to switch her from [place] to lamotrigine.  She reported having 1 seizure in December and another on in on January 11, does not recall any precipitating factor. During this time, she was not able to afford the extended release and was not taking any medications. Her last seizure was Jan 11.  Since then, we put her Lamotrigine IR 100 mg BID. She tolerated lamotrigine very well and no side effects.  Her mood is much better today.  No other complaints.   INTERVAL HISTORY 11/01/2022 Michelle Short presents today for follow-up, last visit was in October 31.  At that time we had planned to start her on lamotrigine XR up to 200 mg daily.  She has started the medication, denies any side effect from the medication and actually taking 100 mg XR twice daily.  She reports there are still days where she is unsure if she took the medication or not.  The initial plan was to take it once a day.  In terms of the Keppra, she finished the prescription a week ago and has not renewed it.  She has not reported any additional seizures Overall feel denies any new side effect from the lamotrigine.   INTERVAL HISTORY 09/20/2022:  Patient presents today for follow-up, she is accompanied by her mother.  Last visit was in February since then he she had 3 additional seizures starting September.  She reported being compliant with Keppra 500 mg twice daily but there are also periods of nonadherence.  She reported seizure on September 2, another 22 July 2028 and the last one on October 28. With the seizure on Oct 28, she fell and hit her head.    Update 01/12/2022 SS: Vermelle here today for follow-up with history of headaches and seizures.  Last seizure was in May 2022,  went to the ER. In the past admits to not being compliant with Keppra with seizures, at 1 time was supposed to be taking 1000 mg twice daily. Currently taking 500 mg twice daily Keppra no seizure since May 2022. 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 Is compliant with her medications now. Sometime has irritability from Michelle Short. All seizures have been nocturnal.   Update 12/31/2020 SS: Michelle Short is a 32 year old female with history of migraine headache and seizures. Was in the ER September 07, 2020 for reported seizure.  CT head was negative.  She sent a MyChart message, Keppra was increased to 750 mg twice a day. Was recently in the ER December 21, 2020, reportedly woke up, felt drowsy like she had seizure, went downstairs to get her sister. Then had another seizure, generalized seizure, no oral injury or incontinence. 2 seizures in the ambulance.Reports vomiting with blood, after seizures. For 2 days after she was coughing up blood. She didn't miss any doses of medication. Under more stress, just moved in with grandmother, single parent to 23 month old. Currently on Keppra 1000 mg twice daily. Seeing GI in March, vomiting has gone away. Taking Protonix daily. Mild occasional burning.  No more seizures.  Lipase, LFT, PT/INR, ETOH, unremarkable. WBC was 14.1, HGB 10.9.  HISTORY 07/23/2020 SS: Michelle Short is a 32 year old female with history of migraine headaches and seizures.  Last nocturnal seizure was in  March 2021, she had missed 2 doses of Keppra.  At last visit, was switched to [place] extended release, but didn't fill the medication for some reason.  She remains on Keppra 500 mg twice a day, reports compliance.  No recurrent seizure.  Indicates she has been doing well, tolerating well.  No new problems or concerns.  She has a 24-year-old son. Presents today for evaluation unaccompanied.    REVIEW OF SYSTEMS: Out of a complete 14 system review of symptoms, the patient complains only of the following  symptoms, and all other reviewed systems are negative.  See HPI  ALLERGIES: Allergies  Allergen Reactions   Ibuprofen Other (See Comments)    "interferes with my seizure medication"   Sulfa Antibiotics Rash    HOME MEDICATIONS: Outpatient Medications Prior to Visit  Medication Sig Dispense Refill   lamoTRIgine (LAMICTAL) 25 MG tablet Take 4 tablets by mouth (100mg  total) twice a day 20 tablet 0   levETIRAcetam (KEPPRA) 1000 MG tablet Take 1 tablet (1,000 mg total) by mouth 2 (two) times daily. 120 tablet 0   No facility-administered medications prior to visit.    PAST MEDICAL HISTORY: Past Medical History:  Diagnosis Date   Common migraine with intractable migraine 09/19/2018   Headache    Marijuana abuse    Seizures ([place]) 12/2017    PAST SURGICAL HISTORY: Past Surgical History:  Procedure Laterality Date   CESAREAN SECTION N/A 06/05/2019   Procedure: CESAREAN SECTION;  Surgeon: Osborne Oman, MD;  Location: [facility] LD [facility]S;  Service: Obstetrics;  Laterality: N/A;   ESOPHAGOGASTRODUODENOSCOPY (EGD) WITH PROPOFOL N/A 02/15/2021   Procedure: ESOPHAGOGASTRODUODENOSCOPY (EGD) WITH PROPOFOL;  Surgeon: Eloise Harman, DO;  Location[facility];  Service: Endoscopy;  Laterality: N/A;  PM    FAMILY HISTORY: Family History  Problem Relation Age of Onset   Hyperlipidemia Mother    Diabetes Paternal Grandfather    Colon cancer Maternal Grandmother        colon cancer   Heart attack Maternal Grandfather    Colon polyps Neg Hx     SOCIAL HISTORY: Social History   Socioeconomic History   Marital status: Single    Spouse name: Nickola Major   Number of children: 1   Years of education: 15   Highest education level: Some college, no degree  Occupational History   Not on file  Tobacco Use   Smoking status: Never   Smokeless tobacco: Never  Vaping Use   Vaping Use: Never used  Substance and Sexual Activity   Alcohol use: No   Drug use: Yes    Types: Marijuana     Comment: daily   Sexual activity: Yes    Birth control/protection: Condom, Abstinence  Other Topics Concern   Not on file  Social History Narrative   Not on file   Social Determinants of Health   Financial Resource Strain: Low Risk  (10/31/2018)   Overall Financial Resource Strain (CARDIA)    Difficulty of Paying Living Expenses: Not hard at all  Food Insecurity: No Food Insecurity (11/02/2022)   Hunger Vital Sign    Worried About Running Out of Food in the Last Year: Never true    [place] in the Last Year: Never true  Transportation Needs: No Transportation Needs (11/02/2022)   PRAPARE - Hydrologist (Medical): No    Lack of Transportation (Non-Medical): No  Physical Activity: Insufficiently Active (10/31/2018)   Exercise Vital Sign  Days of Exercise per Week: 3 days    Minutes of Exercise per Session: 40 min  Stress: No Stress Concern Present (10/31/2018)   [place]    Feeling of Stress : Not at all  Social Connections: Somewhat Isolated (10/31/2018)   Social Connection and Isolation Panel [NHANES]    Frequency of Communication with Friends and Family: More than three times a week    Frequency of Social Gatherings with Friends and Family: More than three times a week    Attends Religious Services: More than 4 times per year    Active Member of [employer] or Organizations: No    Attends Archivist Meetings: Never    Marital Status: Never married  Intimate Partner Violence: Not At Risk (10/31/2018)   Humiliation, Afraid, Rape, and Kick questionnaire    Fear of Current or Ex-Partner: No    Emotionally Abused: No    Physically Abused: No    Sexually Abused: No   PHYSICAL EXAM  Vitals:   02/15/23 1340  BP: (!) 128/91  Pulse: (!) 102  Weight: 106 lb (48.1 kg)  Height: 5\' 4"  (1.626 m)    Body mass index is 18.19 kg/m.  Generalized: Well developed, in no  acute distress   Neurological examination  Mentation: Alert oriented to time, place, history taking. Follows all commands speech and language fluent Cranial nerve II-XII: Pupils were equal round reactive to light. Extraocular movements were full, visual field were full on confrontational test. Facial sensation and strength were normal.  Head turning and shoulder shrug  were normal and symmetric. Motor: The motor testing reveals 5 over 5 strength of all 4 extremities. Good symmetric motor tone is noted throughout.  Sensory: Sensory testing is intact to soft touch on all 4 extremities. No evidence of extinction is noted.  Coordination: Cerebellar testing reveals good finger-nose-finger and heel-to-shin bilaterally.  Gait and station: Gait is normal.  Reflexes: Deep tendon reflexes are symmetric and normal bilaterally.   DIAGNOSTIC DATA (LABS, IMAGING, TESTING) - I reviewed patient records, labs, notes, testing and imaging myself where available.  Lab Results  Component Value Date   WBC 12.9 (H) 11/01/2022   HGB 11.2 (L) 11/01/2022   HCT 35.7 (L) 11/01/2022   MCV 89.0 11/01/2022   PLT 309 11/01/2022      Component Value Date/Time   NA 135 11/01/2022 2217   NA 141 11/01/2022 0912   K 3.3 (L) 11/01/2022 2217   CL 103 11/01/2022 2217   CO2 15 (L) 11/01/2022 2217   GLUCOSE 132 (H) 11/01/2022 2217   BUN 8 11/01/2022 2217   BUN 7 11/01/2022 0912   CREATININE 0.92 11/01/2022 2217   CALCIUM 8.5 (L) 11/01/2022 2217   PROT 7.3 11/01/2022 2217   ALBUMIN 4.2 11/01/2022 2217   AST 36 11/01/2022 2217   ALT 18 11/01/2022 2217   ALKPHOS 65 11/01/2022 2217   BILITOT 0.6 11/01/2022 2217   GFRNONAA >60 11/01/2022 2217   GFRAA >60 05/24/2020 1616   No results found for: "CHOL", "HDL", "LDLCALC", "LDLDIRECT", "TRIG", "CHOLHDL" No results found for: "HGBA1C" No results found for: "VITAMINB12" No results found for: "TSH"  ASSESSMENT AND PLAN 32 y.o. year old female  has a past medical history  of Common migraine with intractable migraine (09/19/2018), Headache, Marijuana abuse, and Seizures ([place]) (12/2017). here with:  1. Partial idiopathic epilepsy with seizures of localized onset, intractable, without status epilepticus ([place])   2. Therapeutic drug monitoring       -  Continue with Lamotrigine 100 mg twice daily -She did not take her medication this morning, therefore she will come back in a couple weeks for lab work  -Follow up in 6 months or sooner if worse.     Alric Ran, MD 02/15/2023, 2:16 PM [facility] [address] [employer] [employer] [place], [place] [address] [phone]

## 2023-02-16 ENCOUNTER — Telehealth: Payer: Self-pay | Admitting: Neurology

## 2023-02-16 NOTE — Telephone Encounter (Signed)
Yes, that is totally fine.

## 2023-02-16 NOTE — Telephone Encounter (Signed)
Pt called, Asking if its okay for her to take lamoTRIgine (LAMICTAL) 100 MG tablet  with Amoxicillin. Stated she is having tooth pain. Pt requesting a nurse give her a call back.

## 2023-03-08 DIAGNOSIS — Z0271 Encounter for disability determination: Secondary | ICD-10-CM

## 2023-04-04 ENCOUNTER — Encounter: Payer: Self-pay | Admitting: Neurology

## 2023-04-10 NOTE — Telephone Encounter (Signed)
Did she has a new hairstyle? I would suggest trying pain meds such as Tylenol/Motrin and to see her PCP first

## 2023-05-15 ENCOUNTER — Ambulatory Visit: Payer: Medicaid Other | Admitting: Women's Health

## 2023-08-14 ENCOUNTER — Encounter: Payer: Self-pay | Admitting: Neurology

## 2023-08-14 ENCOUNTER — Ambulatory Visit: Payer: Medicaid Other | Admitting: Neurology

## 2023-08-14 VITALS — BP 126/72 | Ht 64.0 in | Wt 116.0 lb

## 2023-08-14 DIAGNOSIS — Z5181 Encounter for therapeutic drug level monitoring: Secondary | ICD-10-CM

## 2023-08-14 DIAGNOSIS — G40019 Localization-related (focal) (partial) idiopathic epilepsy and epileptic syndromes with seizures of localized onset, intractable, without status epilepticus: Secondary | ICD-10-CM

## 2023-08-14 NOTE — Progress Notes (Signed)
PATIENT: Michelle Short DOB: 08/23/1991  REASON FOR VISIT: follow up for seizures HISTORY FROM: patient PRIMARY NEUROLOGIST: Dr. Teresa Coombs  HISTORY OF PRESENT ILLNESS: Today 08/14/23 Patient presents today for follow-up, last visit was in March and since then, she has been doing well, denies any seizure or seizure-like activity.  She is compliant with her lamotrigine 100 mg twice daily, denies any side effect.  Reports that her mood is better, she is sleeping better, her headaches are also better.  Overall no complaint no concerns.  INTERVAL HISTORY 02/15/2023:  [name] presented for follow-up, last visit was in December, at that time we planned to switch her from Keppra to lamotrigine.  She reported having 1 seizure in December and another on in on January 11, does not recall any precipitating factor. During this time, she was not able to afford the extended release and was not taking any medications. Her last seizure was Jan 11.  Since then, we put her Lamotrigine IR 100 mg BID. She tolerated lamotrigine very well and no side effects.  Her mood is much better today.  No other complaints.   INTERVAL HISTORY 11/01/2022 [name] presents today for follow-up, last visit was in October 31.  At that time we had planned to start her on lamotrigine XR up to 200 mg daily.  She has started the medication, denies any side effect from the medication and actually taking 100 mg XR twice daily.  She reports there are still days where she is unsure if she took the medication or not.  The initial plan was to take it once a day.  In terms of the Keppra, she finished the prescription a week ago and has not renewed it.  She has not reported any additional seizures Overall feel denies any new side effect from the lamotrigine.   INTERVAL HISTORY 09/20/2022:  Patient presents today for follow-up, she is accompanied by her mother.  Last visit was in February since then he she had 3 additional seizures starting  September.  She reported being compliant with Keppra 500 mg twice daily but there are also periods of nonadherence.  She reported seizure on September 2, another 22 July 2028 and the last one on October 28. With the seizure on Oct 28, she fell and hit her head.    Update 01/12/2022 SS: [name] here today for follow-up with history of headaches and seizures.  Last seizure was in May 2022, went to the ER. In the past admits to not being compliant with Keppra with seizures, at 1 time was supposed to be taking 1000 mg twice daily. Currently taking 500 mg twice daily Keppra no seizure since May 2022. 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 Is compliant with her medications now. Sometime has irritability from Keppra. All seizures have been nocturnal.   Update 12/31/2020 SS: [name] is a 32 year old female with history of migraine headache and seizures. Was in the ER September 07, 2020 for reported seizure.  CT head was negative.  She sent a MyChart message, Keppra was increased to 750 mg twice a day. Was recently in the ER December 21, 2020, reportedly woke up, felt drowsy like she had seizure, went downstairs to get her sister. Then had another seizure, generalized seizure, no oral injury or incontinence. 2 seizures in the ambulance.Reports vomiting with blood, after seizures. For 2 days after she was coughing up blood. She didn't miss any doses of medication. Under more stress, just moved in with  grandmother, single parent to 29 month old. Currently on Keppra 1000 mg twice daily. Seeing GI in March, vomiting has gone away. Taking Protonix daily. Mild occasional burning.  No more seizures.  Lipase, LFT, PT/INR, ETOH, unremarkable. WBC was 14.1, HGB 10.9.  HISTORY 07/23/2020 SS: [name] is a 32 year old female with history of migraine headaches and seizures.  Last nocturnal seizure was in March 2021, she had missed 2 doses of Keppra.  At last visit, was switched to Keppra extended release, but  didn't fill the medication for some reason.  She remains on Keppra 500 mg twice a day, reports compliance.  No recurrent seizure.  Indicates she has been doing well, tolerating well.  No new problems or concerns.  She has a 24-year-old son. Presents today for evaluation unaccompanied.    REVIEW OF SYSTEMS: Out of a complete 14 system review of symptoms, the patient complains only of the following symptoms, and all other reviewed systems are negative.  See HPI  ALLERGIES: Allergies  Allergen Reactions   Ibuprofen Other (See Comments)    "interferes with my seizure medication"   Sulfa Antibiotics Rash    HOME MEDICATIONS: Outpatient Medications Prior to Visit  Medication Sig Dispense Refill   lamoTRIgine (LAMICTAL) 100 MG tablet Take 1 tablet (100 mg total) by mouth 2 (two) times daily. 180 tablet 3   No facility-administered medications prior to visit.    PAST MEDICAL HISTORY: Past Medical History:  Diagnosis Date   Common migraine with intractable migraine 09/19/2018   Headache    Marijuana abuse    Seizures (HCC) 12/2017    PAST SURGICAL HISTORY: Past Surgical History:  Procedure Laterality Date   CESAREAN SECTION N/A 06/05/2019   Procedure: CESAREAN SECTION;  Surgeon: Tereso Newcomer, MD;  Location: [facility];  Service: Obstetrics;  Laterality: N/A;   ESOPHAGOGASTRODUODENOSCOPY (EGD) WITH PROPOFOL N/A 02/15/2021   Procedure: ESOPHAGOGASTRODUODENOSCOPY (EGD) WITH PROPOFOL;  Surgeon: Lanelle Bal, DO;  Location: [facility];  Service: Endoscopy;  Laterality: N/A;  PM    FAMILY HISTORY: Family History  Problem Relation Age of Onset   Hyperlipidemia Mother    Diabetes Paternal Grandfather    Colon cancer Maternal Grandmother        colon cancer   Heart attack Maternal Grandfather    Colon polyps Neg Hx     SOCIAL HISTORY: Social History   Socioeconomic History   Marital status: Single    Spouse name: Irving Copas   Number of children: 1   Years of  education: 15   Highest education level: Some college, no degree  Occupational History   Not on file  Tobacco Use   Smoking status: Never   Smokeless tobacco: Never  Vaping Use   Vaping status: Never Used  Substance and Sexual Activity   Alcohol use: No   Drug use: Not Currently    Types: Marijuana    Comment: occassionally   Sexual activity: Yes    Birth control/protection: Condom, Abstinence  Other Topics Concern   Not on file  Social History Narrative   Right handed   Caffeine -occasionally   Lives with family   Social Determinants of Health   Financial Resource Strain: Low Risk  (10/31/2018)   Overall Financial Resource Strain (CARDIA)    Difficulty of Paying Living Expenses: Not hard at all  Food Insecurity: No Food Insecurity (11/02/2022)   Hunger Vital Sign    Worried About Running Out of Food in the Last Year: Never  true    Ran Out of Food in the Last Year: Never true  Transportation Needs: No Transportation Needs (11/02/2022)   PRAPARE - Administrator, Civil Service (Medical): No    Lack of Transportation (Non-Medical): No  Physical Activity: Insufficiently Active (10/31/2018)   Exercise Vital Sign    Days of Exercise per Week: 3 days    Minutes of Exercise per Session: 40 min  Stress: No Stress Concern Present (10/31/2018)   [employer] of Occupational Health - Occupational Stress Questionnaire    Feeling of Stress : Not at all  Social Connections: Somewhat Isolated (10/31/2018)   Social Connection and Isolation Panel [NHANES]    Frequency of Communication with Friends and Family: More than three times a week    Frequency of Social Gatherings with Friends and Family: More than three times a week    Attends Religious Services: More than 4 times per year    Active Member of [employer] or Organizations: No    Attends Banker Meetings: Never    Marital Status: Never married  Intimate Partner Violence: Not At Risk (10/31/2018)    Humiliation, Afraid, Rape, and Kick questionnaire    Fear of Current or Ex-Partner: No    Emotionally Abused: No    Physically Abused: No    Sexually Abused: No   PHYSICAL EXAM  Vitals:   08/14/23 1527  BP: 126/72  Weight: 116 lb (52.6 kg)  Height: 5\' 4"  (1.626 m)    Body mass index is 19.91 kg/m.  Generalized: Well developed, in no acute distress   Neurological examination  Mentation: Alert oriented to time, place, history taking. Follows all commands speech and language fluent Cranial nerve II-XII: Pupils were equal round reactive to light. Extraocular movements were full, visual field were full on confrontational test. Facial sensation and strength were normal.  Head turning and shoulder shrug  were normal and symmetric. Motor: The motor testing reveals 5 over 5 strength of all 4 extremities. Good symmetric motor tone is noted throughout.  Sensory: Sensory testing is intact to soft touch on all 4 extremities. No evidence of extinction is noted.  Coordination: Cerebellar testing reveals good finger-nose-finger and heel-to-shin bilaterally.  Gait and station: Gait is normal.  Reflexes: Deep tendon reflexes are symmetric and normal bilaterally.   DIAGNOSTIC DATA (LABS, IMAGING, TESTING) - I reviewed patient records, labs, notes, testing and imaging myself where available.  Lab Results  Component Value Date   WBC 12.9 (H) 11/01/2022   HGB 11.2 (L) 11/01/2022   HCT 35.7 (L) 11/01/2022   MCV 89.0 11/01/2022   PLT 309 11/01/2022      Component Value Date/Time   NA 135 11/01/2022 2217   NA 141 11/01/2022 0912   K 3.3 (L) 11/01/2022 2217   CL 103 11/01/2022 2217   CO2 15 (L) 11/01/2022 2217   GLUCOSE 132 (H) 11/01/2022 2217   BUN 8 11/01/2022 2217   BUN 7 11/01/2022 0912   CREATININE 0.92 11/01/2022 2217   CALCIUM 8.5 (L) 11/01/2022 2217   PROT 7.3 11/01/2022 2217   ALBUMIN 4.2 11/01/2022 2217   AST 36 11/01/2022 2217   ALT 18 11/01/2022 2217   ALKPHOS 65 11/01/2022  2217   BILITOT 0.6 11/01/2022 2217   GFRNONAA >60 11/01/2022 2217   GFRAA >60 05/24/2020 1616   No results found for: "CHOL", "HDL", "LDLCALC", "LDLDIRECT", "TRIG", "CHOLHDL" No results found for: "HGBA1C" No results found for: "VITAMINB12" No results found for: "TSH"  ASSESSMENT AND PLAN 32 y.o. year old female  has a past medical history of Common migraine with intractable migraine (09/19/2018), Headache, Marijuana abuse, and Seizures (HCC) (12/2017). here with:  1. Partial idiopathic epilepsy with seizures of localized onset, intractable, without status epilepticus (HCC)   2. Therapeutic drug monitoring      Patient Instructions  Continue with Lamotrigine 100 mg twice daily  Start Prenatal vitamin daily.  Will do some blood work today  Return in a year or sooner if worse      Windell Norfolk, MD 08/14/2023, 4:09 PM [facility] [address], [address] [place], [place] [address] [phone]

## 2023-08-14 NOTE — Patient Instructions (Addendum)
Continue with Lamotrigine 100 mg twice daily  Start Prenatal vitamin daily.  Will do some blood work today  Return in a year or sooner if worse

## 2023-08-16 LAB — BASIC METABOLIC PANEL
BUN/Creatinine Ratio: 12 (ref 9–23)
BUN: 9 mg/dL (ref 6–20)
CO2: 24 mmol/L (ref 20–29)
Calcium: 9.8 mg/dL (ref 8.7–10.2)
Chloride: 101 mmol/L (ref 96–106)
Creatinine, Ser: 0.74 mg/dL (ref 0.57–1.00)
Glucose: 98 mg/dL (ref 70–99)
Potassium: 4.1 mmol/L (ref 3.5–5.2)
Sodium: 139 mmol/L (ref 134–144)
eGFR: 111 mL/min/{1.73_m2} (ref >59)

## 2023-08-16 LAB — LAMOTRIGINE LEVEL: Lamotrigine Lvl: 4.2 ug/mL (ref 2.0–20.0)

## 2023-09-07 ENCOUNTER — Encounter (HOSPITAL_COMMUNITY): Payer: Self-pay

## 2023-09-07 ENCOUNTER — Emergency Department (HOSPITAL_COMMUNITY)
Admission: EM | Admit: 2023-09-07 | Discharge: 2023-09-07 | Disposition: A | Discharge status: Home / Self Care | Payer: Medicaid Other | Attending: Emergency Medicine | Admitting: Emergency Medicine

## 2023-09-07 ENCOUNTER — Other Ambulatory Visit: Payer: Self-pay

## 2023-09-07 DIAGNOSIS — K029 Dental caries, unspecified: Secondary | ICD-10-CM | POA: Insufficient documentation

## 2023-09-07 DIAGNOSIS — K0889 Other specified disorders of teeth and supporting structures: Secondary | ICD-10-CM | POA: Diagnosis not present

## 2023-09-07 MED ORDER — AMOXICILLIN 500 MG PO CAPS: 500.0000 mg | ORAL_CAPSULE | Freq: Three times a day (TID) | ORAL | 0 refills | Status: DC

## 2023-09-07 MED ORDER — NAPROXEN 500 MG PO TABS: 500.0000 mg | ORAL_TABLET | Freq: Two times a day (BID) | ORAL | 0 refills | Status: AC

## 2023-09-07 MED ORDER — AMOXICILLIN 250 MG PO CAPS
500.0000 mg | ORAL_CAPSULE | Freq: Once | ORAL | Status: AC
  Administered 2023-09-07: 500 mg via ORAL
  Filled 2023-09-07: qty 2

## 2023-09-07 NOTE — ED Provider Notes (Signed)
[place] EMERGENCY DEPARTMENT AT [facility] Provider Note   CSN: [id number] Arrival date & time: 09/07/23  1925     History  Chief Complaint  Patient presents with   Dental Pain    Michelle Short is a 32 y.o. female.   Dental Pain    This patient is a 32 year old female, she presents with dental pain, right lower and right upper jaw, has been going on for couple of days, feels like something broke, she does have a dentist but has not seen them for this, she denies any swelling of her face but has pain when she touches the side of her face.  No fevers or chills, no nausea or vomiting, no difficulty swallowing or breathing.  Has taken Tylenol with minimal relief, states that she is on seizure medication so has been told that she cannot take ibuprofen  Home Medications Prior to Admission medications   Medication Sig Start Date End Date Taking? Authorizing Provider  amoxicillin (AMOXIL) 500 MG capsule Take 1 capsule (500 mg total) by mouth 3 (three) times daily. 09/07/23  Yes Eber Hong, MD  naproxen (NAPROSYN) 500 MG tablet Take 1 tablet (500 mg total) by mouth 2 (two) times daily with a meal. 09/07/23  Yes Eber Hong, MD  lamoTRIgine (LAMICTAL) 100 MG tablet Take 1 tablet (100 mg total) by mouth 2 (two) times daily. 02/15/23 02/10/24  Windell Norfolk, MD      Allergies    Ibuprofen and Sulfa antibiotics    Review of Systems   Review of Systems  All other systems reviewed and are negative.   Physical Exam Updated Vital Signs BP 121/81 (BP Location: Right Arm)   Pulse 79   Resp 17   Ht 1.626 m (5\' 4" )   Wt 53.5 kg   LMP 08/07/2023   SpO2 97%   BMI 20.25 kg/m  Physical Exam Vitals and nursing note reviewed.  Constitutional:      General: She is not in acute distress.    Appearance: She is well-developed.  HENT:     Head: Normocephalic and atraumatic.     Mouth/Throat:     Pharynx: No oropharyngeal exudate.     Comments: There is no trismus or  torticollis, the patient can open her mouth without difficulty, there is no swelling of the face or the jaw, on the inner jaw there appears to be multiple teeth which have some damage, there is no obvious swelling of the gingiva, no tenderness or lumps or phlegmon or abscesses palpated on the inner or outer gingiva.  The tongue is normal, the sublingual area is normal, the neck is normal without lymphadenopathy. Eyes:     General: No scleral icterus.       Right eye: No discharge.        Left eye: No discharge.     Conjunctiva/sclera: Conjunctivae normal.     Pupils: Pupils are equal, round, and reactive to light.  Neck:     Thyroid: No thyromegaly.     Vascular: No JVD.  Cardiovascular:     Rate and Rhythm: Normal rate and regular rhythm.     Heart sounds: Normal heart sounds. No murmur heard.    No friction rub. No gallop.  Pulmonary:     Effort: Pulmonary effort is normal. No respiratory distress.     Breath sounds: Normal breath sounds. No wheezing or rales.  Abdominal:     General: Bowel sounds are normal. There is no distension.  Palpations: Abdomen is soft. There is no mass.     Tenderness: There is no abdominal tenderness.  Musculoskeletal:        General: No tenderness. Normal range of motion.     Cervical back: Normal range of motion and neck supple.  Lymphadenopathy:     Cervical: No cervical adenopathy.  Skin:    General: Skin is warm and dry.     Findings: No erythema or rash.  Neurological:     Mental Status: She is alert.     Coordination: Coordination normal.  Psychiatric:        Behavior: Behavior normal.     ED Results / Procedures / Treatments   Labs (all labs ordered are listed, but only abnormal results are displayed) Labs Reviewed - No data to display  EKG None  Radiology No results found.  Procedures Procedures    Medications Ordered in ED Medications  amoxicillin (AMOXIL) capsule 500 mg (has no administration in time range)    ED  Course/ Medical Decision Making/ A&P                                 Medical Decision Making  Ultimately this patient is in no distress but has dental pain, he has normal vital signs, there is no fevers or chills, no tachycardia, she has no signs of intra oral abscess.  Will treat with amoxicillin at home with the same, referral to dentistry, patient agreeable.  We will prescribe an anti-inflammatory, she states she is not allergic to it but was told that she should not take it that often.        Final Clinical Impression(s) / ED Diagnoses Final diagnoses:  Pain due to dental caries    Rx / DC Orders ED Discharge Orders          Ordered    naproxen (NAPROSYN) 500 MG tablet  2 times daily with meals        09/07/23 2151    amoxicillin (AMOXIL) 500 MG capsule  3 times daily        09/07/23 2151              Eber Hong, MD 09/07/23 2152

## 2023-09-07 NOTE — Discharge Instructions (Signed)
You may follow up with Dr. Sonda Rumble: [phone]  [address] [place], [place] [address]  Amoxicillin 3 times a day for 10 days, Naprosyn twice a day for pain, ER for severe worsening pain swelling or fever

## 2023-09-07 NOTE — ED Triage Notes (Signed)
Pt states back tooth broken and there is a cavity as well. Pain has been since yesterday. Pt had tylenol today 600mg  x4. Pain is radiating to jaw and head. Denies any discomfort/inability to swallow.

## 2023-09-20 ENCOUNTER — Encounter: Payer: Self-pay | Admitting: Adult Health

## 2023-09-20 ENCOUNTER — Other Ambulatory Visit (HOSPITAL_COMMUNITY)
Admission: RE | Admit: 2023-09-20 | Discharge: 2023-09-20 | Disposition: A | Discharge status: Home / Self Care | Payer: Medicaid Other | Source: Ambulatory Visit | Attending: Women's Health | Admitting: Women's Health

## 2023-09-20 ENCOUNTER — Ambulatory Visit: Payer: Medicaid Other | Admitting: Adult Health

## 2023-09-20 VITALS — BP 129/75 | HR 105 | Ht 64.0 in | Wt 112.0 lb

## 2023-09-20 DIAGNOSIS — Z113 Encounter for screening for infections with a predominantly sexual mode of transmission: Secondary | ICD-10-CM | POA: Insufficient documentation

## 2023-09-20 DIAGNOSIS — N9089 Other specified noninflammatory disorders of vulva and perineum: Secondary | ICD-10-CM

## 2023-09-20 DIAGNOSIS — Z1339 Encounter for screening examination for other mental health and behavioral disorders: Secondary | ICD-10-CM | POA: Diagnosis not present

## 2023-09-20 DIAGNOSIS — Z01419 Encounter for gynecological examination (general) (routine) without abnormal findings: Secondary | ICD-10-CM | POA: Diagnosis not present

## 2023-09-20 DIAGNOSIS — N898 Other specified noninflammatory disorders of vagina: Secondary | ICD-10-CM | POA: Diagnosis not present

## 2023-09-20 MED ORDER — FLUCONAZOLE 150 MG PO TABS: ORAL_TABLET | ORAL | 1 refills | Status: DC

## 2023-09-20 NOTE — Progress Notes (Signed)
Patient ID: Michelle Short, female   DOB: April 26, 1991, 32 y.o.   MRN: [id number] History of Present Illness: Michelle Short is a 32 year old black female,with S), G3P1021 in for a well woman gyn exam. She has been on amoxil for tooth and may have yeast infection, feels irritated.     Component Value Date/Time   DIAGPAP  05/11/2022 1059    - Negative for intraepithelial lesion or malignancy (NILM)   DIAGPAP  07/30/2019 0000    NEGATIVE FOR INTRAEPITHELIAL LESIONS OR MALIGNANCY.   HPVHIGH Negative 05/11/2022 1059   ADEQPAP  05/11/2022 1059    Satisfactory for evaluation; transformation zone component ABSENT.   ADEQPAP  07/30/2019 0000    Satisfactory for evaluation  endocervical/transformation zone component PRESENT.     Current Medications, Allergies, Past Medical History, Past Surgical History, Family History and Social History were reviewed in [employer] record.     Review of Systems: Patient denies any headaches, hearing loss, fatigue, blurred vision, shortness of breath, chest pain, abdominal pain, problems with bowel movements, urination, or intercourse. No joint pain or mood swings.  Vaginal irritation    Physical Exam:BP 129/75 (BP Location: Left Arm, Patient Position: Sitting, Cuff Size: Normal)   Pulse (!) 105   Ht 5\' 4"  (1.626 m)   Wt 112 lb (50.8 kg)   LMP 08/21/2023   BMI 19.22 kg/m   General:  Well developed, well nourished, no acute distress Skin:  Warm and dry Neck:  Midline trachea, normal thyroid, good ROM, no lymphadenopathy Lungs; Clear to auscultation bilaterally Breast:  No dominant palpable mass, retraction, or nipple discharge Cardiovascular: Regular rate and rhythm Abdomen:  Soft, non tender, no hepatosplenomegaly Pelvic:  External genitalia is normal in appearance, has round mobile mass about size of marble left labia(she says it hurts with sex at times) The vagina is normal in appearance,scant white discharge without  odor. Urethra has no  lesions or masses. The cervix is smooth, CV swb obtained Uterus is felt to be normal size, shape, and contour.  No adnexal masses or tenderness noted.Bladder is non tender, no masses felt. Extremities/musculoskeletal:  No swelling or varicosities noted, no clubbing or cyanosis Psych:  No mood changes, alert and cooperative,seems happy AA is 0    09/20/2023    2:11 PM 07/30/2019    4:01 PM 10/31/2018    2:46 PM  Depression screen PHQ 2/9  Decreased Interest 0 0 0  Down, Depressed, Hopeless 0 0 0  PHQ - 2 Score 0 0 0  Altered sleeping 1  0  Tired, decreased energy 1  0  Change in appetite 2  0  Feeling bad or failure about yourself  0  0  Trouble concentrating 1  0  Moving slowly or fidgety/restless 0  0  Suicidal thoughts 0    PHQ-9 Score 5  0       09/20/2023    2:12 PM  GAD 7 : Generalized Anxiety Score  Nervous, Anxious, on Edge 0  Control/stop worrying 0  Worry too much - different things 1  Trouble relaxing 1  Restless 0  Easily annoyed or irritable 1  Afraid - awful might happen 1  Total GAD 7 Score 4    Upstream - 09/20/23 1410       Pregnancy Intention Screening   Does the patient want to become pregnant in the next year? Yes    Does the patient's partner want to become pregnant in the next year? Yes  Would the patient like to discuss contraceptive options today? No      Contraception Wrap Up   Current Method No Method - Other Reason    Reason for No Current Contraceptive Method at Intake ([facility] Only) Abstinence    End Method Female Condom              Examination chaperoned by Malachy Mood LPN   Impression and plan: 1. Encounter for well woman exam with routine gynecological exam Physical in 1 year Pap in 2026   2. Vaginal irritation CV swab sent Rx sent in for diflucan Meds ordered this encounter  Medications   fluconazole (DIFLUCAN) 150 MG tablet    Sig: Take 1 now and 1 in 3 days if needed    Dispense:  2 tablet    Refill:  1    Order  Specific Question:   Supervising Provider    Answer:   Duane Lope H [[id number]]    - Cervicovaginal ancillary only( [place])  3. Screening examination for STD (sexually transmitted disease) CV swab sent for GC/CHL,trich,BV and yeast  - Cervicovaginal ancillary only( [place])  4. Vulvar mass Return in about 2 weeks for Dr [name] to assess

## 2023-09-22 LAB — CERVICOVAGINAL ANCILLARY ONLY
Bacterial Vaginitis (gardnerella): NEGATIVE
Candida Glabrata: NEGATIVE
Candida Vaginitis: POSITIVE — AB
Chlamydia: NEGATIVE
Comment: NEGATIVE
Comment: NEGATIVE
Comment: NEGATIVE
Comment: NEGATIVE
Comment: NEGATIVE
Comment: NORMAL
Neisseria Gonorrhea: NEGATIVE
Trichomonas: NEGATIVE

## 2023-09-25 ENCOUNTER — Other Ambulatory Visit: Payer: Self-pay | Admitting: Adult Health

## 2023-10-05 ENCOUNTER — Ambulatory Visit: Payer: Medicaid Other | Admitting: Obstetrics & Gynecology

## 2023-10-25 ENCOUNTER — Encounter: Payer: Self-pay | Admitting: Obstetrics & Gynecology

## 2023-10-25 ENCOUNTER — Ambulatory Visit: Payer: Medicaid Other | Admitting: Obstetrics & Gynecology

## 2023-10-25 VITALS — BP 119/65 | HR 86 | Ht 64.0 in | Wt 111.0 lb

## 2023-10-25 DIAGNOSIS — Z3009 Encounter for other general counseling and advice on contraception: Secondary | ICD-10-CM

## 2023-10-25 DIAGNOSIS — N907 Vulvar cyst: Secondary | ICD-10-CM

## 2023-10-25 NOTE — Progress Notes (Signed)
   GYN VISIT Patient name: Michelle Short MRN [id number]  Date of birth: 12-02-1990 Chief Complaint:   vulvar mass  History of Present Illness:   Michelle Short is a 32 y.o. G21P1021 female being seen today for the following concerns:  -Vulvar cyst: This has been present since 2019- not painful, on occasion it may swell specifically during IC then resolve.  Denies vaginal discharge itching or irritation.  Denies pelvic or abdominal pain.  Overall it is not bothersome to her.     Menses are regular each month.  She is sexually active with her boyfriend who actually lives in [place].  He would desire a pregnancy, but she is not quite ready.  They are typically having intercourse every 2 weeks or so  No LMP recorded.    Review of Systems:   Pertinent items are noted in HPI Denies fever/chills, dizziness, headaches, visual disturbances, fatigue, shortness of breath, chest pain, abdominal pain, vomiting, no problems with periods, bowel movements, urination, or intercourse unless otherwise stated above.  Pertinent History Reviewed:   Past Surgical History:  Procedure Laterality Date   CESAREAN SECTION N/A 06/05/2019   Procedure: CESAREAN SECTION;  Surgeon: Tereso Newcomer, MD;  Location: [facility] LD ORS;  Service: Obstetrics;  Laterality: N/A;   ESOPHAGOGASTRODUODENOSCOPY (EGD) WITH PROPOFOL N/A 02/15/2021   Procedure: ESOPHAGOGASTRODUODENOSCOPY (EGD) WITH PROPOFOL;  Surgeon: Lanelle Bal, DO;  Location: [facility];  Service: Endoscopy;  Laterality: N/A;  PM    Past Medical History:  Diagnosis Date   Common migraine with intractable migraine 09/19/2018   Headache    Marijuana abuse    Seizures (HCC) 12/2017   Reviewed problem list, medications and allergies. Physical Assessment:   Vitals:   10/25/23 1530  BP: 119/65  Pulse: 86  Weight: 111 lb (50.3 kg)  Height: 5\' 4"  (1.626 m)  Body mass index is 19.05 kg/m.       Physical Examination:   General appearance:  alert, well appearing, and in no distress  Psych: mood appropriate, normal affect  Skin: warm & dry   Cardiovascular: normal heart rate noted  Respiratory: normal respiratory effort, no distress  Abdomen: soft, non-tender   Pelvic: VULVA: left labia ~ 1cm inclusion cyst noted-mobile, non-painful- no erythema or induration.  No other abnormalities noted  Extremities: no edema   Chaperone: Faith Rogue    Assessment & Plan:  1) Vulvar cyst -Reassured patient of benign findings -Surgical intervention not indicated as she is asymptomatic -Should she note pain, swelling or other acute changes return to clinic  2) Family planning -Discussed ovulation window -Encourage PNV daily   No orders of the defined types were placed in this encounter.   Return in about 1 year (around 10/24/2024) for Annual.   Myna Hidalgo, DO Attending Obstetrician & Gynecologist, [facility] for [facility], [facility]

## 2024-02-20 ENCOUNTER — Other Ambulatory Visit: Payer: Self-pay | Admitting: Neurology

## 2024-06-10 ENCOUNTER — Emergency Department (HOSPITAL_COMMUNITY)

## 2024-06-10 ENCOUNTER — Ambulatory Visit: Payer: Self-pay

## 2024-06-10 ENCOUNTER — Encounter (HOSPITAL_COMMUNITY): Payer: Self-pay | Admitting: Emergency Medicine

## 2024-06-10 ENCOUNTER — Other Ambulatory Visit: Payer: Self-pay

## 2024-06-10 ENCOUNTER — Emergency Department (HOSPITAL_COMMUNITY)
Admission: EM | Admit: 2024-06-10 | Discharge: 2024-06-10 | Disposition: A | Discharge status: Home / Self Care | Attending: Emergency Medicine | Admitting: Emergency Medicine

## 2024-06-10 DIAGNOSIS — R0789 Other chest pain: Secondary | ICD-10-CM | POA: Insufficient documentation

## 2024-06-10 DIAGNOSIS — R079 Chest pain, unspecified: Secondary | ICD-10-CM | POA: Diagnosis not present
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
## 2024-06-26 ENCOUNTER — Other Ambulatory Visit: Payer: Self-pay | Admitting: Neurology

## 2024-07-25 ENCOUNTER — Other Ambulatory Visit: Payer: Self-pay | Admitting: Neurology

## 2024-08-01 NOTE — Telephone Encounter (Signed)
LVM for patient to call to schedule an appointment

## 2024-08-07 NOTE — Telephone Encounter (Signed)
LVM for patient to call the office to schedule an appointment.

## 2024-08-12 ENCOUNTER — Ambulatory Visit: Payer: Medicaid Other | Admitting: Neurology

## 2024-08-20 NOTE — Telephone Encounter (Signed)
 Pt called back appt Scheduled

## 2024-09-02 ENCOUNTER — Encounter: Payer: Self-pay | Admitting: Neurology

## 2024-09-02 ENCOUNTER — Ambulatory Visit: Admitting: Neurology

## 2024-09-02 VITALS — BP 122/76 | HR 81 | Resp 17 | Ht 64.0 in | Wt 110.5 lb

## 2024-09-02 DIAGNOSIS — Z5181 Encounter for therapeutic drug level monitoring: Secondary | ICD-10-CM

## 2024-09-02 DIAGNOSIS — G40019 Localization-related (focal) (partial) idiopathic epilepsy and epileptic syndromes with seizures of localized onset, intractable, without status epilepticus: Secondary | ICD-10-CM

## 2024-09-02 MED ORDER — LAMOTRIGINE 100 MG PO TABS: 100.0000 mg | ORAL_TABLET | Freq: Two times a day (BID) | ORAL | 3 refills | Status: AC

## 2024-09-02 NOTE — Progress Notes (Signed)
 PATIENT: Michelle Short DOB: 1991-10-14  REASON FOR VISIT: follow up for seizures HISTORY FROM: patient PRIMARY NEUROLOGIST: Dr. Gregg  HISTORY OF PRESENT ILLNESS: Today 09/02/24 Michelle Short presents today for follow-up, last visit was a year ago; since then she has been doing well, denies any seizure or seizure-like activity.  She is compliant with lamotrigine  100 mg twice daily, denies any side effects.  She tells me sometimes she has difficulty falling asleep but this is not all the time.  No other complaints or concerns. She tells me since her last seizure, she stopped in May she had left-sided weakness and numbness.  This is intermittent.   INTERVAL HISTORY 08/14/2023 Patient presents today for follow-up, last visit was in March and since then, she has been doing well, denies any seizure or seizure-like activity.  She is compliant with her lamotrigine  100 mg twice daily, denies any side effect.  Reports that her mood is better, she is sleeping better, her headaches are also better.  Overall no complaint no concerns.  INTERVAL HISTORY 02/15/2023:  [name] presented for follow-up, last visit was in December, at that time we planned to switch her from Keppra  to lamotrigine .  She reported having 1 seizure in December and another on in on January 11, does not recall any precipitating factor. During this time, she was not able to afford the extended release and was not taking any medications. Her last seizure was Jan 11.  Since then, we put her Lamotrigine  IR 100 mg BID. She tolerated lamotrigine  very well and no side effects.  Her mood is much better today.  No other complaints.   INTERVAL HISTORY 11/01/2022 [name] presents today for follow-up, last visit was in October 31.  At that time we had planned to start her on lamotrigine  XR up to 200 mg daily.  She has started the medication, denies any side effect from the medication and actually taking 100 mg XR twice daily.  She reports there are still  days where she is unsure if she took the medication or not.  The initial plan was to take it once a day.  In terms of the Keppra , she finished the prescription a week ago and has not renewed it.  She has not reported any additional seizures Overall feel denies any new side effect from the lamotrigine .   INTERVAL HISTORY 09/20/2022:  Patient presents today for follow-up, she is accompanied by her mother.  Last visit was in February since then he she had 3 additional seizures starting September.  She reported being compliant with Keppra  500 mg twice daily but there are also periods of nonadherence.  She reported seizure on September 2, another 22 July 2028 and the last one on October 28. With the seizure on Oct 28, she fell and hit her head.    Update 01/12/2022 SS: [name] here today for follow-up with history of headaches and seizures.  Last seizure was in May 2022, went to the ER. In the past admits to not being compliant with Keppra  with seizures, at 1 time was supposed to be taking 1000 mg twice daily. Currently taking 500 mg twice daily Keppra  no seizure since May 2022. 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 Is compliant with her medications now. Sometime has irritability from Keppra . All seizures have been nocturnal.   Update 12/31/2020 SS: [name] is a 33 year old female with history of migraine headache and seizures. Was in the ER September 07, 2020 for reported seizure.  CT head was negative.  She sent a MyChart message, Keppra  was increased to 750 mg twice a day. Was recently in the ER December 21, 2020, reportedly woke up, felt drowsy like she had seizure, went downstairs to get her sister. Then had another seizure, generalized seizure, no oral injury or incontinence. 2 seizures in the ambulance.Reports vomiting with blood, after seizures. For 2 days after she was coughing up blood. She didn't miss any doses of medication. Under more stress, just moved in with grandmother, single  parent to 27 month old. Currently on Keppra  1000 mg twice daily. Seeing GI in March, vomiting has gone away. Taking Protonix  daily. Mild occasional burning.  No more seizures.  Lipase, LFT, PT/INR, ETOH, unremarkable. WBC was 14.1, HGB 10.9.  HISTORY 07/23/2020 SS: [name] is a 33 year old female with history of migraine headaches and seizures.  Last nocturnal seizure was in March 2021, she had missed 2 doses of Keppra .  At last visit, was switched to Keppra  extended release, but didn't fill the medication for some reason.  She remains on Keppra  500 mg twice a day, reports compliance.  No recurrent seizure.  Indicates she has been doing well, tolerating well.  No new problems or concerns.  She has a 88-year-old son. Presents today for evaluation unaccompanied.    REVIEW OF SYSTEMS: Out of a complete 14 system review of symptoms, the patient complains only of the following symptoms, and all other reviewed systems are negative.  See HPI  ALLERGIES: Allergies  Allergen Reactions   Ibuprofen  Other (See Comments)    interferes with my seizure medication   Sulfa  Antibiotics Rash    HOME MEDICATIONS: Outpatient Medications Prior to Visit  Medication Sig Dispense Refill   naproxen  (NAPROSYN ) 500 MG tablet Take 1 tablet (500 mg total) by mouth 2 (two) times daily with a meal. 30 tablet 0   Prenatal Vit-Fe Fumarate-FA (PRENATAL VITAMIN PO) Take by mouth.     lamoTRIgine  (LAMICTAL ) 100 MG tablet TAKE 1 TABLET (100 MG TOTAL) BY MOUTH 2 (TWO) TIMES DAILY. APPOINTMENT NEEDED FOR FURTHER REFILLS 30 tablet 0   No facility-administered medications prior to visit.    PAST MEDICAL HISTORY: Past Medical History:  Diagnosis Date   Common migraine with intractable migraine 09/19/2018   Headache    Marijuana abuse    Seizures (HCC) 12/2017    PAST SURGICAL HISTORY: Past Surgical History:  Procedure Laterality Date   CESAREAN SECTION N/A 06/05/2019   Procedure: CESAREAN SECTION;  Surgeon:  Herchel Gloris LABOR, MD;  Location: [facility];  Service: Obstetrics;  Laterality: N/A;   ESOPHAGOGASTRODUODENOSCOPY (EGD) WITH PROPOFOL  N/A 02/15/2021   Procedure: ESOPHAGOGASTRODUODENOSCOPY (EGD) WITH PROPOFOL ;  Surgeon: Cindie Carlin POUR, DO;  Location: [facility];  Service: Endoscopy;  Laterality: N/A;  PM    FAMILY HISTORY: Family History  Problem Relation Age of Onset   Hyperlipidemia Mother    Diabetes Paternal Grandfather    Colon cancer Maternal Grandmother        colon cancer   Heart attack Maternal Grandfather    Colon polyps Neg Hx     SOCIAL HISTORY: Social History   Socioeconomic History   Marital status: Significant Other    Spouse name: Marolyn Daring   Number of children: 1   Years of education: 15   Highest education level: Some college, no degree  Occupational History   Not on file  Tobacco Use   Smoking status: Never    Passive exposure: Never  Smokeless tobacco: Never  Vaping Use   Vaping status: Never Used  Substance and Sexual Activity   Alcohol use: No   Drug use: Not Currently    Frequency: 4.0 times per week    Types: Marijuana   Sexual activity: Not Currently    Birth control/protection: Abstinence, None, Condom  Other Topics Concern   Not on file  Social History Narrative   Right handed   Caffeine -occasionally   Lives with family   Social Drivers of Health   Financial Resource Strain: Medium Risk (09/20/2023)   Overall Financial Resource Strain (CARDIA)    Difficulty of Paying Living Expenses: Somewhat hard  Food Insecurity: Food Insecurity Present (09/20/2023)   Hunger Vital Sign    Worried About Running Out of Food in the Last Year: Often true    Ran Out of Food in the Last Year: Often true  Transportation Needs: Unmet Transportation Needs (09/20/2023)   PRAPARE - Administrator, Civil Service (Medical): Yes    Lack of Transportation (Non-Medical): No  Physical Activity: Sufficiently Active (09/20/2023)   Exercise  Vital Sign    Days of Exercise per Week: 4 days    Minutes of Exercise per Session: 40 min  Stress: No Stress Concern Present (09/20/2023)   [employer] of Occupational Health - Occupational Stress Questionnaire    Feeling of Stress : Only a little  Social Connections: Moderately Isolated (09/20/2023)   Social Connection and Isolation Panel    Frequency of Communication with Friends and Family: More than three times a week    Frequency of Social Gatherings with Friends and Family: Once a week    Attends Religious Services: Never    Database administrator or Organizations: Yes    Attends Banker Meetings: Never    Marital Status: Never married  Intimate Partner Violence: Not At Risk (09/20/2023)   Humiliation, Afraid, Rape, and Kick questionnaire    Fear of Current or Ex-Partner: No    Emotionally Abused: No    Physically Abused: No    Sexually Abused: No   PHYSICAL EXAM  Vitals:   09/02/24 1343  BP: 122/76  Pulse: 81  Resp: 17  SpO2: 98%  Weight: 110 lb 8 oz (50.1 kg)  Height: 5' 4 (1.626 m)     Body mass index is 18.97 kg/m.  Generalized: Well developed, in no acute distress   Neurological examination  Mentation: Alert oriented to time, place, history taking. Follows all commands speech and language fluent Cranial nerve II-XII: Pupils were equal round reactive to light. Extraocular movements were full, visual field were full on confrontational test. Facial sensation and strength were normal.  Head turning and shoulder shrug  were normal and symmetric. Motor: The motor testing reveals 5 over 5 strength of all 4 extremities. Good symmetric motor tone is noted throughout.  Sensory: Sensory testing is intact to soft touch on all 4 extremities. No evidence of extinction is noted.  Coordination: Cerebellar testing reveals good finger-nose-finger and heel-to-shin bilaterally.  Gait and station: Gait is normal.  Reflexes: Deep tendon reflexes are symmetric  and normal bilaterally.   DIAGNOSTIC DATA (LABS, IMAGING, TESTING) - I reviewed patient records, labs, notes, testing and imaging myself where available.  Lab Results  Component Value Date   WBC 12.9 (H) 11/01/2022   HGB 11.2 (L) 11/01/2022   HCT 35.7 (L) 11/01/2022   MCV 89.0 11/01/2022   PLT 309 11/01/2022      Component Value  Date/Time   NA 139 08/14/2023 1608   K 4.1 08/14/2023 1608   CL 101 08/14/2023 1608   CO2 24 08/14/2023 1608   GLUCOSE 98 08/14/2023 1608   GLUCOSE 132 (H) 11/01/2022 2217   BUN 9 08/14/2023 1608   CREATININE 0.74 08/14/2023 1608   CALCIUM 9.8 08/14/2023 1608   PROT 7.3 11/01/2022 2217   ALBUMIN 4.2 11/01/2022 2217   AST 36 11/01/2022 2217   ALT 18 11/01/2022 2217   ALKPHOS 65 11/01/2022 2217   BILITOT 0.6 11/01/2022 2217   GFRNONAA >60 11/01/2022 2217   GFRAA >60 05/24/2020 1616   No results found for: CHOL, HDL, LDLCALC, LDLDIRECT, TRIG, CHOLHDL No results found for: YHAJ8R No results found for: VITAMINB12 No results found for: TSH  ASSESSMENT AND PLAN 33 y.o. year old female  has a past medical history of Common migraine with intractable migraine (09/19/2018), Headache, Marijuana abuse, and Seizures (HCC) (12/2017). here with:  1. Partial idiopathic epilepsy with seizures of localized onset, intractable, without status epilepticus (HCC)   2. Therapeutic drug monitoring      Patient Instructions  Continue with lamotrigine  100 mg twice daily Will check lamotrigine  level Please contact me if you have breakthrough seizure Please contact me if the left-sided weakness is getting worse or if you develop any type of pain or numbness. Follow-up in 1 year or sooner if worse     Pastor Falling, MD 09/02/2024, 2:06 PM [facility] [address] [place], [place] [address] [phone]

## 2024-09-02 NOTE — Patient Instructions (Addendum)
 Continue with lamotrigine  100 mg twice daily Will check lamotrigine  level Please contact me if you have breakthrough seizure Please contact me if the left-sided weakness is getting worse or if you develop any type of pain or numbness. Follow-up in 1 year or sooner if worse

## 2024-09-03 ENCOUNTER — Ambulatory Visit: Payer: Self-pay | Admitting: Neurology

## 2024-09-03 LAB — LAMOTRIGINE LEVEL: Lamotrigine Lvl: 5.5 ug/mL (ref 2.0–20.0)

## 2024-09-03 LAB — BASIC METABOLIC PANEL WITH GFR
BUN/Creatinine Ratio: 9 (ref 9–23)
BUN: 7 mg/dL (ref 6–20)
CO2: 25 mmol/L (ref 20–29)
Calcium: 9.7 mg/dL (ref 8.7–10.2)
Chloride: 102 mmol/L (ref 96–106)
Creatinine, Ser: 0.81 mg/dL (ref 0.57–1.00)
Glucose: 79 mg/dL (ref 70–99)
Potassium: 4.1 mmol/L (ref 3.5–5.2)
Sodium: 139 mmol/L (ref 134–144)
eGFR: 99 mL/min/1.73 (ref >59)

## 2025-09-08 ENCOUNTER — Ambulatory Visit: Admitting: Neurology
